# Patient Record
Sex: Male | Born: 1950 | Race: White | Hispanic: No | Marital: Single | Smoking: Former smoker
Health system: Southern US, Community
[De-identification: ages and names within clinical notes are randomized; demographics above are authoritative.]

## PROBLEM LIST (undated history)

## (undated) DIAGNOSIS — F1910 Other psychoactive substance abuse, uncomplicated: Secondary | ICD-10-CM

## (undated) DIAGNOSIS — T7840XA Allergy, unspecified, initial encounter: Secondary | ICD-10-CM

## (undated) DIAGNOSIS — M170 Bilateral primary osteoarthritis of knee: Secondary | ICD-10-CM

## (undated) DIAGNOSIS — I2510 Atherosclerotic heart disease of native coronary artery without angina pectoris: Secondary | ICD-10-CM

## (undated) DIAGNOSIS — B20 Human immunodeficiency virus [HIV] disease: Secondary | ICD-10-CM

## (undated) DIAGNOSIS — J4520 Mild intermittent asthma, uncomplicated: Secondary | ICD-10-CM

## (undated) DIAGNOSIS — J45909 Unspecified asthma, uncomplicated: Secondary | ICD-10-CM

## (undated) DIAGNOSIS — M84352A Stress fracture, left femur, initial encounter for fracture: Secondary | ICD-10-CM

## (undated) DIAGNOSIS — I219 Acute myocardial infarction, unspecified: Secondary | ICD-10-CM

## (undated) DIAGNOSIS — E785 Hyperlipidemia, unspecified: Secondary | ICD-10-CM

## (undated) DIAGNOSIS — F1510 Other stimulant abuse, uncomplicated: Secondary | ICD-10-CM

## (undated) DIAGNOSIS — Z21 Asymptomatic human immunodeficiency virus [HIV] infection status: Secondary | ICD-10-CM

## (undated) DIAGNOSIS — K4090 Unilateral inguinal hernia, without obstruction or gangrene, not specified as recurrent: Principal | ICD-10-CM

## (undated) HISTORY — DX: Atherosclerotic heart disease of native coronary artery without angina pectoris: I25.10

## (undated) HISTORY — DX: Bilateral primary osteoarthritis of knee: M17.0

## (undated) HISTORY — DX: Acute myocardial infarction, unspecified: I21.9

## (undated) HISTORY — DX: Unspecified asthma, uncomplicated: J45.909

## (undated) HISTORY — DX: Other psychoactive substance abuse, uncomplicated: F19.10

## (undated) HISTORY — DX: Asymptomatic human immunodeficiency virus (hiv) infection status: Z21

## (undated) HISTORY — PX: HERNIA REPAIR: SHX51

## (undated) HISTORY — PX: TONSILLECTOMY: SUR1361

## (undated) HISTORY — DX: Mild intermittent asthma, uncomplicated: J45.20

## (undated) HISTORY — DX: Hyperlipidemia, unspecified: E78.5

## (undated) HISTORY — DX: Other stimulant abuse, uncomplicated: F15.10

## (undated) HISTORY — DX: Human immunodeficiency virus (HIV) disease: B20

## (undated) HISTORY — DX: Allergy, unspecified, initial encounter: T78.40XA

## (undated) HISTORY — DX: Unilateral inguinal hernia, without obstruction or gangrene, not specified as recurrent: K40.90

---
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
## 2002-12-11 DIAGNOSIS — I219 Acute myocardial infarction, unspecified: Secondary | ICD-10-CM

## 2002-12-11 HISTORY — DX: Acute myocardial infarction, unspecified: I21.9

## 2002-12-11 HISTORY — PX: BYPASS GRAFT: SHX909

## 2002-12-11 HISTORY — PX: CORONARY ARTERY BYPASS GRAFT: SHX141

## 2011-01-11 HISTORY — PX: COLONOSCOPY: SHX174

## 2011-01-19 ENCOUNTER — Ambulatory Visit (HOSPITAL_COMMUNITY)
Admission: RE | Admit: 2011-01-19 | Discharge: 2011-01-19 | Disposition: A | Discharge status: Home / Self Care | Payer: BC Managed Care – PPO | Source: Ambulatory Visit | Attending: Gastroenterology | Admitting: Gastroenterology

## 2011-01-19 DIAGNOSIS — I252 Old myocardial infarction: Secondary | ICD-10-CM | POA: Insufficient documentation

## 2011-01-19 DIAGNOSIS — Z951 Presence of aortocoronary bypass graft: Secondary | ICD-10-CM | POA: Insufficient documentation

## 2011-01-19 DIAGNOSIS — E7800 Pure hypercholesterolemia, unspecified: Secondary | ICD-10-CM | POA: Insufficient documentation

## 2011-01-19 DIAGNOSIS — Z1211 Encounter for screening for malignant neoplasm of colon: Secondary | ICD-10-CM | POA: Insufficient documentation

## 2011-01-19 DIAGNOSIS — Z800 Family history of malignant neoplasm of digestive organs: Secondary | ICD-10-CM | POA: Insufficient documentation

## 2011-01-19 DIAGNOSIS — I10 Essential (primary) hypertension: Secondary | ICD-10-CM | POA: Insufficient documentation

## 2011-01-19 LAB — HM COLONOSCOPY

## 2011-01-24 NOTE — Op Note (Signed)
  Edwin Padilla, STARY NO.:  [id number]  MEDICAL RECORD NO.:  [id number]           PATIENT TYPE:  O  LOCATION:  [facility]                         FACILITY:  [facility]  PHYSICIAN:  Danise Edge, M.D.   DATE OF BIRTH:  01-07-1951  DATE OF PROCEDURE:  01/19/2011 DATE OF DISCHARGE:                              OPERATIVE REPORT   REFERRING PHYSICIAN:  Elby Showers, MD.  HISTORY:  Mr. Shaman Muscarella is a 60 year old male who is scheduled to undergo his first screening colonoscopy with polypectomy to prevent colon cancer.  ENDOSCOPIST:  Danise Edge, M.D.  PREMEDICATIONS: 1. Fentanyl 100 mcg. 2. Versed 7 mg.  PROCEDURE:  After obtaining informed consent, the patient was placed in the left lateral decubitus position.  I administered intravenous fentanyl and intravenous Versed to achieve conscious sedation for the procedure.  The patient's blood pressure, oxygen saturation and cardiac rhythm were monitored throughout the procedure and documented in the medical record.  Anal inspection and digital rectal exam were normal.  The Pentax pediatric colonoscope was introduced into the rectum and easily advanced to the cecum.  A normal-appearing appendiceal orifice and ileocecal valve were identified.  Colonic preparation for the exam today was good.  Rectum normal.  Retroflexed view of the distal rectum normal.  Sigmoid colon and descending colon normal.  Splenic flexure normal.  Transverse colon normal.  Hepatic flexure normal.  Ascending colon normal.  Cecum and ileocecal valve normal.  ASSESSMENT:  Normal screening proctocolonoscopy to the cecum.  RECOMMENDATIONS:  Schedule repeat screening colonoscopy in 10 years.          ______________________________ Danise Edge, M.D.     MJ/MEDQ  D:  01/19/2011  T:  01/19/2011  Job:  [id number]  cc:   Elby Showers, MD Fax: [phone]  Electronically Signed by Danise Edge M.D. on 01/24/2011 02:38:26 PM

## 2011-08-03 ENCOUNTER — Ambulatory Visit (INDEPENDENT_AMBULATORY_CARE_PROVIDER_SITE_OTHER): Payer: BC Managed Care – PPO

## 2011-08-03 DIAGNOSIS — Z111 Encounter for screening for respiratory tuberculosis: Secondary | ICD-10-CM

## 2011-08-03 DIAGNOSIS — Z23 Encounter for immunization: Secondary | ICD-10-CM

## 2011-08-03 DIAGNOSIS — B20 Human immunodeficiency virus [HIV] disease: Secondary | ICD-10-CM

## 2011-08-03 LAB — LIPID PANEL
Cholesterol: 165 mg/dL (ref 0–200)
HDL: 53 mg/dL (ref >39)
Triglycerides: 159 mg/dL — ABNORMAL HIGH (ref <150)

## 2011-08-03 LAB — URINALYSIS, ROUTINE W REFLEX MICROSCOPIC
Leukocytes, UA: NEGATIVE
Nitrite: NEGATIVE
Specific Gravity, Urine: 1.028 (ref 1.005–1.030)
Urobilinogen, UA: 0.2 mg/dL (ref 0.0–1.0)
pH: 6 (ref 5.0–8.0)

## 2011-08-03 LAB — COMPLETE METABOLIC PANEL WITH GFR
ALT: 29 U/L (ref 0–53)
AST: 23 U/L (ref 0–37)
Albumin: 4.5 g/dL (ref 3.5–5.2)
BUN: 16 mg/dL (ref 6–23)
Calcium: 9.7 mg/dL (ref 8.4–10.5)
Chloride: 104 mEq/L (ref 96–112)
Potassium: 4.3 mEq/L (ref 3.5–5.3)
Sodium: 139 mEq/L (ref 135–145)
Total Protein: 6.8 g/dL (ref 6.0–8.3)

## 2011-08-03 LAB — HEPATITIS B SURFACE ANTIBODY,QUALITATIVE

## 2011-08-04 LAB — GC/CHLAMYDIA PROBE AMP, URINE
Chlamydia, Swab/Urine, PCR: NEGATIVE
GC Probe Amp, Urine: NEGATIVE

## 2011-08-04 LAB — T-HELPER CELL (CD4) - (RCID CLINIC ONLY)
CD4 % Helper T Cell: 40 % (ref 33–55)
CD4 T Cell Abs: 1080 uL (ref 400–2700)

## 2011-08-04 LAB — HIV-1 RNA ULTRAQUANT REFLEX TO GENTYP+: HIV 1 RNA Quant: <20 copies/mL (ref <20)

## 2011-08-16 NOTE — Progress Notes (Signed)
Previous ID patient at [facility]. He is transferring since he lives in [place].   Taking Atripla 2005 , states he is very compliant.  Upon initial diagnosis pt was enrolled in a study for 2 years.

## 2011-08-28 ENCOUNTER — Encounter: Payer: Self-pay | Admitting: Infectious Disease

## 2011-08-28 ENCOUNTER — Ambulatory Visit (INDEPENDENT_AMBULATORY_CARE_PROVIDER_SITE_OTHER): Payer: BC Managed Care – PPO | Admitting: Infectious Disease

## 2011-08-28 DIAGNOSIS — F1510 Other stimulant abuse, uncomplicated: Secondary | ICD-10-CM | POA: Insufficient documentation

## 2011-08-28 DIAGNOSIS — B20 Human immunodeficiency virus [HIV] disease: Secondary | ICD-10-CM | POA: Insufficient documentation

## 2011-08-28 DIAGNOSIS — E785 Hyperlipidemia, unspecified: Secondary | ICD-10-CM

## 2011-08-28 DIAGNOSIS — Z21 Asymptomatic human immunodeficiency virus [HIV] infection status: Secondary | ICD-10-CM

## 2011-08-28 DIAGNOSIS — IMO0001 Reserved for inherently not codable concepts without codable children: Secondary | ICD-10-CM

## 2011-08-28 HISTORY — DX: Other stimulant abuse, uncomplicated: F15.10

## 2011-08-28 NOTE — Progress Notes (Signed)
Subjective:    Patient ID: Edwin Padilla, male    DOB: [birth year], 60 y.o.   MRN: [id number]  HPI  60 year old with HIV diagnosed in 2003 in [place], [place] hospital with acute HIV. He was put into a study and begun on immediate  Therapy with treatment interupption and then with high viral road rebound immediately upon stopping the meds. He was on a complicated regimen. He has been on [place] then [place] since 2005 with suppressed viral load, and high cd4 count. He never was able to get the "dreams to go away" from [employer] and he continues to have poor sleep. He also is distressed by the financial aspects. He has deductible of 5k with current plan and 50% copay on med s until he meets his deductible. 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 He was recently released from [employer] prison for selling methamphetamines. He is in [place] and been free of drugs includign tobacco. He  has been following closely with Dr. Clent Ridges. We reviewed other single tablet regimens including complera and stribild today but he wished to stay with atripla for now. We reviewed all llabs including liipids and he was thrilled to see his ldl at 80. He is living alone and is not sexually active. We spent greater than 45 minutes including greater than 50% of time in face to face counselling of the patient Review of Systems  Constitutional: Negative for fever, chills, diaphoresis, activity change, appetite change, fatigue and unexpected weight change.  HENT: Negative for congestion, sore throat, rhinorrhea, sneezing, trouble swallowing and sinus pressure.   Eyes: Negative for photophobia and visual disturbance.  Respiratory: Negative for cough, chest tightness, shortness of breath, wheezing and stridor.   Cardiovascular: Negative for chest pain, palpitations and leg swelling.  Gastrointestinal: Negative for nausea, vomiting, abdominal pain, diarrhea, constipation, blood in stool, abdominal  distention and anal bleeding.  Genitourinary: Negative for dysuria, hematuria, flank pain and difficulty urinating.  Musculoskeletal: Negative for myalgias, back pain, joint swelling, arthralgias and gait problem.  Skin: Negative for color change, pallor, rash and wound.  Neurological: Negative for dizziness, tremors, weakness and light-headedness.  Hematological: Negative for adenopathy. Does not bruise/bleed easily.  Psychiatric/Behavioral: Negative for behavioral problems, confusion, sleep disturbance, dysphoric mood, decreased concentration and agitation.       Objective:   Physical Exam  Constitutional: He is oriented to person, place, and time. He appears well-developed and well-nourished. No distress.  HENT:  Head: Normocephalic and atraumatic.  Mouth/Throat: Oropharynx is clear and moist. No oropharyngeal exudate.  Eyes: Conjunctivae and EOM are normal. Pupils are equal, round, and reactive to light. No scleral icterus.  Neck: Normal range of motion. Neck supple. No JVD present.  Cardiovascular: Normal rate, regular rhythm and normal heart sounds.  Exam reveals no gallop and no friction rub.   No murmur heard. Pulmonary/Chest: Effort normal and breath sounds normal. No respiratory distress. He has no wheezes. He has no rales. He exhibits no tenderness.  Abdominal: He exhibits no distension and no mass. There is no tenderness. There is no rebound and no guarding.  Musculoskeletal: He exhibits no edema and no tenderness.  Lymphadenopathy:    He has no cervical adenopathy.  Neurological: He is alert and oriented to person, place, and time. He has normal reflexes. He exhibits normal muscle tone. Coordination normal.  Skin: Skin is warm and dry. He is not diaphoretic. No erythema. No pallor.  Psychiatric: He  has a normal mood and affect. His behavior is normal. Judgment and thought content normal.          Assessment & Plan:  HIV (human immunodeficiency virus  infection) Continue atripla will consider other single tablet regimens in future if he remains interested  Hyperlipidemia Well controlled. Efavirenz does decrease the levels of simvastatin  Methamphetamine abuse In remission  Incarceration Seems to be doing very well and well adjusted post protracted stay in prison

## 2011-08-28 NOTE — Assessment & Plan Note (Signed)
In remission.

## 2011-08-28 NOTE — Assessment & Plan Note (Signed)
Seems to be doing very well and well adjusted post protracted stay in prison

## 2011-08-28 NOTE — Assessment & Plan Note (Signed)
Well controlled. Efavirenz does decrease the levels of simvastatin

## 2011-08-28 NOTE — Assessment & Plan Note (Signed)
Continue Edwin Padilla will consider other single tablet regimens in future if he remains interested

## 2012-05-14 ENCOUNTER — Telehealth: Payer: Self-pay | Admitting: *Deleted

## 2012-05-14 NOTE — Telephone Encounter (Signed)
I left him a message asking him to call & make an appointment

## 2012-05-14 NOTE — Telephone Encounter (Signed)
He called back & agreed to make an appt. transferred to [facility]

## 2012-07-09 ENCOUNTER — Ambulatory Visit (INDEPENDENT_AMBULATORY_CARE_PROVIDER_SITE_OTHER): Payer: BC Managed Care – PPO | Admitting: Surgery

## 2012-07-09 ENCOUNTER — Encounter (INDEPENDENT_AMBULATORY_CARE_PROVIDER_SITE_OTHER): Payer: Self-pay | Admitting: Surgery

## 2012-07-09 VITALS — BP 110/62 | HR 60 | Temp 98.2°F | Resp 16 | Ht 70.0 in | Wt 159.6 lb

## 2012-07-09 DIAGNOSIS — K4090 Unilateral inguinal hernia, without obstruction or gangrene, not specified as recurrent: Secondary | ICD-10-CM | POA: Insufficient documentation

## 2012-07-09 NOTE — Progress Notes (Signed)
[name] DOB: February 23, 1951 MRN: [id number]                                                                                      DATE: 07/09/2012  PCP: Elby Showers, MD Referring Provider: Elby Showers, MD  IMPRESSION:  Right inguinal hernia, currently asymptomatic, reducible, probably direct.  PLAN:   I reviewed the alternatives with him. I don't feel strongly that it needs to be repaired currently I did tell him that there is a small risk of incarceration. I told him that if it enlarges or becomes painful we should Definitely consider surgery. I suggested that he do all his normal activities and did determine if the hernia is giving him enough symptoms to warrant treatment. He is using a brace.   CC:  Chief Complaint  Patient presents with  . Other    eval rt ing hernia    HPI:  Edwin Padilla is a 61 y.o.  male who presents for evaluation of Right inguinal hernia. He thinks it developed when he had bronchitis a few months ago and was doing a lot of coughing. There is intermittently a bulge but it easily reduces when he lays back down. There is no pain. He hasn't had any problems with any of his activities although he has avoided playing tennis for a while.  PMH:  has a past medical history of Allergy; Coronary atherosclerosis of unspecified type of vessel, native or graft; Myocardial infarction (2004); HIV infection; Hyperlipidemia; Substance abuse; and Right inguinal hernia (07/09/2012).  PSH:   has past surgical history that includes Coronary artery bypass graft and Hernia repair.  ALLERGIES:  No Known Allergies  MEDICATIONS: Current outpatient prescriptions:aspirin 81 MG chewable tablet, Chew 81 mg by mouth daily.  , Disp: , Rfl: ;  ATRIPLA 600-200-300 MG per tablet, , Disp: , Rfl: ;  lisinopril (PRINIVIL,ZESTRIL) 5 MG tablet, , Disp: , Rfl: ;  pravastatin (PRAVACHOL) 40 MG tablet, Take 40 mg by mouth Once daily., Disp: , Rfl: ;  PROCTOZONE-HC 2.5 % rectal cream, , Disp: ,  Rfl: ;  simvastatin (ZOCOR) 40 MG tablet, , Disp: , Rfl:   ROS: He has filled out our 12 point review of systems and it is negative. EXAM:   . Vital signs BP 110/62  Pulse 60  Temp 98.2 F (36.8 C) (Temporal)  Resp 16  Ht 5\' 10"  (1.778 m)  Wt 159 lb 9.6 oz (72.394 kg)  BMI 22.90 kg/m2 General: The patient is thin but healthy-appearing, no acute distress Abdomen: He has a right inguinal hernia, but no obvious pulse 90 standing up but palpable on exam. There is no left femoral hernia. Testes appear normal. No other abnormality in the abdomen noted.  DATA REVIEWED:  I reviewed over the notes from his primary care physician which are now scanned into Epic    Lylah Lantis J 07/09/2012  CC: Elby Showers, MD, Elby Showers, MD

## 2012-07-09 NOTE — Patient Instructions (Signed)
I think you should have normal activities with no restrictions for now. If the hernia then  Begins to give you some discomfort or seems to be getting larger then you should call me and we should discuss again having a surgical repair.

## 2012-08-01 ENCOUNTER — Other Ambulatory Visit (INDEPENDENT_AMBULATORY_CARE_PROVIDER_SITE_OTHER): Payer: BC Managed Care – PPO | Admitting: Infectious Disease

## 2012-08-01 ENCOUNTER — Other Ambulatory Visit: Payer: BC Managed Care – PPO

## 2012-08-01 DIAGNOSIS — B20 Human immunodeficiency virus [HIV] disease: Secondary | ICD-10-CM

## 2012-08-01 DIAGNOSIS — Z113 Encounter for screening for infections with a predominantly sexual mode of transmission: Secondary | ICD-10-CM

## 2012-08-01 LAB — COMPLETE METABOLIC PANEL WITH GFR
AST: 19 U/L (ref 0–37)
Albumin: 3.9 g/dL (ref 3.5–5.2)
Alkaline Phosphatase: 70 U/L (ref 39–117)
BUN: 20 mg/dL (ref 6–23)
Calcium: 9.1 mg/dL (ref 8.4–10.5)
Chloride: 106 mEq/L (ref 96–112)
Potassium: 4.6 mEq/L (ref 3.5–5.3)
Sodium: 140 mEq/L (ref 135–145)
Total Protein: 6.3 g/dL (ref 6.0–8.3)

## 2012-08-01 LAB — CBC WITH DIFFERENTIAL/PLATELET
Basophils Absolute: 0 10*3/uL (ref 0.0–0.1)
Basophils Relative: 0 % (ref 0–1)
HCT: 39.2 % (ref 39.0–52.0)
Hemoglobin: 14 g/dL (ref 13.0–17.0)
Lymphocytes Relative: 37 % (ref 12–46)
MCHC: 35.7 g/dL (ref 30.0–36.0)
Monocytes Absolute: 0.6 10*3/uL (ref 0.1–1.0)
Monocytes Relative: 9 % (ref 3–12)
Neutro Abs: 2.9 10*3/uL (ref 1.7–7.7)
Neutrophils Relative %: 47 % (ref 43–77)
WBC: 6.3 10*3/uL (ref 4.0–10.5)

## 2012-08-02 LAB — T-HELPER CELL (CD4) - (RCID CLINIC ONLY)
CD4 % Helper T Cell: 35 % (ref 33–55)
CD4 T Cell Abs: 820 uL (ref 400–2700)

## 2012-08-07 ENCOUNTER — Ambulatory Visit: Payer: BC Managed Care – PPO | Admitting: Infectious Disease

## 2012-08-15 ENCOUNTER — Ambulatory Visit (INDEPENDENT_AMBULATORY_CARE_PROVIDER_SITE_OTHER): Payer: BC Managed Care – PPO | Admitting: Infectious Disease

## 2012-08-15 ENCOUNTER — Encounter: Payer: Self-pay | Admitting: Infectious Disease

## 2012-08-15 VITALS — BP 115/72 | HR 57 | Temp 97.5°F | Ht 70.0 in | Wt 163.5 lb

## 2012-08-15 DIAGNOSIS — I25810 Atherosclerosis of coronary artery bypass graft(s) without angina pectoris: Secondary | ICD-10-CM

## 2012-08-15 DIAGNOSIS — Z21 Asymptomatic human immunodeficiency virus [HIV] infection status: Secondary | ICD-10-CM

## 2012-08-15 DIAGNOSIS — B20 Human immunodeficiency virus [HIV] disease: Secondary | ICD-10-CM

## 2012-08-15 NOTE — Assessment & Plan Note (Signed)
On an aspirin and ACE inhibitor while his pravastatin is currently being held.

## 2012-08-15 NOTE — Assessment & Plan Note (Signed)
Perfect control 

## 2012-08-15 NOTE — Progress Notes (Signed)
  Subjective:    Patient ID: Edwin Padilla, male    DOB: 1951/03/20, 61 y.o.   MRN: [id number]  HPI  [name] is a 61 y.o. male who is doing superbly well on his  antiviral regimen, on atripla with undetectable viral load and health cd4 count. He is seeing Elby Showers for primary care and is currently off of his pravastatin to see if this may normalize some myalgias and what muscle weakness and easy been experiencing. He remains on lisinopril though his blood pressures are normal. He states that he was told he needed to remain on this drug after he underwent coronary artery bypass grafting in [place]. He is taking an aspirin. He claims he is not sexually active and not been so for several years. I recommended him having repeat hepatitis A vaccination given that his serologies were negative here at [facility].   Review of Systems  Constitutional: Negative for fever, chills, diaphoresis, activity change, appetite change, fatigue and unexpected weight change.  HENT: Negative for congestion, sore throat, rhinorrhea, sneezing, trouble swallowing and sinus pressure.   Eyes: Negative for photophobia and visual disturbance.  Respiratory: Negative for cough, chest tightness, shortness of breath, wheezing and stridor.   Cardiovascular: Negative for chest pain, palpitations and leg swelling.  Gastrointestinal: Negative for nausea, vomiting, abdominal pain, diarrhea, constipation, blood in stool, abdominal distention and anal bleeding.  Genitourinary: Negative for dysuria, hematuria, flank pain and difficulty urinating.  Musculoskeletal: Negative for myalgias, back pain, joint swelling, arthralgias and gait problem.  Skin: Negative for color change, pallor, rash and wound.  Neurological: Positive for weakness. Negative for dizziness, tremors and light-headedness.  Hematological: Negative for adenopathy. Does not bruise/bleed easily.  Psychiatric/Behavioral: Negative for behavioral problems,  confusion, disturbed wake/sleep cycle, dysphoric mood, decreased concentration and agitation.       Objective:   Physical Exam  Constitutional: He is oriented to person, place, and time. He appears well-developed and well-nourished. No distress.  HENT:  Head: Normocephalic and atraumatic.  Mouth/Throat: Oropharynx is clear and moist. No oropharyngeal exudate.  Eyes: Conjunctivae and EOM are normal. Pupils are equal, round, and reactive to light. No scleral icterus.  Neck: Normal range of motion. Neck supple. No JVD present.  Cardiovascular: Normal rate, regular rhythm and normal heart sounds.  Exam reveals no gallop and no friction rub.   No murmur heard. Pulmonary/Chest: Effort normal and breath sounds normal. No respiratory distress. He has no wheezes. He has no rales. He exhibits no tenderness.  Abdominal: He exhibits no distension and no mass. There is no tenderness. There is no rebound and no guarding.  Musculoskeletal: He exhibits no edema and no tenderness.  Lymphadenopathy:    He has no cervical adenopathy.  Neurological: He is alert and oriented to person, place, and time. He has normal reflexes. He exhibits normal muscle tone. Coordination normal.  Skin: Skin is warm and dry. He is not diaphoretic. No erythema. No pallor.  Psychiatric: He has a normal mood and affect. His behavior is normal. Judgment and thought content normal.          Assessment & Plan:  HIV (human immunodeficiency virus infection) Perfect control  CAD (coronary artery disease) of artery bypass graft On an aspirin and ACE inhibitor while his pravastatin is currently being held.

## 2012-09-04 DIAGNOSIS — Z23 Encounter for immunization: Secondary | ICD-10-CM

## 2013-01-29 ENCOUNTER — Other Ambulatory Visit: Payer: Self-pay

## 2013-01-29 ENCOUNTER — Other Ambulatory Visit: Payer: Self-pay | Admitting: Infectious Disease

## 2013-01-29 ENCOUNTER — Other Ambulatory Visit (INDEPENDENT_AMBULATORY_CARE_PROVIDER_SITE_OTHER): Payer: BC Managed Care – PPO

## 2013-01-29 DIAGNOSIS — B20 Human immunodeficiency virus [HIV] disease: Secondary | ICD-10-CM

## 2013-01-29 DIAGNOSIS — Z23 Encounter for immunization: Secondary | ICD-10-CM

## 2013-01-29 DIAGNOSIS — Z21 Asymptomatic human immunodeficiency virus [HIV] infection status: Secondary | ICD-10-CM

## 2013-01-29 LAB — LIPID PANEL
Cholesterol: 187 mg/dL (ref 0–200)
LDL Cholesterol: 117 mg/dL — ABNORMAL HIGH (ref 0–99)
Total CHOL/HDL Ratio: 3.7 Ratio
VLDL: 20 mg/dL (ref 0–40)

## 2013-01-29 LAB — COMPLETE METABOLIC PANEL WITH GFR
ALT: 33 U/L (ref 0–53)
AST: 21 U/L (ref 0–37)
Alkaline Phosphatase: 72 U/L (ref 39–117)
Creat: 0.9 mg/dL (ref 0.50–1.35)
GFR, Est African American: >89 mL/min
Sodium: 137 mEq/L (ref 135–145)
Total Bilirubin: 0.3 mg/dL (ref 0.3–1.2)
Total Protein: 6.2 g/dL (ref 6.0–8.3)

## 2013-01-29 LAB — CBC WITH DIFFERENTIAL/PLATELET
Eosinophils Absolute: 0.5 10*3/uL (ref 0.0–0.7)
HCT: 39.7 % (ref 39.0–52.0)
Hemoglobin: 14.2 g/dL (ref 13.0–17.0)
Lymphs Abs: 2.2 10*3/uL (ref 0.7–4.0)
MCH: 31.3 pg (ref 26.0–34.0)
MCV: 87.6 fL (ref 78.0–100.0)
Monocytes Absolute: 0.7 10*3/uL (ref 0.1–1.0)
Monocytes Relative: 9 % (ref 3–12)
Neutrophils Relative %: 55 % (ref 43–77)
RBC: 4.53 MIL/uL (ref 4.22–5.81)

## 2013-01-30 LAB — T-HELPER CELL (CD4) - (RCID CLINIC ONLY)
CD4 % Helper T Cell: 36 % (ref 33–55)
CD4 T Cell Abs: 830 uL (ref 400–2700)

## 2013-01-30 LAB — RPR

## 2013-01-31 LAB — HIV-1 RNA QUANT-NO REFLEX-BLD
HIV 1 RNA Quant: <20 copies/mL (ref <20)
HIV-1 RNA Quant, Log: <1.3 {Log} (ref <1.30)

## 2013-02-05 ENCOUNTER — Encounter: Payer: Self-pay | Admitting: Infectious Disease

## 2013-02-12 ENCOUNTER — Ambulatory Visit: Payer: Self-pay | Admitting: Infectious Disease

## 2013-02-12 ENCOUNTER — Ambulatory Visit: Payer: BC Managed Care – PPO | Admitting: Infectious Disease

## 2013-02-20 ENCOUNTER — Ambulatory Visit (INDEPENDENT_AMBULATORY_CARE_PROVIDER_SITE_OTHER): Payer: BC Managed Care – PPO | Admitting: Infectious Disease

## 2013-02-20 ENCOUNTER — Encounter: Payer: Self-pay | Admitting: Infectious Disease

## 2013-02-20 VITALS — BP 111/70 | HR 51 | Temp 98.1°F | Wt 164.8 lb

## 2013-02-20 DIAGNOSIS — I25810 Atherosclerosis of coronary artery bypass graft(s) without angina pectoris: Secondary | ICD-10-CM

## 2013-02-20 DIAGNOSIS — E7800 Pure hypercholesterolemia, unspecified: Secondary | ICD-10-CM

## 2013-02-20 DIAGNOSIS — Z951 Presence of aortocoronary bypass graft: Secondary | ICD-10-CM

## 2013-02-20 DIAGNOSIS — M791 Myalgia, unspecified site: Secondary | ICD-10-CM

## 2013-02-20 DIAGNOSIS — Z21 Asymptomatic human immunodeficiency virus [HIV] infection status: Secondary | ICD-10-CM

## 2013-02-20 DIAGNOSIS — I10 Essential (primary) hypertension: Secondary | ICD-10-CM

## 2013-02-20 DIAGNOSIS — B20 Human immunodeficiency virus [HIV] disease: Secondary | ICD-10-CM

## 2013-02-20 DIAGNOSIS — E785 Hyperlipidemia, unspecified: Secondary | ICD-10-CM

## 2013-02-20 DIAGNOSIS — IMO0001 Reserved for inherently not codable concepts without codable children: Secondary | ICD-10-CM

## 2013-02-20 MED ORDER — ATORVASTATIN CALCIUM 40 MG PO TABS: 40.0000 mg | ORAL_TABLET | Freq: Every day | ORAL | Status: DC

## 2013-02-20 NOTE — Patient Instructions (Addendum)
I would recommend Sharlot Gowda for an MD who is PCP and also has excellent experience with HIV  His practice address is :  [address] [place], [place] [address] [facility]: Ronnald Nian [id number] [place], [place] [address]  Phone number  [phone]  We would be happy to make referral to him for you

## 2013-02-20 NOTE — Progress Notes (Signed)
  Subjective:    Patient ID: Edwin Padilla, male    DOB: Feb 25, 1951, 62 y.o.   MRN: [id number]  HPI   Edwin Padilla is a 62 y.o. male who is doing superbly well on his  antiviral regimen, on atripla with undetectable viral load and health cd4 count. He was  seeing Elby Showers for primary care and was taken off of his pravastatin to see if this may normalize some myalgias when I last saw him but appears to be back on pravachol at present.  He was interested in a different PCP who is more familiar with HIV than Dr Nehemiah Settle towhom he was referred after Dr Clent Ridges had moved to [place].  I spent greater than 45 minutes with the patient including greater than 50% of time in face to face counsel of the patient and in coordination of their care.     Review of Systems  Constitutional: Negative for fever, chills, diaphoresis, activity change, appetite change, fatigue and unexpected weight change.  HENT: Negative for congestion, sore throat, rhinorrhea, sneezing, trouble swallowing and sinus pressure.   Eyes: Negative for photophobia and visual disturbance.  Respiratory: Negative for cough, chest tightness, shortness of breath, wheezing and stridor.   Cardiovascular: Negative for chest pain, palpitations and leg swelling.  Gastrointestinal: Negative for nausea, vomiting, abdominal pain, diarrhea, constipation, blood in stool, abdominal distention and anal bleeding.  Genitourinary: Negative for dysuria, hematuria, flank pain and difficulty urinating.  Musculoskeletal: Negative for myalgias, back pain, joint swelling, arthralgias and gait problem.  Skin: Negative for color change, pallor, rash and wound.  Neurological: Positive for weakness. Negative for dizziness, tremors and light-headedness.  Hematological: Negative for adenopathy. Does not bruise/bleed easily.  Psychiatric/Behavioral: Negative for behavioral problems, confusion, sleep disturbance, dysphoric mood, decreased concentration and agitation.        Objective:   Physical Exam  Constitutional: He is oriented to person, place, and time. He appears well-developed and well-nourished. No distress.  HENT:  Head: Normocephalic and atraumatic.  Mouth/Throat: Oropharynx is clear and moist. No oropharyngeal exudate.  Eyes: Conjunctivae and EOM are normal. Pupils are equal, round, and reactive to light. No scleral icterus.  Neck: Normal range of motion. Neck supple. No JVD present.  Cardiovascular: Normal rate, regular rhythm and normal heart sounds.  Exam reveals no gallop and no friction rub.   No murmur heard. Pulmonary/Chest: Effort normal and breath sounds normal. No respiratory distress. He has no wheezes. He has no rales. He exhibits no tenderness.  Abdominal: He exhibits no distension and no mass. There is no tenderness. There is no rebound and no guarding.  Musculoskeletal: He exhibits no edema and no tenderness.  Lymphadenopathy:    He has no cervical adenopathy.  Neurological: He is alert and oriented to person, place, and time. He has normal reflexes. He exhibits normal muscle tone. Coordination normal.  Skin: Skin is warm and dry. He is not diaphoretic. No erythema. No pallor.  Psychiatric: He has a normal mood and affect. His behavior is normal. Judgment and thought content normal.          Assessment & Plan:   HIV: continue atripla  CAD: wrote for lipitor 40mg , he is on asa and ACEI  HTN : well controlled  Hx of myalgias: will call and double check that he really has been back on the pravachol without problems

## 2013-04-04 ENCOUNTER — Ambulatory Visit (INDEPENDENT_AMBULATORY_CARE_PROVIDER_SITE_OTHER): Payer: BC Managed Care – PPO | Admitting: Surgery

## 2013-04-04 ENCOUNTER — Encounter (HOSPITAL_BASED_OUTPATIENT_CLINIC_OR_DEPARTMENT_OTHER): Payer: Self-pay | Admitting: *Deleted

## 2013-04-04 ENCOUNTER — Encounter (INDEPENDENT_AMBULATORY_CARE_PROVIDER_SITE_OTHER): Payer: Self-pay | Admitting: Surgery

## 2013-04-04 VITALS — BP 112/74 | HR 56 | Temp 98.0°F | Resp 18 | Ht 70.0 in | Wt 163.0 lb

## 2013-04-04 DIAGNOSIS — K4090 Unilateral inguinal hernia, without obstruction or gangrene, not specified as recurrent: Secondary | ICD-10-CM

## 2013-04-04 NOTE — Progress Notes (Signed)
Pt to come in for [id number]-sees dr Katrinka Blazing for CAD post op CABG 2004-no problems since-has not had ekg this yr. Called for notes Pt also see [facility] dr Finis Bud

## 2013-04-04 NOTE — Patient Instructions (Signed)
We will schedule outpatient surgery to repair your right inguinal hernia

## 2013-04-04 NOTE — Progress Notes (Signed)
NAME: Edwin Padilla       DOB: 09/16/1951           DATE: 04/04/2013       MRN:[id number]  CC:  Chief Complaint  Patient presents with  . Inguinal Hernia    reck ing hernia discuss surgery LOV 07/09/12    HPI: I saw this patient about a year ago for a right inguinal hernia. At the time he was essentially asymptomatic and elected to defer repair. However, he is now had some more discomfort and thinks getting slightly larger like to go ahead and have it fixed. He believes his medical condition otherwise is very stable. His has a history of coronary disease but has been stable from that standpoint and has no cardiac symptoms whatsoever.  EXAM:   VITAL SIGNS: BP 112/74  Pulse 56  Temp(Src) 98 F (36.7 C) (Oral)  Resp 18  Ht 5' 10" (1.778 m)  Wt 163 lb (73.936 kg)  BMI 23.39 kg/m2  GENERAL:  The patient is alert, oriented, and generally healthy-appearing, NAD. Mood and affect are normal.  HEENT:  The head is normocephalic, the eyes nonicteric, the pupils were round regular and equal. EOMs are normal. Pharynx normal. Dentition good.  NECK:  The neck is supple and there are no masses or thyromegaly.  LUNGS:  Normal respirations and clear to auscultation.  HEART:  Regular rhythm, with no murmurs rubs or gallops. Pulses are intact carotid dorsalis pedis and posterior tibial. No significant varicosities are noted.  ABDOMEN:  Soft, flat, and nontender. No masses or organomegaly is noted. No hernias are noted. Bowel sounds are normal.  GU:  Testes are normal bilaterally. He has a reducible right anal hernia which I believe is likely direct. There is no left inguinal hernia.  EXTREMITIES:  Good range of motion, no edema.  IMP: gradually enlarging, minimally symptomatic, reducible right ankle hernia; HIV positive; history of coronary artery disease stable  PLAN: but to ahead and schedule inguinal hernia repair. We will make arrange for him to have that done. I have discussed with the  patient the fact that he has an inguinal hernia and reviewed the pathophysiology of that diagnosis. I have given him educational materials about this. I discussed options for treatment including observation or repair and have discussed both laparoscopic and open inguinal hernia repairs as potential techniques. We have discussed the use of mesh. We have discussed risks of surgery including bleeding, infection, recurrence, postoperative pain and possible chronic groin pain, testicular injury, and potential issues with voiding. I believe the patient's questions have been answered and that he has a good understanding of the issues  I will check with his cardiologist to be sure no additional cardiac workup is needed.  Chelcee Korpi J 04/04/2013   

## 2013-04-07 ENCOUNTER — Encounter (HOSPITAL_BASED_OUTPATIENT_CLINIC_OR_DEPARTMENT_OTHER)
Admission: RE | Admit: 2013-04-07 | Discharge: 2013-04-07 | Disposition: A | Discharge status: Home / Self Care | Payer: BC Managed Care – PPO | Source: Ambulatory Visit | Attending: Surgery | Admitting: Surgery

## 2013-04-07 LAB — BASIC METABOLIC PANEL
BUN: 20 mg/dL (ref 6–23)
Calcium: 8.9 mg/dL (ref 8.4–10.5)
Creatinine, Ser: 0.89 mg/dL (ref 0.50–1.35)
GFR calc Af Amer: >90 mL/min (ref >90)
GFR calc non Af Amer: >90 mL/min (ref >90)
Glucose, Bld: 75 mg/dL (ref 70–99)
Potassium: 4.4 mEq/L (ref 3.5–5.1)

## 2013-04-09 ENCOUNTER — Encounter (HOSPITAL_BASED_OUTPATIENT_CLINIC_OR_DEPARTMENT_OTHER): Payer: Self-pay | Admitting: *Deleted

## 2013-04-09 ENCOUNTER — Ambulatory Visit (HOSPITAL_BASED_OUTPATIENT_CLINIC_OR_DEPARTMENT_OTHER): Payer: BC Managed Care – PPO | Admitting: Certified Registered Nurse Anesthetist

## 2013-04-09 ENCOUNTER — Encounter (HOSPITAL_BASED_OUTPATIENT_CLINIC_OR_DEPARTMENT_OTHER): Payer: Self-pay | Admitting: Certified Registered Nurse Anesthetist

## 2013-04-09 ENCOUNTER — Encounter (HOSPITAL_BASED_OUTPATIENT_CLINIC_OR_DEPARTMENT_OTHER)
Admission: RE | Disposition: A | Discharge status: Home / Self Care | Payer: Self-pay | Source: Ambulatory Visit | Attending: Surgery

## 2013-04-09 ENCOUNTER — Ambulatory Visit (HOSPITAL_BASED_OUTPATIENT_CLINIC_OR_DEPARTMENT_OTHER)
Admission: RE | Admit: 2013-04-09 | Discharge: 2013-04-09 | Disposition: A | Discharge status: Home / Self Care | Payer: BC Managed Care – PPO | Source: Ambulatory Visit | Attending: Surgery | Admitting: Surgery

## 2013-04-09 DIAGNOSIS — K4090 Unilateral inguinal hernia, without obstruction or gangrene, not specified as recurrent: Secondary | ICD-10-CM

## 2013-04-09 DIAGNOSIS — Z21 Asymptomatic human immunodeficiency virus [HIV] infection status: Secondary | ICD-10-CM | POA: Insufficient documentation

## 2013-04-09 DIAGNOSIS — I252 Old myocardial infarction: Secondary | ICD-10-CM | POA: Insufficient documentation

## 2013-04-09 DIAGNOSIS — I2510 Atherosclerotic heart disease of native coronary artery without angina pectoris: Secondary | ICD-10-CM | POA: Insufficient documentation

## 2013-04-09 HISTORY — PX: INGUINAL HERNIA REPAIR: SHX194

## 2013-04-09 SURGERY — REPAIR, HERNIA, INGUINAL, ADULT
Anesthesia: General | Site: Groin | Laterality: Right | Wound class: Clean

## 2013-04-09 MED ORDER — CEFAZOLIN SODIUM-DEXTROSE 2-3 GM-% IV SOLR
2.0000 g | INTRAVENOUS | Status: AC
  Administered 2013-04-09: 2 g via INTRAVENOUS

## 2013-04-09 MED ORDER — CHLORHEXIDINE GLUCONATE 4 % EX LIQD: 1.0000 "application " | Freq: Once | CUTANEOUS | Status: DC

## 2013-04-09 MED ORDER — PROPOFOL 10 MG/ML IV BOLUS
INTRAVENOUS | Status: DC | PRN
  Administered 2013-04-09: 150 mg via INTRAVENOUS

## 2013-04-09 MED ORDER — BACITRACIN 50000 UNITS IM SOLR
INTRAMUSCULAR | Status: AC
  Filled 2013-04-09: qty 1

## 2013-04-09 MED ORDER — BUPIVACAINE-EPINEPHRINE PF 0.5-1:200000 % IJ SOLN
INTRAMUSCULAR | Status: DC | PRN
  Administered 2013-04-09: 25 mL

## 2013-04-09 MED ORDER — ONDANSETRON HCL 4 MG/2ML IJ SOLN
INTRAMUSCULAR | Status: DC | PRN
  Administered 2013-04-09: 4 mg via INTRAVENOUS

## 2013-04-09 MED ORDER — LIDOCAINE HCL (CARDIAC) 20 MG/ML IV SOLN
INTRAVENOUS | Status: DC | PRN
  Administered 2013-04-09: 80 mg via INTRAVENOUS

## 2013-04-09 MED ORDER — OXYCODONE HCL 5 MG PO TABS
5.0000 mg | ORAL_TABLET | Freq: Once | ORAL | Status: AC
  Administered 2013-04-09: 5 mg via ORAL

## 2013-04-09 MED ORDER — BUPIVACAINE HCL (PF) 0.25 % IJ SOLN
INTRAMUSCULAR | Status: DC | PRN
  Administered 2013-04-09: 20 mL

## 2013-04-09 MED ORDER — DEXAMETHASONE SODIUM PHOSPHATE 4 MG/ML IJ SOLN
INTRAMUSCULAR | Status: DC | PRN
  Administered 2013-04-09: 10 mg via INTRAVENOUS

## 2013-04-09 MED ORDER — SODIUM CHLORIDE 0.9 % IV SOLN
INTRAVENOUS | Status: AC
  Filled 2013-04-09: qty 500

## 2013-04-09 MED ORDER — FENTANYL CITRATE 0.05 MG/ML IJ SOLN
INTRAMUSCULAR | Status: DC | PRN
  Administered 2013-04-09 (×4): 25 ug via INTRAVENOUS

## 2013-04-09 MED ORDER — LACTATED RINGERS IV SOLN
INTRAVENOUS | Status: DC
  Administered 2013-04-09 (×2): via INTRAVENOUS

## 2013-04-09 MED ORDER — OXYCODONE-ACETAMINOPHEN 5-325 MG PO TABS: 1.0000 | ORAL_TABLET | ORAL | Status: DC | PRN

## 2013-04-09 MED ORDER — FENTANYL CITRATE 0.05 MG/ML IJ SOLN
50.0000 ug | INTRAMUSCULAR | Status: DC | PRN
  Administered 2013-04-09: 100 ug via INTRAVENOUS

## 2013-04-09 MED ORDER — MIDAZOLAM HCL 2 MG/2ML IJ SOLN
1.0000 mg | INTRAMUSCULAR | Status: DC | PRN
  Administered 2013-04-09: 2 mg via INTRAVENOUS

## 2013-04-09 SURGICAL SUPPLY — 40 items
BLADE HEX COATED 2.75 (ELECTRODE) ×2 IMPLANT
BLADE SURG 10 STRL SS (BLADE) ×2 IMPLANT
BLADE SURG ROTATE 9660 (MISCELLANEOUS) ×2 IMPLANT
CHLORAPREP W/TINT 26ML (MISCELLANEOUS) ×2 IMPLANT
CLIP TI WIDE RED SMALL 6 (CLIP) IMPLANT
CLOTH BEACON ORANGE TIMEOUT ST (SAFETY) ×2 IMPLANT
COVER MAYO STAND STRL (DRAPES) ×2 IMPLANT
COVER TABLE BACK 60X90 (DRAPES) ×2 IMPLANT
DECANTER SPIKE VIAL GLASS SM (MISCELLANEOUS) IMPLANT
DERMABOND ADVANCED (GAUZE/BANDAGES/DRESSINGS) ×1
DERMABOND ADVANCED .7 DNX12 (GAUZE/BANDAGES/DRESSINGS) ×1 IMPLANT
DRAIN PENROSE 1/2X12 LTX STRL (WOUND CARE) ×2 IMPLANT
DRAPE LAPAROTOMY TRNSV 102X78 (DRAPE) ×2 IMPLANT
DRAPE UTILITY XL STRL (DRAPES) ×2 IMPLANT
ELECT REM PT RETURN 9FT ADLT (ELECTROSURGICAL) ×2
ELECTRODE REM PT RTRN 9FT ADLT (ELECTROSURGICAL) ×1 IMPLANT
GLOVE BIO SURGEON STRL SZ 6.5 (GLOVE) ×4 IMPLANT
GLOVE BIOGEL PI IND STRL 6.5 (GLOVE) ×1 IMPLANT
GLOVE BIOGEL PI INDICATOR 6.5 (GLOVE) ×1
GLOVE EUDERMIC 7 POWDERFREE (GLOVE) ×2 IMPLANT
GOWN PREVENTION PLUS XLARGE (GOWN DISPOSABLE) ×4 IMPLANT
MESH HERNIA 3X6 (Mesh General) ×2 IMPLANT
NEEDLE HYPO 22GX1.5 SAFETY (NEEDLE) IMPLANT
NEEDLE HYPO 25X1 1.5 SAFETY (NEEDLE) ×2 IMPLANT
NS IRRIG 1000ML POUR BTL (IV SOLUTION) ×2 IMPLANT
PACK BASIN DAY SURGERY FS (CUSTOM PROCEDURE TRAY) ×2 IMPLANT
PENCIL BUTTON HOLSTER BLD 10FT (ELECTRODE) ×2 IMPLANT
SLEEVE SCD COMPRESS KNEE MED (MISCELLANEOUS) ×2 IMPLANT
SPONGE INTESTINAL PEANUT (DISPOSABLE) ×2 IMPLANT
SPONGE LAP 4X18 X RAY DECT (DISPOSABLE) ×2 IMPLANT
SUT MNCRL AB 4-0 PS2 18 (SUTURE) ×2 IMPLANT
SUT PROLENE 2 0 CT2 30 (SUTURE) ×6 IMPLANT
SUT SILK 2 0 SH (SUTURE) ×2 IMPLANT
SUT VIC AB 3-0 CT1 27 (SUTURE) ×2
SUT VIC AB 3-0 CT1 27XBRD (SUTURE) ×2 IMPLANT
SUT VIC AB 4-0 BRD 54 (SUTURE) ×2 IMPLANT
SYR CONTROL 10ML LL (SYRINGE) ×2 IMPLANT
TAPE HYPAFIX 4 X10 (GAUZE/BANDAGES/DRESSINGS) IMPLANT
TOWEL OR 17X24 6PK STRL BLUE (TOWEL DISPOSABLE) ×2 IMPLANT
TOWEL OR NON WOVEN STRL DISP B (DISPOSABLE) ×2 IMPLANT

## 2013-04-09 NOTE — Transfer of Care (Signed)
Immediate Anesthesia Transfer of Care Note  Patient: Edwin Padilla  Procedure(s) Performed: Procedure(s): RIGHT HERNIA REPAIR INGUINAL ADULT (Right)  Patient Location: PACU  Anesthesia Type:GA combined with regional for post-op pain  Level of Consciousness: sedated and patient cooperative  Airway & Oxygen Therapy: Patient Spontanous Breathing and Patient connected to face mask oxygen  Post-op Assessment: Report given to PACU RN and Post -op Vital signs reviewed and stable  Post vital signs: Reviewed and stable  Complications: No apparent anesthesia complications

## 2013-04-09 NOTE — H&P (View-Only) (Signed)
Edwin Padilla       DOB: March 02, 1951           DATE: 04/04/2013       WUJ:[id number]  CC:  Chief Complaint  Patient presents with  . Inguinal Hernia    reck ing hernia discuss surgery LOV 07/09/12    HPI: I saw this patient about a year ago for a right inguinal hernia. At the time he was essentially asymptomatic and elected to defer repair. However, he is now had some more discomfort and thinks getting slightly larger like to go ahead and have it fixed. He believes his medical condition otherwise is very stable. His has a history of coronary disease but has been stable from that standpoint and has no cardiac symptoms whatsoever.  EXAM:   VITAL SIGNS: BP 112/74  Pulse 56  Temp(Src) 98 F (36.7 C) (Oral)  Resp 18  Ht 5\' 10"  (1.778 m)  Wt 163 lb (73.936 kg)  BMI 23.39 kg/m2  GENERAL:  The patient is alert, oriented, and generally healthy-appearing, NAD. Mood and affect are normal.  HEENT:  The head is normocephalic, the eyes nonicteric, the pupils were round regular and equal. EOMs are normal. Pharynx normal. Dentition good.  NECK:  The neck is supple and there are no masses or thyromegaly.  LUNGS:  Normal respirations and clear to auscultation.  HEART:  Regular rhythm, with no murmurs rubs or gallops. Pulses are intact carotid dorsalis pedis and posterior tibial. No significant varicosities are noted.  ABDOMEN:  Soft, flat, and nontender. No masses or organomegaly is noted. No hernias are noted. Bowel sounds are normal.  GU:  Testes are normal bilaterally. He has a reducible right anal hernia which I believe is likely direct. There is no left inguinal hernia.  EXTREMITIES:  Good range of motion, no edema.  IMP: gradually enlarging, minimally symptomatic, reducible right ankle hernia; HIV positive; history of coronary artery disease stable  PLAN: but to ahead and schedule inguinal hernia repair. We will make arrange for him to have that done. I have discussed with the  patient the fact that he has an inguinal hernia and reviewed the pathophysiology of that diagnosis. I have given him educational materials about this. I discussed options for treatment including observation or repair and have discussed both laparoscopic and open inguinal hernia repairs as potential techniques. We have discussed the use of mesh. We have discussed risks of surgery including bleeding, infection, recurrence, postoperative pain and possible chronic groin pain, testicular injury, and potential issues with voiding. I believe the patient's questions have been answered and that he has a good understanding of the issues  I will check with his cardiologist to be sure no additional cardiac workup is needed.  Iveliz Garay J 04/04/2013

## 2013-04-09 NOTE — Progress Notes (Signed)
Assisted Dr. Ivin Booty with right, ultrasound guided, transabdominal plane block. Side rails up, monitors on throughout procedure. See vital signs in flow sheet.

## 2013-04-09 NOTE — Anesthesia Procedure Notes (Addendum)
Anesthesia Regional Block:  TAP block  Pre-Anesthetic Checklist: ,, timeout performed, Correct Patient, Correct Site, Correct Laterality, Correct Procedure, Correct Position, site marked, Risks and benefits discussed,  Surgical consent,  Pre-op evaluation,  At surgeon's request and post-op pain management  Laterality: Right and Lower  Prep: chloraprep       Needles:  Injection technique: Single-shot  Needle Type: Echogenic Needle     Needle Length: 9cm  Needle Gauge: 21    Additional Needles:  Procedures: ultrasound guided (picture in chart) TAP block Narrative:  Start time: 04/09/2013 11:58 AM End time: 04/09/2013 12:06 PM Injection made incrementally with aspirations every 5 mL.  Performed by: Personally  Anesthesiologist: Sheldon Silvan, MD  TAP block Procedure Name: LMA Insertion Date/Time: 04/09/2013 12:24 PM Performed by: Gar Gibbon Pre-anesthesia Checklist: Patient identified, Emergency Drugs available, Suction available and Patient being monitored Patient Re-evaluated:Patient Re-evaluated prior to inductionOxygen Delivery Method: Circle System Utilized Preoxygenation: Pre-oxygenation with 100% oxygen Intubation Type: IV induction Ventilation: Mask ventilation without difficulty LMA: LMA inserted LMA Size: 5.0 Number of attempts: 1 Airway Equipment and Method: bite block Placement Confirmation: positive ETCO2 Tube secured with: Tape Dental Injury: Teeth and Oropharynx as per pre-operative assessment

## 2013-04-09 NOTE — Op Note (Signed)
Edwin Padilla [facility] 06-12-1951 [id number] 04/04/2013  Preoperative diagnosis: Right inguinal hernia  Postoperative diagnosis: Same - sliding  Procedure: Repair of right inguinal hernia  Surgeon: Currie Paris, MD, FACS  Anesthesia:General  Clinical History and Indications: This patient has a RIH he desired repaired.   Description of Procedure:The patient was seen in the holding area and the plans for the procedure as noted above confirmed with the patient. We reviewed again the risks and complications and the patient had no further questions. I then marked the right  as the operative side. This was confirmed with the patient. He wishes to proceed.  The patient was taken to the operating room and after satisfactory general anesthesia was obtained the right  inguinal area was prepped and draped as a sterile field. A time out was done.  I used an equal mixture of 0.5% plain Marcaine and 1% Xylocaine with epinephrine for local anesthesia and to help with postoperative pain management. The area of the incision was infiltrated first as well as a field block going medially and inferiorly. I also injected some below the external oblique aponeurosis at the level of the anterior superior iliac spine.  An oblique incision was made and deepened to the external oblique aponeurosis. Bleeders were either cauterized or tied with 3-0 Vicryl. The external oblique aponeurosis was opened in the line of its fibers and elevated off of the underlying tissue. The ilioinguinal nerve was noted and protected.  The spermatic cord was then dissected up off of the inguinal floor and surrounded with a drain. The inguinal floor was very attenuated, but intact. There was what initially appeared to be an indirect hernia containing fat was identified. As I opened the sac it appeared this was a sliding hernia. The sac was closed and reduced completely. Some 2-0 Prolene was used to close the wide deep ring.  I then took some Bard  mesh and cut it to shape. It was anchored at the pubic tubercle and a running 2-0 Prolene used to suture it to the edge of the inferior shelving edge of the external oblique. It was split laterally to go around the cord and then laid gently over the internal oblique medially. Several more sutures of 2-0 Prolene were used to anchor the mesh to the internal oblique. The tails of the mesh were crossed lateral to the cord structures and deep ring and tacked together. The iliio-inguinal nerve wa divided as I thouoght it was going to be entraped by the closure of the deep ring and the mesh.  This appeared to produce a nice coverage and repair with no tension. There was adequate space for the cord structures to exit through the mesh and deep ring.  I checked to make sure everything was dry. Additional local had been infiltrated as I was working to be sure we had complete anesthesia of the entire operative field.  The incision was then closed with a running 3-0 Vicryl on the external oblique, closing it over the repair. Scarpa's fascia was closed with a running 3-0 Vicryl and the skin with a running 4-0 Monocryl subcuticular and Dermabond on the skin.  The patient tolerated the procedure well. There were no operative complications. There was minimal blood loss. All counts were correct.  Currie Paris, MD, FACS 04/09/2013 1:34 PM

## 2013-04-09 NOTE — Anesthesia Preprocedure Evaluation (Signed)
Anesthesia Evaluation  Patient identified by MRN, date of birth, ID band Patient awake    Reviewed: Allergy & Precautions, H&P , NPO status , Patient's Chart, lab work & pertinent test results  Airway Mallampati: I TM Distance: >3 FB Neck ROM: Full    Dental  (+) Teeth Intact and Dental Advisory Given   Pulmonary  breath sounds clear to auscultation        Cardiovascular + CAD and + Past MI Rhythm:Regular     Neuro/Psych    GI/Hepatic   Endo/Other    Renal/GU      Musculoskeletal   Abdominal   Peds  Hematology   Anesthesia Other Findings   Reproductive/Obstetrics                           Anesthesia Physical Anesthesia Plan  ASA: III  Anesthesia Plan: General   Post-op Pain Management:    Induction: Intravenous  Airway Management Planned: LMA  Additional Equipment:   Intra-op Plan:   Post-operative Plan: Extubation in OR  Informed Consent: I have reviewed the patients History and Physical, chart, labs and discussed the procedure including the risks, benefits and alternatives for the proposed anesthesia with the patient or authorized representative who has indicated his/her understanding and acceptance.   Dental advisory given  Plan Discussed with: CRNA, Anesthesiologist and Surgeon  Anesthesia Plan Comments:         Anesthesia Quick Evaluation

## 2013-04-09 NOTE — Interval H&P Note (Signed)
History and Physical Interval Note:  04/09/2013 11:44 AM  Edwin Padilla  has presented today for surgery, with the diagnosis of right inguinal hernia  The various methods of treatment have been discussed with the patient and family. After consideration of risks, benefits and other options for treatment, the patient has consented to  Procedure(s): RIGHT HERNIA REPAIR INGUINAL ADULT (Right) as a surgical intervention .  The patient's history has been reviewed, patient examined, no change in status, stable for surgery.  I have reviewed the patient's chart and labs.  Questions were answered to the patient's satisfaction.     Jerene Yeager J

## 2013-04-09 NOTE — Anesthesia Postprocedure Evaluation (Signed)
  Anesthesia Post-op Note  Patient: Edwin Padilla  Procedure(s) Performed: Procedure(s): RIGHT HERNIA REPAIR INGUINAL ADULT (Right)  Patient Location: PACU  Anesthesia Type:GA combined with regional for post-op pain  Level of Consciousness: awake, alert  and oriented  Airway and Oxygen Therapy: Patient Spontanous Breathing and Patient connected to face mask oxygen  Post-op Pain: none  Post-op Assessment: Post-op Vital signs reviewed  Post-op Vital Signs: Reviewed  Complications: No apparent anesthesia complications

## 2013-04-10 ENCOUNTER — Encounter (HOSPITAL_BASED_OUTPATIENT_CLINIC_OR_DEPARTMENT_OTHER): Payer: Self-pay | Admitting: Surgery

## 2013-04-11 ENCOUNTER — Telehealth (INDEPENDENT_AMBULATORY_CARE_PROVIDER_SITE_OTHER): Payer: Self-pay

## 2013-04-11 NOTE — Telephone Encounter (Signed)
The patient called about his bowel movements.  He hasn't had one since before surgery on Wednesday.  He tried a stool softener and hasn't had results.  I asked him to increase his fluid intake, take a softener daily and take Miralax once or twice a day.  I told him to call if he gets abd pain, bloating and inability to pas gas.  He was also told he can call our office on the weekend if he has problems .  Please call with any further advice.

## 2013-04-15 ENCOUNTER — Telehealth (INDEPENDENT_AMBULATORY_CARE_PROVIDER_SITE_OTHER): Payer: Self-pay | Admitting: General Surgery

## 2013-04-15 NOTE — Telephone Encounter (Signed)
Patient calling status post hernia repair on 04/09/2013. He went back to work yesterday and called today complaining of more pain and swelling at his incision. 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 I advised that is very soon to go back after [facility] repair, it has not even been a week, so it does not surprise me that he is having more swelling and pain. I advised him to take some more time off work. He will take a half day today and see if anything gets better. I advised him he should take another week off work and I offered to write him a note. He declined and will call back with any more problems.

## 2013-04-25 ENCOUNTER — Ambulatory Visit (INDEPENDENT_AMBULATORY_CARE_PROVIDER_SITE_OTHER): Payer: BC Managed Care – PPO | Admitting: Surgery

## 2013-04-25 ENCOUNTER — Encounter (INDEPENDENT_AMBULATORY_CARE_PROVIDER_SITE_OTHER): Payer: Self-pay | Admitting: Surgery

## 2013-04-25 VITALS — BP 100/70 | HR 60 | Temp 98.0°F | Resp 18 | Ht 70.0 in | Wt 161.4 lb

## 2013-04-25 DIAGNOSIS — Z09 Encounter for follow-up examination after completed treatment for conditions other than malignant neoplasm: Secondary | ICD-10-CM

## 2013-04-25 DIAGNOSIS — K4090 Unilateral inguinal hernia, without obstruction or gangrene, not specified as recurrent: Secondary | ICD-10-CM

## 2013-04-25 NOTE — Patient Instructions (Addendum)
See me again in about two weeks 

## 2013-04-25 NOTE — Progress Notes (Signed)
NAME: Edwin Padilla                                            DOB: 1951/01/22 DATE: 04/25/2013                                                  MRN: [id number]  CC:  Chief Complaint  Patient presents with  . Routine Post Op    HPI: This patient comes in for post op follow-up .Heunderwent RIH repair on 04/09/13. He feels that he is doing OK. He had some constipatin on Tuesday, better now with some stool softener. He  Noted a lump medial to the incision, but it has not changed  PE:  VITAL SIGNS: BP 100/70  Pulse 60  Temp(Src) 98 F (36.7 C) (Oral)  Resp 18  Ht 5\' 10"  (1.778 m)  Wt 161 lb 6 oz (73.199 kg)  BMI 23.15 kg/m2  General: The patient appears to be healthy, NAD The incision is clean and healing nicely. There is about a 2.5cm round somewhat fluctuant mass in the medial inguinal canal, does not reduce and does not change with straining   IMPRESSION: There may be a small seroma of the cord. No not think this is an earlyrecurrence, but he had a difficult repair as he had a sliding component    PLAN: He can gradually increas activities and I will see him again in about two weeks to re-assess the area in the cord

## 2013-05-14 ENCOUNTER — Encounter (INDEPENDENT_AMBULATORY_CARE_PROVIDER_SITE_OTHER): Payer: Self-pay | Admitting: Surgery

## 2013-05-14 ENCOUNTER — Ambulatory Visit (INDEPENDENT_AMBULATORY_CARE_PROVIDER_SITE_OTHER): Payer: BC Managed Care – PPO | Admitting: Surgery

## 2013-05-14 VITALS — BP 128/62 | HR 72 | Temp 97.5°F | Resp 16 | Ht 70.0 in | Wt 163.0 lb

## 2013-05-14 DIAGNOSIS — Z09 Encounter for follow-up examination after completed treatment for conditions other than malignant neoplasm: Secondary | ICD-10-CM

## 2013-05-14 NOTE — Patient Instructions (Signed)
YOu may resume all normal activities We will see you again on an as needed basis. Please call the office at [phone] if you have any questions or concerns. Thank you for allowing [place] to take care of you.

## 2013-05-14 NOTE — Progress Notes (Signed)
He comes back for a second postop visit following right inguinal hernia repair. I saw him 2 weeks ago there was what appeared to be a small seroma along the cord. He thinks this is now improved. His walked on the treadmill done well. He is basically having no pain and feels like he is healed fine.  Exam: The incisions soft and healing nicely. Small rounded density that I could feel on his last visit essentially is resolved. There is no evidence of infection. The repair appeared solid.  Impression: Doing well status post right inguinal hernia repair  Plan: We'll see back again on a when necessary basis. He may resume all normal activities.

## 2013-05-19 ENCOUNTER — Other Ambulatory Visit: Payer: Self-pay | Admitting: Licensed Clinical Social Worker

## 2013-05-19 DIAGNOSIS — E785 Hyperlipidemia, unspecified: Secondary | ICD-10-CM

## 2013-05-19 MED ORDER — ATORVASTATIN CALCIUM 40 MG PO TABS: 40.0000 mg | ORAL_TABLET | Freq: Every day | ORAL | Status: DC

## 2013-05-22 ENCOUNTER — Other Ambulatory Visit: Payer: Self-pay | Admitting: Licensed Clinical Social Worker

## 2013-05-22 DIAGNOSIS — B20 Human immunodeficiency virus [HIV] disease: Secondary | ICD-10-CM

## 2013-05-22 DIAGNOSIS — I10 Essential (primary) hypertension: Secondary | ICD-10-CM

## 2013-05-22 MED ORDER — LISINOPRIL 5 MG PO TABS: 5.0000 mg | ORAL_TABLET | Freq: Every day | ORAL | Status: DC

## 2013-05-22 MED ORDER — EFAVIRENZ-EMTRICITAB-TENOFOVIR 600-200-300 MG PO TABS: 1.0000 | ORAL_TABLET | Freq: Every day | ORAL | Status: DC

## 2013-08-06 ENCOUNTER — Other Ambulatory Visit: Payer: BC Managed Care – PPO

## 2013-08-06 DIAGNOSIS — Z79899 Other long term (current) drug therapy: Secondary | ICD-10-CM

## 2013-08-06 DIAGNOSIS — B20 Human immunodeficiency virus [HIV] disease: Secondary | ICD-10-CM

## 2013-08-06 DIAGNOSIS — Z113 Encounter for screening for infections with a predominantly sexual mode of transmission: Secondary | ICD-10-CM

## 2013-08-06 LAB — COMPLETE METABOLIC PANEL WITH GFR
Albumin: 4.3 g/dL (ref 3.5–5.2)
BUN: 20 mg/dL (ref 6–23)
CO2: 28 mEq/L (ref 19–32)
GFR, Est African American: 84 mL/min
GFR, Est Non African American: 72 mL/min
Glucose, Bld: 92 mg/dL (ref 70–99)
Sodium: 139 mEq/L (ref 135–145)
Total Bilirubin: 0.3 mg/dL (ref 0.3–1.2)
Total Protein: 6.6 g/dL (ref 6.0–8.3)

## 2013-08-06 LAB — CBC WITH DIFFERENTIAL/PLATELET
Basophils Absolute: 0 10*3/uL (ref 0.0–0.1)
Eosinophils Absolute: 0.3 10*3/uL (ref 0.0–0.7)
Eosinophils Relative: 5 % (ref 0–5)
HCT: 39.7 % (ref 39.0–52.0)
Lymphocytes Relative: 37 % (ref 12–46)
MCH: 31.9 pg (ref 26.0–34.0)
MCHC: 35.5 g/dL (ref 30.0–36.0)
MCV: 89.8 fL (ref 78.0–100.0)
Monocytes Absolute: 0.7 10*3/uL (ref 0.1–1.0)
RDW: 14.9 % (ref 11.5–15.5)
WBC: 6.4 10*3/uL (ref 4.0–10.5)

## 2013-08-06 LAB — LIPID PANEL: HDL: 50 mg/dL (ref >39)

## 2013-08-06 LAB — RPR

## 2013-08-07 LAB — T-HELPER CELL (CD4) - (RCID CLINIC ONLY): CD4 T Cell Abs: 1010 /uL (ref 400–2700)

## 2013-08-20 ENCOUNTER — Ambulatory Visit (INDEPENDENT_AMBULATORY_CARE_PROVIDER_SITE_OTHER): Payer: BC Managed Care – PPO | Admitting: Infectious Disease

## 2013-08-20 ENCOUNTER — Encounter: Payer: Self-pay | Admitting: Infectious Disease

## 2013-08-20 VITALS — BP 114/76 | HR 55 | Temp 98.6°F | Wt 162.0 lb

## 2013-08-20 DIAGNOSIS — R531 Weakness: Secondary | ICD-10-CM

## 2013-08-20 DIAGNOSIS — R5381 Other malaise: Secondary | ICD-10-CM

## 2013-08-20 DIAGNOSIS — Z23 Encounter for immunization: Secondary | ICD-10-CM

## 2013-08-20 DIAGNOSIS — B20 Human immunodeficiency virus [HIV] disease: Secondary | ICD-10-CM

## 2013-08-20 DIAGNOSIS — IMO0001 Reserved for inherently not codable concepts without codable children: Secondary | ICD-10-CM

## 2013-08-20 DIAGNOSIS — Z21 Asymptomatic human immunodeficiency virus [HIV] infection status: Secondary | ICD-10-CM

## 2013-08-20 DIAGNOSIS — E785 Hyperlipidemia, unspecified: Secondary | ICD-10-CM

## 2013-08-20 DIAGNOSIS — I10 Essential (primary) hypertension: Secondary | ICD-10-CM

## 2013-08-20 DIAGNOSIS — M791 Myalgia, unspecified site: Secondary | ICD-10-CM

## 2013-08-20 DIAGNOSIS — I25810 Atherosclerosis of coronary artery bypass graft(s) without angina pectoris: Secondary | ICD-10-CM

## 2013-08-20 NOTE — Progress Notes (Signed)
Subjective:    Patient ID: Edwin Padilla, male    DOB: 01/19/1951, 62 y.o.   MRN: [id number]  HPI   Edwin Padilla is a 62 y.o. male who is doing superbly well on his  antiviral regimen, on atripla with undetectable viral load and health cd4 count. He was  seeing Edwin Padilla for primary care and was taken off of his pravastatin to see if this may normalize some myalgias when I last saw him but appears to be back on pravachol at present.   Today he also mentioned his symptoms of weakness in his lower tremors in particular his knees. This typically happens after vigorous exertion and tennis playing. I told skeptical of this represented a myositis do to the lipitor (I switched him to this). I told him I was willing check a CK level but that was again skeptical that the Lipitor  is causing any problems. In I reinforced the fact that is very helpful in terms of risk reduction as far as his known coronary disease.  He is otherwise tolerating his Atripla quite well we discussed other possible single tablet regimens including complera, Stribild.     Review of Systems  Constitutional: Negative for fever, chills, diaphoresis, activity change, appetite change, fatigue and unexpected weight change.  HENT: Negative for congestion, sore throat, rhinorrhea, sneezing, trouble swallowing and sinus pressure.   Eyes: Negative for photophobia and visual disturbance.  Respiratory: Negative for cough, chest tightness, shortness of breath, wheezing and stridor.   Cardiovascular: Negative for chest pain, palpitations and leg swelling.  Gastrointestinal: Negative for nausea, vomiting, abdominal pain, diarrhea, constipation, blood in stool, abdominal distention and anal bleeding.  Genitourinary: Negative for dysuria, hematuria, flank pain and difficulty urinating.  Musculoskeletal: Negative for myalgias, back pain, joint swelling, arthralgias and gait problem.  Skin: Negative for color change, pallor, rash and  wound.  Neurological: Positive for weakness. Negative for dizziness, tremors and light-headedness.  Hematological: Negative for adenopathy. Does not bruise/bleed easily.  Psychiatric/Behavioral: Negative for behavioral problems, confusion, sleep disturbance, dysphoric mood, decreased concentration and agitation.       Objective:   Physical Exam  Constitutional: He is oriented to person, place, and time. He appears well-developed and well-nourished. No distress.  HENT:  Head: Normocephalic and atraumatic.  Mouth/Throat: Oropharynx is clear and moist. No oropharyngeal exudate.  Eyes: Conjunctivae and EOM are normal. Pupils are equal, round, and reactive to light. No scleral icterus.  Neck: Normal range of motion. Neck supple. No JVD present.  Cardiovascular: Normal rate, regular rhythm and normal heart sounds.  Exam reveals no gallop and no friction rub.   No murmur heard. Pulmonary/Chest: Effort normal and breath sounds normal. No respiratory distress. He has no wheezes. He has no rales. He exhibits no tenderness.  Abdominal: He exhibits no distension and no mass. There is no tenderness. There is no rebound and no guarding.  Musculoskeletal: He exhibits no edema and no tenderness.  Lymphadenopathy:    He has no cervical adenopathy.  Neurological: He is alert and oriented to person, place, and time. He has normal reflexes. He exhibits normal muscle tone. Coordination normal.  Skin: Skin is warm and dry. He is not diaphoretic. No erythema. No pallor.  Psychiatric: He has a normal mood and affect. His behavior is normal. Judgment and thought content normal.          Assessment & Plan:   HIV: continue atripla, I spent greater than 45 minutes with the patient including greater than  50% of time in face to face counsel of the patient and in coordination of their care.   ZOX:WRUEAVW 40mg , he is on asa and ACEI  HTN : well controlled  Hx of myalgias:  Don't think is related to his  statin therapy but more likely is physical exertion.

## 2013-09-19 ENCOUNTER — Ambulatory Visit (INDEPENDENT_AMBULATORY_CARE_PROVIDER_SITE_OTHER): Payer: BC Managed Care – PPO

## 2013-09-19 DIAGNOSIS — Z23 Encounter for immunization: Secondary | ICD-10-CM

## 2013-09-21 ENCOUNTER — Encounter: Payer: Self-pay | Admitting: Infectious Disease

## 2014-01-09 ENCOUNTER — Ambulatory Visit (INDEPENDENT_AMBULATORY_CARE_PROVIDER_SITE_OTHER): Payer: BC Managed Care – PPO | Admitting: Internal Medicine

## 2014-01-09 ENCOUNTER — Encounter: Payer: Self-pay | Admitting: Internal Medicine

## 2014-01-09 VITALS — BP 120/78 | HR 57 | Temp 98.6°F | Ht 70.0 in | Wt 157.1 lb

## 2014-01-09 DIAGNOSIS — Z21 Asymptomatic human immunodeficiency virus [HIV] infection status: Secondary | ICD-10-CM

## 2014-01-09 DIAGNOSIS — E785 Hyperlipidemia, unspecified: Secondary | ICD-10-CM

## 2014-01-09 DIAGNOSIS — I25810 Atherosclerosis of coronary artery bypass graft(s) without angina pectoris: Secondary | ICD-10-CM

## 2014-01-09 DIAGNOSIS — Z0000 Encounter for general adult medical examination without abnormal findings: Secondary | ICD-10-CM

## 2014-01-09 DIAGNOSIS — B20 Human immunodeficiency virus [HIV] disease: Secondary | ICD-10-CM

## 2014-01-09 NOTE — Assessment & Plan Note (Signed)
On various statins in past, currently atorvastatin well tolerated Check lipid annually, last LDL at goal The current medical regimen is effective;  continue present plan and medications.

## 2014-01-09 NOTE — Progress Notes (Signed)
Subjective:    Patient ID: Edwin Padilla, male    DOB: 08/11/1951, 63 y.o.   MRN: [id number]  HPI  New patient to me, here to establish with PCP patient is here today for annual physical. Patient feels well and has no complaints.   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 Follows annually with cards for same, but ?need to continue same. On statin, ACEI and aspirin 81 mg daily - denies angina, chest pain or dyspnea on exertion.  HIV - dx 2003 with acute illness -follows with ID for same. counts undetectable for years, no secondary related disease from same  Past Medical History  Diagnosis Date  . Allergy     Cat allergy  . Coronary atherosclerosis of unspecified type of vessel, native or graft     CABG 07/2003  . Myocardial infarction 2004  . HIV infection   . Hyperlipidemia   . Substance abuse   . Right inguinal hernia 07/09/2012  . CAD (coronary artery disease) of artery bypass graft 08/15/2012  . HIV (human immunodeficiency virus infection) 08/28/2011  . Incarceration 08/28/2011  . 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 Asthma    Family History  Problem Relation Age of Onset  . Stroke Mother 89  . Heart failure Father   . Heart disease Father   . Cancer Father     colon  . Colon cancer Father   . Cancer Brother 37    lymphoma   History  Substance Use Topics  . Smoking status: Former Smoker -- 35 years    Types: Cigarettes    Quit date: 12/12/2003  . Smokeless tobacco: Never Used  . Alcohol Use: No     Comment: 2005    Review of Systems  Constitutional: Negative for fever, activity change, appetite change, fatigue and unexpected weight change.  Respiratory: Negative for cough, chest tightness, shortness of breath and wheezing.   Cardiovascular: Negative for chest pain, palpitations and leg swelling.  Neurological: Negative for dizziness, weakness and headaches.  Psychiatric/Behavioral: Negative for dysphoric mood. The patient is not  nervous/anxious.   All other systems reviewed and are negative.       Objective:   Physical Exam BP 120/78  Pulse 57  Temp(Src) 98.6 F (37 C) (Oral)  Ht 5\' 10"  (1.778 m)  Wt 157 lb 1.9 oz (71.269 kg)  BMI 22.54 kg/m2  SpO2 97% Wt Readings from Last 3 Encounters:  01/09/14 157 lb 1.9 oz (71.269 kg)  08/20/13 162 lb (73.483 kg)  05/14/13 163 lb (73.936 kg)   Constitutional: he appears well-developed and well-nourished. No distress.  HENT: Head: Normocephalic and atraumatic. Ears: B soft cerumen impaction (declines irrigation), no erythema; Nose: Nose normal. Mouth/Throat: Oropharynx is clear and moist. No oropharyngeal exudate.  Eyes: Conjunctivae and EOM are normal. Pupils are equal, round, and reactive to light. No scleral icterus.  Neck: Normal range of motion. Neck supple. No JVD present. No thyromegaly present.  Cardiovascular: Normal rate, regular rhythm and normal heart sounds.  No murmur heard. No BLE edema. Pulmonary/Chest: Effort normal and breath sounds normal. No respiratory distress. he has no wheezes.  Abdominal: Soft. Bowel sounds are normal. he exhibits no distension. There is no tenderness. no masses Musculoskeletal: Normal range of motion, no joint effusions. No gross deformities Neurological: he is alert and oriented to person, place, and time. No cranial nerve deficit. Coordination, balance, strength, speech and gait are normal.  Skin: L tigh with SK and solar  changes. Skin is warm and dry. No rash noted. No erythema.  Psychiatric: he has a normal mood and affect. behavior is normal. Judgment and thought content normal.  Lab Results  Component Value Date   WBC 6.4 08/06/2013   HGB 14.1 08/06/2013   HCT 39.7 08/06/2013   PLT 328 08/06/2013   GLUCOSE 92 08/06/2013   CHOL 178 08/06/2013   TRIG 209* 08/06/2013   HDL 50 08/06/2013   LDLCALC 86 08/06/2013   ALT 40 08/06/2013   AST 27 08/06/2013   NA 139 08/06/2013   K 4.6 08/06/2013   CL 103 08/06/2013   CREATININE  1.09 08/06/2013   BUN 20 08/06/2013   CO2 28 08/06/2013       Assessment & Plan:   CPX/v70.0 - Patient has been counseled on age-appropriate routine health concerns for screening and prevention. These are reviewed and up-to-date. Immunizations are up-to-date or declined. Labs and reviewed.  Also see problem list. Medications and labs reviewed today.

## 2014-01-09 NOTE — Assessment & Plan Note (Signed)
2 vessel bypass surgery 2014 following MI  Medicaltreatment ongoing for same: compliant with ACE, statin and aspirin No cardiac symptoms, cholesterol at goal Advised it is reasonable for patient to follow here annually and will refer to cardiology as needed for recurrent symptoms or problems

## 2014-01-09 NOTE — Assessment & Plan Note (Signed)
Diagnosis 2003 with acute viral illness Viral load undetectable with well-controlled disease on antiviral therapy Follows annually with ID for same Interval history reviewed, no changes recommended

## 2014-01-09 NOTE — Patient Instructions (Addendum)
It was good to see you today.  We have reviewed your prior records including labs and tests today  Health Maintenance reviewed - consider shingles vaccination as discussed -all other recommended immunizations and age-appropriate screenings are up-to-date.  Medications reviewed and updated, no changes recommended at this time.  Please schedule followup in 8 months (annually thereafter);, call sooner if problems.   Health Maintenance, Males A healthy lifestyle and preventative care can promote health and wellness.  Maintain regular health, dental, and eye exams.  Eat a healthy diet. Foods like vegetables, fruits, whole grains, low-fat dairy products, and lean protein foods contain the nutrients you need and are low in calories. Decrease your intake of foods high in solid fats, added sugars, and salt. Get information about a proper diet from your health care provider, if necessary.  Regular physical exercise is one of the most important things you can do for your health. Most adults should get at least 150 minutes of moderate-intensity exercise (any activity that increases your heart rate and causes you to sweat) each week. In addition, most adults need muscle-strengthening exercises on 2 or more days a week.   Maintain a healthy weight. The body mass index (BMI) is a screening tool to identify possible weight problems. It provides an estimate of body fat based on height and weight. Your health care provider can find your BMI and can help you achieve or maintain a healthy weight. For males 20 years and older:  A BMI below 18.5 is considered underweight.  A BMI of 18.5 to 24.9 is normal.  A BMI of 25 to 29.9 is considered overweight.  A BMI of 30 and above is considered obese.  Maintain normal blood lipids and cholesterol by exercising and minimizing your intake of saturated fat. Eat a balanced diet with plenty of fruits and vegetables. Blood tests for lipids and cholesterol should begin  at age 56 and be repeated every 5 years. If your lipid or cholesterol levels are high, you are over 50, or you are at high risk for heart disease, you may need your cholesterol levels checked more frequently.Ongoing high lipid and cholesterol levels should be treated with medicines, if diet and exercise are not working.  If you smoke, find out from your health care provider how to quit. If you do not use tobacco, do not start.  Lung cancer screening is recommended for adults aged 49 80 years who are at high risk for developing lung cancer because of a history of smoking. A yearly low-dose CT scan of the lungs is recommended for people who have at least a 30-pack-year history of smoking and are a current smoker or have quit within the past 15 years. A pack year of smoking is smoking an average of 1 pack of cigarettes a day for 1 year (for example, a 30-pack-year history of smoking could mean smoking 1 pack a day for 30 years or 2 packs a day for 15 years). Yearly screening should continue until the smoker has stopped smoking for at least 15 years. Yearly screening should be stopped for people who develop a health problem that would prevent them from having lung cancer treatment.  If you choose to drink alcohol, do not have more than 2 drinks per day. One drink is considered to be 12 oz (360 mL) of beer, 5 oz (150 mL) of wine, or 1.5 oz (45 mL) of liquor.  Avoid use of street drugs. Do not share needles with anyone. Ask for help  if you need support or instructions about stopping the use of drugs.  High blood pressure causes heart disease and increases the risk of stroke. Blood pressure should be checked at least every 1 2 years. Ongoing high blood pressure should be treated with medicines if weight loss and exercise are not effective.  If you are 43 63 years old, ask your health care provider if you should take aspirin to prevent heart disease.  Diabetes screening involves taking a blood sample to check  your fasting blood sugar level. This should be done once every 3 years after age 53, if you are at a normal weight and without risk factors for diabetes. Testing should be considered at a younger age or be carried out more frequently if you are overweight and have at least 1 risk factor for diabetes.  Colorectal cancer can be detected and often prevented. Most routine colorectal cancer screening begins at the age of 69 and continues through age 39. However, your health care provider may recommend screening at an earlier age if you have risk factors for colon cancer. On a yearly basis, your health care provider may provide home test kits to check for hidden blood in the stool. A small camera at the end of a tube may be used to directly examine the colon (sigmoidoscopy or colonoscopy) to detect the earliest forms of colorectal cancer. Talk to your health care provider about this at age 32, when routine screening begins. A direct exam of the colon should be repeated every 5 10 years through age 42, unless early forms of pre-cancerous polyps or small growths are found.  People who are at an increased risk for hepatitis B should be screened for this virus. You are considered at high risk for hepatitis B if:  You were born in a country where hepatitis B occurs often. Talk with your health care provider about which countries are considered high-risk.  Your parents were born in a high-risk country and you have not received a shot to protect against hepatitis B (hepatitis B vaccine).  You have HIV or AIDS.  You use needles to inject street drugs.  You live with, or have sex with, someone who has hepatitis B.  You are a man who has sex with other men (MSM).  You get hemodialysis treatment.  You take certain medicines for conditions like cancer, organ transplantation, and autoimmune conditions.  Hepatitis C blood testing is recommended for all people born from [birth year] through 1965 and any individual with  known risk factors for hepatitis C.  Healthy men should no longer receive prostate-specific antigen (PSA) blood tests as part of routine cancer screening. Talk to your health care provider about prostate cancer screening.  Testicular cancer screening is not recommended for adolescents or adult males who have no symptoms. Screening includes self-exam, a health care provider exam, and other screening tests. Consult with your health care provider about any symptoms you have or any concerns you have about testicular cancer.  Practice safe sex. Use condoms and avoid high-risk sexual practices to reduce the spread of sexually transmitted infections (STIs).  Use sunscreen. Apply sunscreen liberally and repeatedly throughout the day. You should seek shade when your shadow is shorter than you. Protect yourself by wearing long sleeves, pants, a wide-brimmed hat, and sunglasses year round, whenever you are outdoors.  Tell your health care provider of new moles or changes in moles, especially if there is a change in shape or color. Also tell your provider if  a mole is larger than the size of a pencil eraser.  A one-time screening for abdominal aortic aneurysm (AAA) and surgical repair of large AAAs by ultrasound is recommended for men aged 38 75 years who are current or former smokers.  Stay current with your vaccines (immunizations). Document Released: 05/25/2008 Document Revised: 09/17/2013 Document Reviewed: 04/24/2011 [facility] Patient Information 2014 [place], [place].

## 2014-02-16 ENCOUNTER — Other Ambulatory Visit: Payer: BC Managed Care – PPO

## 2014-02-16 DIAGNOSIS — B20 Human immunodeficiency virus [HIV] disease: Secondary | ICD-10-CM

## 2014-02-16 LAB — CBC WITH DIFFERENTIAL/PLATELET
BASOS ABS: 0 10*3/uL (ref 0.0–0.1)
Basophils Relative: 0 % (ref 0–1)
EOS PCT: 8 % — AB (ref 0–5)
Eosinophils Absolute: 0.6 10*3/uL (ref 0.0–0.7)
HCT: 40.8 % (ref 39.0–52.0)
Hemoglobin: 14.1 g/dL (ref 13.0–17.0)
Lymphocytes Relative: 36 % (ref 12–46)
Lymphs Abs: 2.6 10*3/uL (ref 0.7–4.0)
MCH: 30.7 pg (ref 26.0–34.0)
MCHC: 34.6 g/dL (ref 30.0–36.0)
MCV: 88.7 fL (ref 78.0–100.0)
MONO ABS: 0.5 10*3/uL (ref 0.1–1.0)
Monocytes Relative: 7 % (ref 3–12)
NEUTROS ABS: 3.5 10*3/uL (ref 1.7–7.7)
Neutrophils Relative %: 49 % (ref 43–77)
Platelets: 359 10*3/uL (ref 150–400)
RBC: 4.6 MIL/uL (ref 4.22–5.81)
RDW: 14.7 % (ref 11.5–15.5)
WBC: 7.1 10*3/uL (ref 4.0–10.5)

## 2014-02-16 LAB — COMPLETE METABOLIC PANEL WITH GFR
ALK PHOS: 84 U/L (ref 39–117)
ALT: 34 U/L (ref 0–53)
AST: 22 U/L (ref 0–37)
Albumin: 4.1 g/dL (ref 3.5–5.2)
BUN: 25 mg/dL — AB (ref 6–23)
CALCIUM: 9.4 mg/dL (ref 8.4–10.5)
CO2: 27 mEq/L (ref 19–32)
CREATININE: 0.94 mg/dL (ref 0.50–1.35)
Chloride: 104 mEq/L (ref 96–112)
GFR, Est African American: >89 mL/min
GFR, Est Non African American: 87 mL/min
Glucose, Bld: 91 mg/dL (ref 70–99)
Potassium: 4.4 mEq/L (ref 3.5–5.3)
Sodium: 139 mEq/L (ref 135–145)
Total Bilirubin: 0.3 mg/dL (ref 0.2–1.2)
Total Protein: 6.5 g/dL (ref 6.0–8.3)

## 2014-02-16 LAB — LIPID PANEL
CHOL/HDL RATIO: 3 ratio
CHOLESTEROL: 154 mg/dL (ref 0–200)
HDL: 52 mg/dL (ref >39)
LDL CALC: 83 mg/dL (ref 0–99)
TRIGLYCERIDES: 95 mg/dL (ref <150)
VLDL: 19 mg/dL (ref 0–40)

## 2014-02-17 LAB — HIV-1 RNA QUANT-NO REFLEX-BLD: HIV 1 RNA Quant: <20 copies/mL (ref <20)

## 2014-02-17 LAB — T-HELPER CELL (CD4) - (RCID CLINIC ONLY)
CD4 % Helper T Cell: 37 % (ref 33–55)
CD4 T Cell Abs: 880 /uL (ref 400–2700)

## 2014-02-17 LAB — RPR

## 2014-02-17 LAB — HEPATITIS C ANTIBODY: HCV Ab: NEGATIVE

## 2014-03-02 ENCOUNTER — Ambulatory Visit: Payer: BC Managed Care – PPO | Admitting: Infectious Disease

## 2014-03-12 ENCOUNTER — Encounter: Payer: Self-pay | Admitting: Infectious Disease

## 2014-03-12 ENCOUNTER — Ambulatory Visit (INDEPENDENT_AMBULATORY_CARE_PROVIDER_SITE_OTHER): Payer: BC Managed Care – PPO | Admitting: Infectious Disease

## 2014-03-12 VITALS — BP 111/67 | HR 66 | Temp 98.5°F | Wt 158.0 lb

## 2014-03-12 DIAGNOSIS — I10 Essential (primary) hypertension: Secondary | ICD-10-CM

## 2014-03-12 DIAGNOSIS — Z21 Asymptomatic human immunodeficiency virus [HIV] infection status: Secondary | ICD-10-CM

## 2014-03-12 DIAGNOSIS — B20 Human immunodeficiency virus [HIV] disease: Secondary | ICD-10-CM

## 2014-03-12 DIAGNOSIS — I2510 Atherosclerotic heart disease of native coronary artery without angina pectoris: Secondary | ICD-10-CM

## 2014-03-12 DIAGNOSIS — E785 Hyperlipidemia, unspecified: Secondary | ICD-10-CM

## 2014-03-12 NOTE — Progress Notes (Signed)
  Subjective:    Patient ID: Edwin Padilla, male    DOB: 1951/06/09, 63 y.o.   MRN: [id number]  HPI  Edwin Padilla is a 63 y.o. male who is doing superbly well on his  antiviral regimen, on atripla with undetectable viral load and health cd4 count. He has now established primary care with [name].    He is otherwise tolerating his Atripla quite well we discussed other possible single tablet regimens including complera, Stribild and complera.  He states he is taking his atorvastatin and I reinforced how important this was for anti-inflammatory properties and for morbidity and mortality benefit.      Review of Systems  Constitutional: Negative for fever, chills, diaphoresis, activity change, appetite change, fatigue and unexpected weight change.  HENT: Negative for sinus pressure and trouble swallowing.   Eyes: Negative for photophobia and visual disturbance.  Respiratory: Negative for chest tightness, shortness of breath and stridor.   Cardiovascular: Negative for palpitations and leg swelling.  Gastrointestinal: Negative for constipation, blood in stool, abdominal distention and anal bleeding.  Genitourinary: Negative for hematuria, flank pain and difficulty urinating.  Musculoskeletal: Negative for arthralgias, back pain, gait problem, joint swelling and myalgias.  Skin: Negative for color change, pallor and wound.  Neurological: Positive for weakness. Negative for dizziness, tremors and light-headedness.  Hematological: Negative for adenopathy. Does not bruise/bleed easily.  Psychiatric/Behavioral: Negative for behavioral problems, confusion, sleep disturbance, dysphoric mood, decreased concentration and agitation.       Objective:   Physical Exam  Constitutional: He is oriented to person, place, and time. He appears well-developed and well-nourished. No distress.  HENT:  Head: Normocephalic and atraumatic.  Mouth/Throat: Oropharynx is clear and moist. No oropharyngeal  exudate.  Eyes: Conjunctivae and EOM are normal. Pupils are equal, round, and reactive to light. No scleral icterus.  Neck: Normal range of motion. Neck supple. No JVD present.  Cardiovascular: Normal rate, regular rhythm and normal heart sounds.  Exam reveals no gallop and no friction rub.   No murmur heard. Pulmonary/Chest: Effort normal and breath sounds normal. No respiratory distress. He has no wheezes. He has no rales. He exhibits no tenderness.  Abdominal: He exhibits no distension and no mass. There is no tenderness. There is no rebound and no guarding.  Musculoskeletal: He exhibits no edema and no tenderness.  Lymphadenopathy:    He has no cervical adenopathy.  Neurological: He is alert and oriented to person, place, and time. He has normal reflexes. He exhibits normal muscle tone. Coordination normal.  Skin: Skin is warm and dry. He is not diaphoretic. No erythema. No pallor.  Psychiatric: He has a normal mood and affect. His behavior is normal. Judgment and thought content normal.          Assessment & Plan:   HIV: continue atripla, I spent greater than 25 minutes with the patient including greater than 50% of time in face to face counsel of the patient and in coordination of their care.   VOZ:DGUYQIH 40mg , he is on asa and ACEI  HTN : well controlled

## 2014-03-12 NOTE — Patient Instructions (Signed)
The two other options for once a day would be:  complera  And  Stribild

## 2014-03-19 ENCOUNTER — Ambulatory Visit: Payer: BC Managed Care – PPO | Admitting: Family Medicine

## 2014-03-20 ENCOUNTER — Encounter: Payer: Self-pay | Admitting: Family Medicine

## 2014-03-20 ENCOUNTER — Ambulatory Visit (INDEPENDENT_AMBULATORY_CARE_PROVIDER_SITE_OTHER): Payer: BC Managed Care – PPO | Admitting: Family Medicine

## 2014-03-20 VITALS — BP 112/75 | HR 60 | Temp 99.2°F | Resp 18 | Ht 70.0 in | Wt 155.0 lb

## 2014-03-20 DIAGNOSIS — B20 Human immunodeficiency virus [HIV] disease: Secondary | ICD-10-CM

## 2014-03-20 DIAGNOSIS — J069 Acute upper respiratory infection, unspecified: Secondary | ICD-10-CM

## 2014-03-20 DIAGNOSIS — J209 Acute bronchitis, unspecified: Secondary | ICD-10-CM

## 2014-03-20 MED ORDER — DOXYCYCLINE HYCLATE 100 MG PO CAPS: 100.0000 mg | ORAL_CAPSULE | Freq: Two times a day (BID) | ORAL | Status: DC

## 2014-03-20 MED ORDER — BENZONATATE 200 MG PO CAPS: 200.0000 mg | ORAL_CAPSULE | Freq: Three times a day (TID) | ORAL | Status: DC | PRN

## 2014-03-20 NOTE — Progress Notes (Signed)
OFFICE NOTE  03/20/2014  CC:  Chief Complaint  Patient presents with  . URI    since last Monday  . Cough     HPI: Patient is a 63 y.o. Caucasian male who is here for URI sx's/cough. Onset 12 days ago nasal congestion/runny nose, coughing.  After about 1 wk the nasal sx's got better and the cough persisted--no improvement in this lately. Delsym helps cough but makes him feel weird. Some subjective fever.  No chest tightness, wheezing, or SOB.  ROS: no n/v/d or abd pains.  No CP or palpitations  Pertinent PMH:  Past medical, surgical, social, and family history reviewed and no changes are noted since last office visit.  MEDS:  Outpatient Prescriptions Prior to Visit  Medication Sig Dispense Refill  . aspirin 81 MG chewable tablet Chew 81 mg by mouth daily.        Marland Kitchen atorvastatin (LIPITOR) 40 MG tablet Take 1 tablet (40 mg total) by mouth daily.  30 tablet  11  . efavirenz-emtricitabine-tenofovir (ATRIPLA) 600-200-300 MG per tablet Take 1 tablet by mouth at bedtime.  90 tablet  3  . lisinopril (PRINIVIL,ZESTRIL) 5 MG tablet Take 1 tablet (5 mg total) by mouth daily.  90 tablet  3   No facility-administered medications prior to visit.    PE: Blood pressure 112/75, pulse 60, temperature 99.2 F (37.3 C), temperature source Temporal, resp. rate 18, height 5\' 10"  (1.778 m), weight 155 lb (70.308 kg), SpO2 97.00%. VS: noted--normal. Gen: alert, NAD, NONTOXIC APPEARING. HEENT: eyes without injection, drainage, or swelling.  Ears: EACs clear, TMs with normal light reflex and landmarks.  Nose: Clear rhinorrhea, with some dried, crusty exudate adherent to mildly injected mucosa.  No purulent d/c.  No paranasal sinus TTP.  No facial swelling.  Throat and mouth without focal lesion.  No pharyngial swelling, erythema, or exudate.   Neck: supple, no LAD.   LUNGS: CTA bilat, nonlabored resps.   CV: RRR, no m/r/g. EXT: no c/c/e SKIN: no rash    IMPRESSION AND PLAN:  Prolonged URI with  bronchitis. No sign of RAD. Doxycycline 100mg  bid x 10d. Tessalon perles rx: 1 tid prn, 30, no RF. Recommended mucinex plain or robitussin plain--OTC. Signs/symptoms to call or return for were reviewed and pt expressed understanding.Marland Kitchen  An After Visit Summary was printed and given to the patient.  FOLLOW UP: prn

## 2014-03-20 NOTE — Progress Notes (Signed)
Pre visit review using our clinic review tool, if applicable. No additional management support is needed unless otherwise documented below in the visit note. 

## 2014-03-20 NOTE — Patient Instructions (Signed)
Stop delsym (dextromethorphan). Buy OTC mucinex "plain" or generic robitussin "plain" and take as directed on packaging.

## 2014-03-23 ENCOUNTER — Ambulatory Visit: Payer: BC Managed Care – PPO | Admitting: Internal Medicine

## 2014-03-27 ENCOUNTER — Ambulatory Visit: Payer: BC Managed Care – PPO | Admitting: Internal Medicine

## 2014-04-10 ENCOUNTER — Encounter: Payer: Self-pay | Admitting: Internal Medicine

## 2014-04-10 ENCOUNTER — Ambulatory Visit (INDEPENDENT_AMBULATORY_CARE_PROVIDER_SITE_OTHER): Payer: BC Managed Care – PPO | Admitting: Internal Medicine

## 2014-04-10 VITALS — BP 100/66 | HR 67 | Temp 98.4°F | Resp 13 | Wt 157.0 lb

## 2014-04-10 DIAGNOSIS — R059 Cough, unspecified: Secondary | ICD-10-CM

## 2014-04-10 DIAGNOSIS — R05 Cough: Secondary | ICD-10-CM

## 2014-04-10 MED ORDER — OMEPRAZOLE 20 MG PO CPDR: 20.0000 mg | DELAYED_RELEASE_CAPSULE | Freq: Every day | ORAL | Status: DC

## 2014-04-10 MED ORDER — BENZONATATE 200 MG PO CAPS: 200.0000 mg | ORAL_CAPSULE | Freq: Three times a day (TID) | ORAL | Status: DC | PRN

## 2014-04-10 NOTE — Progress Notes (Signed)
Pre visit review using our clinic review tool, if applicable. No additional management support is needed unless otherwise documented below in the visit note. 

## 2014-04-10 NOTE — Progress Notes (Signed)
   Subjective:    Patient ID: Edwin Padilla, male    DOB: 1951-03-08, 63 y.o.   MRN: [id number]  HPI He was seen on 4/10 and was treated for URI/bronchitis with doxycycline and tessalon perrles.  Since 4/24, he has been waking up at night with a dry cough. The first two nights he woke up sweating.  Tessalon is giving him relief with cough, although he is concerned that it is lingering.  He is experiencing mild cervical lymph tenderness, which is a primary concern.  He has taken lisinopril for 10 years with no associated cough.   Review of Systems Denies fever, chills, frontal headache, maxillary pain, dental pain, sore throat, chest sputum, discolored nasal secretions, ear pain or ear discharge.     Objective:   Physical Exam  General appearance:thin but  well nourished; no acute distress or increased work of breathing is present. No lymphadenopathy about the head, neck, or axilla noted.  Eyes: No conjunctival inflammation or lid edema is present. There is no scleral icterus.  Ears: External ear exam shows no significant lesions or deformities. Otoscopic examination reveals clear canals, tympanic membranes are intact bilaterally without bulging, retraction, inflammation or discharge.  Nose: External nasal examination shows no deformity or inflammation. Nasal mucosa are pink and moist without lesions or exudates. No septal dislocation or deviation.No obstruction to airflow.  Oral exam: Fair dentition; lips and gums are healthy appearing.There is no oropharyngeal erythema or exudate noted.  Neck: No deformities, masses, or tenderness noted. Supple with full range of motion without pain.  Heart: Normal rate and regular rhythm. S1 and S2 normal without gallop, murmur, click, rub or other extra sounds.  Lungs:Chest clear to auscultation; no wheezes, rhonchi,rales ,or rubs present.No increased work of breathing.  Extremities: No cyanosis, edema, or clubbing noted  Skin: Warm & dry w/o jaundice or  tenting.     Assessment & Plan:  #1 nocturnal cough ; most probably from GERD, possibly aggravated by recent Doxycycline Rx. Doubt infectious etiology or relationship to ACE-I See orders / AVS. CXray , CBC & dif & change to ARB if cough persists post PPI trial

## 2014-04-10 NOTE — Patient Instructions (Signed)
Reflux of gastric acid may be asymptomatic as this may occur mainly during sleep.The triggers for reflux  include stress; the "aspirin family" ; alcohol; peppermint; and caffeine (coffee, tea, cola, and chocolate). The aspirin family would include aspirin and the nonsteroidal agents such as ibuprofen &  Naproxen. Tylenol would not cause reflux. If having symptoms ; food & drink should be avoided for @ least 2 hours before going to bed. Take the protein pump inhibitor 30 minutes before breakfast and 30 minutes before the evening meal for 8 weeks then go back to once a day  30 minutes before breakfast.  If the cough persists; the ACE inhibitor should be changed to an angiotensin receptor blocker which is not associated with cough.

## 2014-04-10 NOTE — Progress Notes (Signed)
   Subjective:    Patient ID: Edwin Padilla, male    DOB: [birth year], 63 y.o.   MRN: [id number]  HPI  He was seen on 4/10 and was treated for URI/bronchitis with doxycycline and tessalon perles.  Since 4/24, he has been waking up at night with a dry cough. The first two nights he woke up sweating.   Tessalon is giving him relief with cough, although he is concerned that it is lingering.  He is experiencing mild cervical lymph tenderness, which is a primary concern.   He has taken lisinopril for 10 years with no associated cough.   Review of Systems Denies fever, chills, frontal headache, maxillary pain, dental pain, sore throat, chest sputum, discolored nasal secretions, ear pain or ear discharge.     Objective:   Physical Exam  General appearance:good health; well nourished; no acute distress or increased work of breathing is present.  No lymphadenopathy about the head, neck, or axilla noted.   Eyes: No conjunctival inflammation or lid edema is present. There is no scleral icterus.  Ears:  External ear exam shows no significant lesions or deformities.  Otoscopic examination reveals clear canals, tympanic membranes are intact bilaterally without bulging, retraction, inflammation or discharge.  Nose:  External nasal examination shows no deformity or inflammation. Nasal mucosa are pink and moist without lesions or exudates. No septal dislocation or deviation.No obstruction to airflow.   Oral exam: Poor dentition; lips and gums are healthy appearing.There is no oropharyngeal erythema or exudate noted.   Neck:  No deformities, masses, or tenderness noted.  Supple with full range of motion without pain.   Heart:  Normal rate and regular rhythm. S1 and S2 normal without gallop, murmur, click, rub or other extra sounds.   Lungs:Chest clear to auscultation; no wheezes, rhonchi,rales ,or rubs present.No increased work of breathing.    Extremities:  No cyanosis, edema, or clubbing  noted    Skin: Warm & dry w/o jaundice or tenting.     Assessment & Plan:  #1

## 2014-05-06 ENCOUNTER — Other Ambulatory Visit: Payer: Self-pay | Admitting: Infectious Disease

## 2014-05-11 ENCOUNTER — Other Ambulatory Visit: Payer: Self-pay | Admitting: *Deleted

## 2014-05-11 DIAGNOSIS — R05 Cough: Secondary | ICD-10-CM

## 2014-05-11 DIAGNOSIS — E785 Hyperlipidemia, unspecified: Secondary | ICD-10-CM

## 2014-05-11 DIAGNOSIS — R059 Cough, unspecified: Secondary | ICD-10-CM

## 2014-05-11 DIAGNOSIS — B20 Human immunodeficiency virus [HIV] disease: Secondary | ICD-10-CM

## 2014-05-11 MED ORDER — OMEPRAZOLE 20 MG PO CPDR: 20.0000 mg | DELAYED_RELEASE_CAPSULE | Freq: Every day | ORAL | Status: DC

## 2014-05-11 MED ORDER — EFAVIRENZ-EMTRICITAB-TENOFOVIR 600-200-300 MG PO TABS: ORAL_TABLET | ORAL | Status: DC

## 2014-05-11 MED ORDER — ATORVASTATIN CALCIUM 40 MG PO TABS: 40.0000 mg | ORAL_TABLET | Freq: Every day | ORAL | Status: DC

## 2014-06-05 ENCOUNTER — Other Ambulatory Visit: Payer: Self-pay | Admitting: Infectious Diseases

## 2014-06-18 ENCOUNTER — Ambulatory Visit (INDEPENDENT_AMBULATORY_CARE_PROVIDER_SITE_OTHER): Payer: BC Managed Care – PPO | Admitting: Family Medicine

## 2014-06-18 ENCOUNTER — Ambulatory Visit: Payer: BC Managed Care – PPO | Admitting: Family Medicine

## 2014-06-18 ENCOUNTER — Encounter: Payer: Self-pay | Admitting: Family Medicine

## 2014-06-18 VITALS — BP 115/75 | HR 60 | Temp 99.2°F | Resp 18 | Ht 70.0 in | Wt 158.0 lb

## 2014-06-18 DIAGNOSIS — R42 Dizziness and giddiness: Secondary | ICD-10-CM | POA: Insufficient documentation

## 2014-06-18 NOTE — Progress Notes (Signed)
OFFICE VISIT  06/18/2014   CC:  Chief Complaint  Patient presents with  . Dizziness    this am    HPI:    Patient is a 63 y.o. Caucasian male who presents for dizziness. Upon awakening this morning he noted feeling disequilibrium lying in bed and a bit worse when upright and walking. No vertigo or nausea.  No appetite.  No recent new meds.  Hydration status ok per pt.  NO ringing in ears, no hearing deficit, no vision abnormality.  No difficulty swallowing or speaking.  No focal weakness, tingling, or numbness. The dizziness has improved since this morning.  He has never had this before. Denies: feeling malaise.  No URI sx's, no cough, no urinary or bowel complaints.    Past Medical History  Diagnosis Date  . Allergy     Cat allergy  . Coronary atherosclerosis of unspecified type of vessel, native or graft     CABG 07/2003  . Myocardial infarction 2004  . HIV infection   . Hyperlipidemia   . Substance abuse     hx of  . Right inguinal hernia     s/p repair 03/2013  . 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 Asthma     Past Surgical History  Procedure Laterality Date  . Coronary artery bypass graft  2004  . Hernia repair      as infant  . Colonoscopy    . Tonsillectomy    . Inguinal hernia repair Right 04/09/2013    Procedure: RIGHT HERNIA REPAIR INGUINAL ADULT;  Surgeon: Haywood Lasso, MD;  Location: [facility];  Service: General;  Laterality: Right;    Outpatient Prescriptions Prior to Visit  Medication Sig Dispense Refill  . aspirin 81 MG chewable tablet Chew 81 mg by mouth daily.        Marland Kitchen atorvastatin (LIPITOR) 40 MG tablet Take 1 tablet (40 mg total) by mouth daily.  90 tablet  3  . efavirenz-emtricitabine-tenofovir (ATRIPLA) 600-200-300 MG per tablet TAKE 1 TABLET BY MOUTH EVERY NIGHT AT BEDTIME  90 tablet  2  . lisinopril (PRINIVIL,ZESTRIL) 5 MG tablet TAKE 1 TABLET BY MOUTH EVERY DAY  90 tablet  0  . benzonatate (TESSALON) 200 MG capsule  Take 1 capsule (200 mg total) by mouth 3 (three) times daily as needed for cough.  15 capsule  0  . doxycycline (VIBRAMYCIN) 100 MG capsule Take 1 capsule (100 mg total) by mouth 2 (two) times daily.  20 capsule  0  . omeprazole (PRILOSEC) 20 MG capsule Take 1 capsule (20 mg total) by mouth daily.  90 capsule  2   No facility-administered medications prior to visit.    No Known Allergies  ROS As per HPI  PE: Blood pressure 115/75, pulse 60, temperature 99.2 F (37.3 C), temperature source Temporal, resp. rate 18, height 5\' 10"  (1.778 m), weight 158 lb (71.668 kg), SpO2 97.00%. Gen: Alert, well appearing.  Patient is oriented to person, place, time, and situation. AFFECT: pleasant, lucid thought and speech. ENT: Ears: EACs clear, normal epithelium.  TMs with good light reflex and landmarks bilaterally.  Eyes: no injection, icteris, swelling, or exudate.  EOMI, PERRLA. Nose: no drainage or turbinate edema/swelling.  No injection or focal lesion.  Mouth: lips without lesion/swelling.  Oral mucosa pink and moist.  Dentition intact and without obvious caries or gingival swelling.  Oropharynx without erythema, exudate, or swelling.  Neck: supple/nontender.  No LAD, mass, or TM.  Carotid pulses 2+ bilaterally, without bruits. CV: RRR, no m/r/g.   LUNGS: CTA bilat, nonlabored resps, good aeration in all lung fields. ABD: soft, NT, ND, BS normal.  No hepatospenomegaly or mass.  No bruits. EXT: no clubbing, cyanosis, or edema.  Musculoskeletal: no joint swelling, erythema, warmth, or tenderness.  ROM of all joints intact. Skin - no sores or suspicious lesions or rashes or color changes Neuro: CN 2-12 intact bilaterally, strength 5/5 in proximal and distal upper extremities and lower extremities bilaterally.  No sensory deficits.  No tremor.  No disdiadochokinesis.  No ataxia.  Upper extremity and lower extremity DTRs symmetric.  No pronator drift. Dix Halpike maneuvers neg bilat for vertigo or  nystagmus BUT when pt sat up he felt very brief dizziness/disequilibrium   LABS:  None today  IMPRESSION AND PLAN:  Orthostatic dizziness He is relatively dehydrated + he is on low dose lisinopril for preservation of LV function (not for hx of HTN). He drank coffee last night, ran yesterday morning, admits he is likely relatively dehydrated. Decided on no testing today and encouraged/discussed rest, avoid excess caffeine, and increase clear liquids intake. Pt understands plan and agrees.  An After Visit Summary was printed and given to the patient.  FOLLOW UP: Return if symptoms worsen or fail to improve.

## 2014-06-18 NOTE — Assessment & Plan Note (Signed)
He is relatively dehydrated + he is on low dose lisinopril for preservation of LV function (not for hx of HTN). He drank coffee last night, ran yesterday morning, admits he is likely relatively dehydrated. Decided on no testing today and encouraged/discussed rest, avoid excess caffeine, and increase clear liquids intake. Pt understands plan and agrees.

## 2014-06-18 NOTE — Progress Notes (Signed)
Pre visit review using our clinic review tool, if applicable. No additional management support is needed unless otherwise documented below in the visit note. 

## 2014-08-11 ENCOUNTER — Other Ambulatory Visit: Payer: Self-pay | Admitting: Infectious Diseases

## 2014-08-11 DIAGNOSIS — B20 Human immunodeficiency virus [HIV] disease: Secondary | ICD-10-CM

## 2014-08-23 ENCOUNTER — Encounter: Payer: Self-pay | Admitting: Infectious Disease

## 2014-08-31 ENCOUNTER — Other Ambulatory Visit: Payer: BC Managed Care – PPO

## 2014-08-31 DIAGNOSIS — E785 Hyperlipidemia, unspecified: Secondary | ICD-10-CM

## 2014-08-31 DIAGNOSIS — Z113 Encounter for screening for infections with a predominantly sexual mode of transmission: Secondary | ICD-10-CM

## 2014-08-31 DIAGNOSIS — B20 Human immunodeficiency virus [HIV] disease: Secondary | ICD-10-CM

## 2014-09-01 LAB — COMPLETE METABOLIC PANEL WITH GFR
ALT: 29 U/L (ref 0–53)
AST: 20 U/L (ref 0–37)
Albumin: 4.3 g/dL (ref 3.5–5.2)
Alkaline Phosphatase: 81 U/L (ref 39–117)
BUN: 21 mg/dL (ref 6–23)
CHLORIDE: 104 meq/L (ref 96–112)
CO2: 28 mEq/L (ref 19–32)
CREATININE: 0.98 mg/dL (ref 0.50–1.35)
Calcium: 9.1 mg/dL (ref 8.4–10.5)
GFR, EST NON AFRICAN AMERICAN: 82 mL/min
GFR, Est African American: >89 mL/min
GLUCOSE: 105 mg/dL — AB (ref 70–99)
Potassium: 4.6 mEq/L (ref 3.5–5.3)
Sodium: 139 mEq/L (ref 135–145)
Total Bilirubin: 0.4 mg/dL (ref 0.2–1.2)
Total Protein: 6.4 g/dL (ref 6.0–8.3)

## 2014-09-01 LAB — CBC WITH DIFFERENTIAL/PLATELET
BASOS ABS: 0 10*3/uL (ref 0.0–0.1)
Basophils Relative: 0 % (ref 0–1)
EOS PCT: 4 % (ref 0–5)
Eosinophils Absolute: 0.3 10*3/uL (ref 0.0–0.7)
HEMATOCRIT: 39.3 % (ref 39.0–52.0)
Hemoglobin: 13.9 g/dL (ref 13.0–17.0)
LYMPHS PCT: 26 % (ref 12–46)
Lymphs Abs: 2.1 10*3/uL (ref 0.7–4.0)
MCH: 31.4 pg (ref 26.0–34.0)
MCHC: 35.4 g/dL (ref 30.0–36.0)
MCV: 88.9 fL (ref 78.0–100.0)
MONO ABS: 0.6 10*3/uL (ref 0.1–1.0)
MONOS PCT: 7 % (ref 3–12)
Neutro Abs: 5.1 10*3/uL (ref 1.7–7.7)
Neutrophils Relative %: 63 % (ref 43–77)
Platelets: 350 10*3/uL (ref 150–400)
RBC: 4.42 MIL/uL (ref 4.22–5.81)
RDW: 14.7 % (ref 11.5–15.5)
WBC: 8.1 10*3/uL (ref 4.0–10.5)

## 2014-09-01 LAB — LIPID PANEL
Cholesterol: 137 mg/dL (ref 0–200)
HDL: 45 mg/dL (ref >39)
LDL Cholesterol: 72 mg/dL (ref 0–99)
TRIGLYCERIDES: 98 mg/dL (ref <150)
Total CHOL/HDL Ratio: 3 Ratio
VLDL: 20 mg/dL (ref 0–40)

## 2014-09-01 LAB — URINE CYTOLOGY ANCILLARY ONLY
CHLAMYDIA, DNA PROBE: NEGATIVE
Neisseria Gonorrhea: NEGATIVE

## 2014-09-01 LAB — T-HELPER CELL (CD4) - (RCID CLINIC ONLY)
CD4 T CELL ABS: 800 /uL (ref 400–2700)
CD4 T CELL HELPER: 37 % (ref 33–55)

## 2014-09-01 LAB — RPR

## 2014-09-01 LAB — HIV-1 RNA QUANT-NO REFLEX-BLD
HIV 1 RNA Quant: <20 copies/mL (ref <20)
HIV-1 RNA Quant, Log: <1.3 {Log} (ref <1.30)

## 2014-09-09 ENCOUNTER — Encounter: Payer: Self-pay | Admitting: Internal Medicine

## 2014-09-09 ENCOUNTER — Ambulatory Visit (INDEPENDENT_AMBULATORY_CARE_PROVIDER_SITE_OTHER): Payer: BC Managed Care – PPO | Admitting: Internal Medicine

## 2014-09-09 VITALS — BP 104/70 | HR 62 | Temp 98.7°F | Ht 70.0 in | Wt 157.2 lb

## 2014-09-09 DIAGNOSIS — Z0000 Encounter for general adult medical examination without abnormal findings: Secondary | ICD-10-CM

## 2014-09-09 NOTE — Progress Notes (Signed)
Subjective:    Patient ID: Edwin Padilla, male    DOB: [birth year], 63 y.o.   MRN: [id number]  HPI  patient is here today for annual physical. Patient feels well and has no complaints.  Also reviewed chronic medical issues and interval medical events  Frustrated with my lateness today and lack of schedule availability   Past Medical History  Diagnosis Date  . Allergy     Cat allergy  . Coronary atherosclerosis of unspecified type of vessel, native or graft     CABG 07/2003  . Myocardial infarction 2004  . HIV infection   . Hyperlipidemia   . Substance abuse     hx of  . Right inguinal hernia     s/p repair 03/2013  . 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 Asthma    Family History  Problem Relation Age of Onset  . Stroke Mother 47  . Heart failure Father   . Heart disease Father   . Colon cancer Father 23  . Cancer Brother 70    lymphoma   History  Substance Use Topics  . Smoking status: Former Smoker -- 35 years    Types: Cigarettes    Quit date: 12/12/2003  . Smokeless tobacco: Never Used  . Alcohol Use: No     Comment: 2005    Review of Systems  Constitutional: Positive for fatigue. Negative for fever, activity change, appetite change and unexpected weight change.  Respiratory: Negative for cough, chest tightness, shortness of breath and wheezing.   Cardiovascular: Negative for chest pain, palpitations and leg swelling.  Neurological: Negative for dizziness, weakness and headaches.  Psychiatric/Behavioral: Negative for dysphoric mood. The patient is not nervous/anxious.   All other systems reviewed and are negative.      Objective:   Physical Exam  BP 104/70  Pulse 62  Temp(Src) 98.7 F (37.1 C) (Oral)  Ht 5\' 10"  (1.778 m)  Wt 157 lb 4 oz (71.328 kg)  BMI 22.56 kg/m2  SpO2 96% Wt Readings from Last 3 Encounters:  09/09/14 157 lb 4 oz (71.328 kg)  06/18/14 158 lb (71.668 kg)  04/10/14 157 lb (71.215 kg)   Constitutional: he appears  well-developed and well-nourished. No distress.  HENT: Head: Normocephalic and atraumatic. Ears: B TMs ok, no erythema or effusion; Nose: Nose normal. Mouth/Throat: Oropharynx is clear and moist. No oropharyngeal exudate.  Eyes: Conjunctivae and EOM are normal. Pupils are equal, round, and reactive to light. No scleral icterus.  Neck: Normal range of motion. Neck supple. No JVD present. No thyromegaly present.  Cardiovascular: Normal rate, regular rhythm and normal heart sounds.  No murmur heard. No BLE edema. Pulmonary/Chest: Effort normal and breath sounds normal. No respiratory distress. he has no wheezes.  Abdominal: Soft. Bowel sounds are normal. he exhibits no distension. There is no tenderness. no masses GU: defer Musculoskeletal: Normal range of motion, no joint effusions. No gross deformities Neurological: he is alert and oriented to person, place, and time. No cranial nerve deficit. Coordination, balance, strength, speech and gait are normal.  Skin: Skin is warm and dry. No rash noted. No erythema.  Psychiatric: he has a normal mood and affect. behavior is normal. Judgment and thought content normal.  Lab Results  Component Value Date   WBC 8.1 08/31/2014   HGB 13.9 08/31/2014   HCT 39.3 08/31/2014   PLT 350 08/31/2014   GLUCOSE 105* 08/31/2014   CHOL 137 08/31/2014   TRIG 98 08/31/2014  HDL 45 08/31/2014   LDLCALC 72 08/31/2014   ALT 29 08/31/2014   AST 20 08/31/2014   NA 139 08/31/2014   K 4.6 08/31/2014   CL 104 08/31/2014   CREATININE 0.98 08/31/2014   BUN 21 08/31/2014   CO2 28 08/31/2014    No results found.     Assessment & Plan:   CPX/v70.0 Patient has been counseled on age-appropriate routine health concerns for screening and prevention. These are reviewed and up-to-date. Immunizations are up-to-date or declined. Labs reviewed.  Patient to proceed with shingles vaccination, encouraged to verify with insurance co-pay/cost prior to receiving same  Patient to change PCP,  names provided, patient will let me know transfer of records is acquired. Support offered re: appropriate as of same due to my clinical part-time status

## 2014-09-09 NOTE — Patient Instructions (Addendum)
It was good to see you today.  We have reviewed your prior records including labs and tests today  Health Maintenance reviewed - flu this fall and shingles when ok cost with insurer - all other recommended immunizations and age-appropriate screenings are up-to-date.  Medications reviewed and updated, no changes recommended at this time.  Please schedule followup in 12 months for annual exam and labs, call sooner if problems.  Health Maintenance A healthy lifestyle and preventative care can promote health and wellness.  Maintain regular health, dental, and eye exams.  Eat a healthy diet. Foods like vegetables, fruits, whole grains, low-fat dairy products, and lean protein foods contain the nutrients you need and are low in calories. Decrease your intake of foods high in solid fats, added sugars, and salt. Get information about a proper diet from your health care provider, if necessary.  Regular physical exercise is one of the most important things you can do for your health. Most adults should get at least 150 minutes of moderate-intensity exercise (any activity that increases your heart rate and causes you to sweat) each week. In addition, most adults need muscle-strengthening exercises on 2 or more days a week.   Maintain a healthy weight. The body mass index (BMI) is a screening tool to identify possible weight problems. It provides an estimate of body fat based on height and weight. Your health care provider can find your BMI and can help you achieve or maintain a healthy weight. For males 20 years and older:  A BMI below 18.5 is considered underweight.  A BMI of 18.5 to 24.9 is normal.  A BMI of 25 to 29.9 is considered overweight.  A BMI of 30 and above is considered obese.  Maintain normal blood lipids and cholesterol by exercising and minimizing your intake of saturated fat. Eat a balanced diet with plenty of fruits and vegetables. Blood tests for lipids and cholesterol should  begin at age 85 and be repeated every 5 years. If your lipid or cholesterol levels are high, you are over age 46, or you are at high risk for heart disease, you may need your cholesterol levels checked more frequently.Ongoing high lipid and cholesterol levels should be treated with medicines if diet and exercise are not working.  If you smoke, find out from your health care provider how to quit. If you do not use tobacco, do not start.  Lung cancer screening is recommended for adults aged 39-80 years who are at high risk for developing lung cancer because of a history of smoking. A yearly low-dose CT scan of the lungs is recommended for people who have at least a 30-pack-year history of smoking and are current smokers or have quit within the past 15 years. A pack year of smoking is smoking an average of 1 pack of cigarettes a day for 1 year (for example, a 30-pack-year history of smoking could mean smoking 1 pack a day for 30 years or 2 packs a day for 15 years). Yearly screening should continue until the smoker has stopped smoking for at least 15 years. Yearly screening should be stopped for people who develop a health problem that would prevent them from having lung cancer treatment.  If you choose to drink alcohol, do not have more than 2 drinks per day. One drink is considered to be 12 oz (360 mL) of beer, 5 oz (150 mL) of wine, or 1.5 oz (45 mL) of liquor.  Avoid the use of street drugs. Do not  share needles with anyone. Ask for help if you need support or instructions about stopping the use of drugs.  High blood pressure causes heart disease and increases the risk of stroke. Blood pressure should be checked at least every 1-2 years. Ongoing high blood pressure should be treated with medicines if weight loss and exercise are not effective.  If you are 47-19 years old, ask your health care provider if you should take aspirin to prevent heart disease.  Diabetes screening involves taking a blood  sample to check your fasting blood sugar level. This should be done once every 3 years after age 48 if you are at a normal weight and without risk factors for diabetes. Testing should be considered at a younger age or be carried out more frequently if you are overweight and have at least 1 risk factor for diabetes.  Colorectal cancer can be detected and often prevented. Most routine colorectal cancer screening begins at the age of 81 and continues through age 16. However, your health care provider may recommend screening at an earlier age if you have risk factors for colon cancer. On a yearly basis, your health care provider may provide home test kits to check for hidden blood in the stool. A small camera at the end of a tube may be used to directly examine the colon (sigmoidoscopy or colonoscopy) to detect the earliest forms of colorectal cancer. Talk to your health care provider about this at age 68 when routine screening begins. A direct exam of the colon should be repeated every 5-10 years through age 29, unless early forms of precancerous polyps or small growths are found.  People who are at an increased risk for hepatitis B should be screened for this virus. You are considered at high risk for hepatitis B if:  You were born in a country where hepatitis B occurs often. Talk with your health care provider about which countries are considered high risk.  Your parents were born in a high-risk country and you have not received a shot to protect against hepatitis B (hepatitis B vaccine).  You have HIV or AIDS.  You use needles to inject street drugs.  You live with, or have sex with, someone who has hepatitis B.  You are a man who has sex with other men (MSM).  You get hemodialysis treatment.  You take certain medicines for conditions like cancer, organ transplantation, and autoimmune conditions.  Hepatitis C blood testing is recommended for all people born from [birth year] through 1965 and any  individual with known risk factors for hepatitis C.  Healthy men should no longer receive prostate-specific antigen (PSA) blood tests as part of routine cancer screening. Talk to your health care provider about prostate cancer screening.  Testicular cancer screening is not recommended for adolescents or adult males who have no symptoms. Screening includes self-exam, a health care provider exam, and other screening tests. Consult with your health care provider about any symptoms you have or any concerns you have about testicular cancer.  Practice safe sex. Use condoms and avoid high-risk sexual practices to reduce the spread of sexually transmitted infections (STIs).  You should be screened for STIs, including gonorrhea and chlamydia if:  You are sexually active and are younger than 24 years.  You are older than 24 years, and your health care provider tells you that you are at risk for this type of infection.  Your sexual activity has changed since you were last screened, and you are at an  increased risk for chlamydia or gonorrhea. Ask your health care provider if you are at risk.  If you are at risk of being infected with HIV, it is recommended that you take a prescription medicine daily to prevent HIV infection. This is called pre-exposure prophylaxis (PrEP). You are considered at risk if:  You are a man who has sex with other men (MSM).  You are a heterosexual man who is sexually active with multiple partners.  You take drugs by injection.  You are sexually active with a partner who has HIV.  Talk with your health care provider about whether you are at high risk of being infected with HIV. If you choose to begin PrEP, you should first be tested for HIV. You should then be tested every 3 months for as long as you are taking PrEP.  Use sunscreen. Apply sunscreen liberally and repeatedly throughout the day. You should seek shade when your shadow is shorter than you. Protect yourself by  wearing long sleeves, pants, a wide-brimmed hat, and sunglasses year round whenever you are outdoors.  Tell your health care provider of new moles or changes in moles, especially if there is a change in shape or color. Also, tell your health care provider if a mole is larger than the size of a pencil eraser.  A one-time screening for abdominal aortic aneurysm (AAA) and surgical repair of large AAAs by ultrasound is recommended for men aged 96-75 years who are current or former smokers.  Stay current with your vaccines (immunizations). Document Released: 05/25/2008 Document Revised: 12/02/2013 Document Reviewed: 04/24/2011 [facility] Patient Information 2015 [place], [place]. This information is not intended to replace advice given to you by your health care provider. Make sure you discuss any questions you have with your health care provider.

## 2014-09-09 NOTE — Progress Notes (Signed)
Pre visit review using our clinic review tool, if applicable. No additional management support is needed unless otherwise documented below in the visit note. 

## 2014-09-14 ENCOUNTER — Encounter: Payer: Self-pay | Admitting: Infectious Disease

## 2014-09-14 ENCOUNTER — Ambulatory Visit (INDEPENDENT_AMBULATORY_CARE_PROVIDER_SITE_OTHER): Payer: BC Managed Care – PPO | Admitting: Infectious Disease

## 2014-09-14 VITALS — BP 109/70 | HR 53 | Temp 99.0°F | Wt 160.0 lb

## 2014-09-14 DIAGNOSIS — I10 Essential (primary) hypertension: Secondary | ICD-10-CM

## 2014-09-14 DIAGNOSIS — Z21 Asymptomatic human immunodeficiency virus [HIV] infection status: Secondary | ICD-10-CM

## 2014-09-14 DIAGNOSIS — I2510 Atherosclerotic heart disease of native coronary artery without angina pectoris: Secondary | ICD-10-CM

## 2014-09-14 DIAGNOSIS — Z23 Encounter for immunization: Secondary | ICD-10-CM | POA: Diagnosis not present

## 2014-09-14 DIAGNOSIS — B20 Human immunodeficiency virus [HIV] disease: Secondary | ICD-10-CM

## 2014-09-14 NOTE — Progress Notes (Signed)
  Subjective:    Patient ID: Edwin Padilla, male    DOB: [birth year], 63 y.o.   MRN: [id number]  HPI   Edwin Padilla is a 63 y.o. male who is doing superbly well on his  antiviral regimen, on atripla with undetectable viral load and health cd4 count. He had established primary care with [name] but now is unhappy with [facility] and wants to go back to different practice, probably [facility]. (he had preference for Dr. Volanda Napoleon who has since moved)   He is otherwise tolerating his Atripla quite well we discussed other possible single tablet regimens including complera, Stribild and complera but he does not wish to make any change yet. He is interested in going to TAF based regimen based on better bone health that he could achieve if changed to TAF vs TDF. \    Review of Systems  Constitutional: Negative for fever, chills, diaphoresis, activity change, appetite change, fatigue and unexpected weight change.  HENT: Negative for sinus pressure and trouble swallowing.   Eyes: Negative for photophobia and visual disturbance.  Respiratory: Negative for chest tightness, shortness of breath and stridor.   Cardiovascular: Negative for palpitations and leg swelling.  Gastrointestinal: Negative for constipation, blood in stool, abdominal distention and anal bleeding.  Genitourinary: Negative for hematuria, flank pain and difficulty urinating.  Musculoskeletal: Negative for arthralgias, back pain, gait problem, joint swelling and myalgias.  Skin: Negative for color change, pallor and wound.  Neurological: Positive for weakness. Negative for dizziness, tremors and light-headedness.  Hematological: Negative for adenopathy. Does not bruise/bleed easily.  Psychiatric/Behavioral: Negative for behavioral problems, confusion, sleep disturbance, dysphoric mood, decreased concentration and agitation.       Objective:   Physical Exam  Constitutional: He is oriented to person, place, and time. He appears  well-developed and well-nourished. No distress.  HENT:  Head: Normocephalic and atraumatic.  Mouth/Throat: Oropharynx is clear and moist. No oropharyngeal exudate.  Eyes: Conjunctivae and EOM are normal. Pupils are equal, round, and reactive to light. No scleral icterus.  Neck: Normal range of motion. Neck supple. No JVD present.  Cardiovascular: Normal rate, regular rhythm and normal heart sounds.  Exam reveals no gallop and no friction rub.   No murmur heard. Pulmonary/Chest: Effort normal and breath sounds normal. No respiratory distress. He has no wheezes. He has no rales. He exhibits no tenderness.  Abdominal: He exhibits no distension and no mass. There is no tenderness. There is no rebound and no guarding.  Musculoskeletal: He exhibits no edema and no tenderness.  Lymphadenopathy:    He has no cervical adenopathy.  Neurological: He is alert and oriented to person, place, and time. He has normal reflexes. He exhibits normal muscle tone. Coordination normal.  Skin: Skin is warm and dry. He is not diaphoretic. No erythema. No pallor.  Psychiatric: He has a normal mood and affect. His behavior is normal. Judgment and thought content normal.          Assessment & Plan:   HIV: continue atripla. Likely change to Complera-TAF vs STRIBILD TAF vs TAF-truvada with [facility]. I spent greater than 25 minutes with the patient including greater than 50% of time in face to face counsel of the patient and in coordination of their care.   MBW:GYKZLDJ 40mg , he is on asa and ACEI  HTN : well controlled  Need for flu and zoster vaccines: getting flu vax at work will get zostavax here.

## 2014-11-06 ENCOUNTER — Other Ambulatory Visit: Payer: Self-pay | Admitting: Infectious Diseases

## 2015-03-15 ENCOUNTER — Encounter: Payer: Self-pay | Admitting: Family Medicine

## 2015-03-15 ENCOUNTER — Ambulatory Visit (INDEPENDENT_AMBULATORY_CARE_PROVIDER_SITE_OTHER): Payer: BLUE CROSS/BLUE SHIELD | Admitting: Family Medicine

## 2015-03-15 VITALS — BP 98/62 | HR 53 | Temp 98.3°F | Ht 70.0 in | Wt 154.0 lb

## 2015-03-15 DIAGNOSIS — Z79899 Other long term (current) drug therapy: Secondary | ICD-10-CM

## 2015-03-15 DIAGNOSIS — Z125 Encounter for screening for malignant neoplasm of prostate: Secondary | ICD-10-CM

## 2015-03-15 DIAGNOSIS — Z0000 Encounter for general adult medical examination without abnormal findings: Secondary | ICD-10-CM

## 2015-03-15 DIAGNOSIS — B20 Human immunodeficiency virus [HIV] disease: Secondary | ICD-10-CM

## 2015-03-15 DIAGNOSIS — Z23 Encounter for immunization: Secondary | ICD-10-CM | POA: Diagnosis not present

## 2015-03-15 NOTE — Progress Notes (Signed)
Office Note 03/15/2015  CC:  Chief Complaint  Patient presents with  . Establish Care   HPI:  Edwin Padilla is a 64 y.o. White male who is here to establish care. Transferring from Dr. Asa Lente at [facility] on [address] b/c she is tapering off from clinical practice. He has HIV, most recent blood testing was 08/31/14: CBC normal, CD4 count 800, normal CBC, CMET, and lipids.  HIV quant <20.  RPR nonreactive, GC and Chlam screening neg.  He has repeat/future labs already ordered by Dr. Tommy Medal but no f/u appt with ID at this time. He will be making the f/u appt ASAP, and I'll add PSA testing to his labs that will be drawn at ID clinic when fasting. He needs prevnar 13 IM, otherwise he is UTD on vaccines.  No vision complaints but he is overdue for routine vision/ophthalm exam (last one was 10 yrs ago). Dental care UTD.  No acute complaints, asks for his CPE to be done today.   Past Medical History  Diagnosis Date  . Allergy     Cat allergy  . Coronary atherosclerosis of unspecified type of vessel, native or graft     CABG 07/2003; annual cardiology f/u (Dr. Pernell Dupre)  . Myocardial infarction 2004  . HIV infection Dx 02/2002    ID clinic w/ [facility].  . Hyperlipidemia   . Substance abuse     hx of  . Right inguinal hernia     s/p repair 03/2013  . 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 Asthma     Past Surgical History  Procedure Laterality Date  . Coronary artery bypass graft  2004  . Hernia repair      as infant  . Colonoscopy  01/2011    Normal (recall 10 yrs)  . Tonsillectomy    . Inguinal hernia repair Right 04/09/2013    Procedure: RIGHT HERNIA REPAIR INGUINAL ADULT;  Surgeon: Haywood Lasso, MD;  Location: [facility];  Service: General;  Laterality: Right;    Family History  Problem Relation Age of Onset  . Stroke Mother 52  . Heart failure Father   . Heart disease Father   . Colon cancer Father 91  . Cancer Brother 23    lymphoma     History   Social History  . Marital Status: Single    Spouse Name: N/A  . Number of Children: N/A  . Years of Education: N/A   Occupational History  . Not on file.   Social History Main Topics  . Smoking status: Former Smoker -- 35 years    Types: Cigarettes    Quit date: 12/12/2003  . Smokeless tobacco: Never Used  . Alcohol Use: No     Comment: 2005  . Drug Use: No     Comment: past history of crystal meth addiction -clean since 2005   . Sexual Activity: Not Currently     Comment: 2005   Other Topics Concern  . Not on file   Social History Narrative   Homosexual.   Never married.   Hx of methamph abuse; no IV drug use.  [facility] remission drug program, no use since 2005.   Exercises regularly, says he eats a good diet.   Tob: quit 2004, 50 pack-yr hx.   No alcohol.    Outpatient Prescriptions Prior to Visit  Medication Sig Dispense Refill  . aspirin 81 MG chewable tablet Chew 81 mg by mouth daily.      Marland Kitchen  atorvastatin (LIPITOR) 40 MG tablet Take 1 tablet (40 mg total) by mouth daily. 90 tablet 3  . ATRIPLA 600-200-300 MG per tablet TAKE 1 TABLET BY MOUTH EVERY NIGHT AT BEDTIME 90 tablet 2  . lisinopril (PRINIVIL,ZESTRIL) 5 MG tablet TAKE 1 TABLET BY MOUTH EVERY DAY 90 tablet 2   No facility-administered medications prior to visit.    No Known Allergies  ROS Review of Systems  Constitutional: Negative for fever, chills, appetite change and fatigue.  HENT: Negative for congestion, dental problem, ear pain and sore throat.   Eyes: Negative for discharge, redness and visual disturbance.  Respiratory: Negative for cough, chest tightness, shortness of breath and wheezing.   Cardiovascular: Negative for chest pain, palpitations and leg swelling.  Gastrointestinal: Negative for nausea, vomiting, abdominal pain, diarrhea and blood in stool.  Genitourinary: Negative for dysuria, urgency, frequency, hematuria, flank pain and difficulty urinating.  Musculoskeletal:  Negative for myalgias, back pain, joint swelling, arthralgias and neck stiffness.  Skin: Negative for pallor and rash.  Neurological: Negative for dizziness, speech difficulty, weakness and headaches.  Hematological: Negative for adenopathy. Does not bruise/bleed easily.  Psychiatric/Behavioral: Negative for confusion and sleep disturbance. The patient is not nervous/anxious.     PE; Blood pressure 98/62, pulse 53, temperature 98.3 F (36.8 C), temperature source Oral, height 5\' 10"  (1.778 m), weight 154 lb (69.854 kg), SpO2 98 %. Gen: Alert, well appearing, thin male w/out emaciation.  Patient is oriented to person, place, time, and situation. AFFECT: pleasant, lucid thought and speech. ENT: Ears: EACs clear, normal epithelium.  TMs with good light reflex and landmarks bilaterally.  Eyes: no injection, icteris, swelling, or exudate.  EOMI, PERRLA. Nose: no drainage or turbinate edema/swelling.  No injection or focal lesion.  Mouth: lips without lesion/swelling.  Oral mucosa pink and moist.  Dentition intact and without obvious caries or gingival swelling.  Oropharynx without erythema, exudate, or swelling.  Neck: supple/nontender.  No LAD, mass, or TM.   CV: RRR, no m/r/g.   LUNGS: CTA bilat, nonlabored resps, good aeration in all lung fields. ABD: soft, NT, ND, BS normal.  No hepatospenomegaly or mass.  No bruits. EXT: no clubbing, cyanosis, or edema.  Musculoskeletal: no joint swelling, erythema, warmth, or tenderness.  ROM of all joints intact. Skin - no sores or suspicious lesions or rashes or color changes Rectal exam: negative without mass, lesions or tenderness.   Prostate is smooth, nontender, without enlargement or nodularity or asymmetry.     Pertinent labs:  None today  ASSESSMENT AND PLAN:   Transfer pt:  Health maintenance examination Reviewed age and gender appropriate health maintenance issues (prudent diet, regular exercise, health risks of tobacco and excessive  alcohol, use of seatbelts, fire alarms in home, use of sunscreen).  Also reviewed age and gender appropriate health screening as well as vaccine recommendations. Prevnar 13 IM today. UTD on colon ca screening. DRE today normal, PSA ordered and will be done with his upcoming fasting labs to be done when he has ID clinic f/u for his HIV disease. He seems to be in very good health.  Continue all current meds, f/u with ID clinic. F/u with cardiology annually. I referred him to ophthalmology today for exam due to HIV dz and high risk med use + he was told 10 yrs ago at last exam that "I would definitely have cataracts in 5 yrs".  Fortunately, he denies any vision complaints.   An After Visit Summary was printed and given to the patient.  FOLLOW UP:  Return for annual CPE (fasting).

## 2015-03-15 NOTE — Assessment & Plan Note (Signed)
Reviewed age and gender appropriate health maintenance issues (prudent diet, regular exercise, health risks of tobacco and excessive alcohol, use of seatbelts, fire alarms in home, use of sunscreen).  Also reviewed age and gender appropriate health screening as well as vaccine recommendations. Prevnar 13 IM today. UTD on colon ca screening. DRE today normal, PSA ordered and will be done with his upcoming fasting labs to be done when he has ID clinic f/u for his HIV disease. He seems to be in very good health.  Continue all current meds, f/u with ID clinic. F/u with cardiology annually. I referred him to ophthalmology today for exam due to HIV dz and high risk med use + he was told 10 yrs ago at last exam that "I would definitely have cataracts in 5 yrs".  Fortunately, he denies any vision complaints.

## 2015-03-15 NOTE — Addendum Note (Signed)
Addended by: Julieta Bellini on: 03/15/2015 10:07 AM   Modules accepted: Orders

## 2015-03-15 NOTE — Progress Notes (Signed)
Pre visit review using our clinic review tool, if applicable. No additional management support is needed unless otherwise documented below in the visit note. 

## 2015-04-01 ENCOUNTER — Telehealth: Payer: Self-pay | Admitting: Family Medicine

## 2015-04-01 NOTE — Telephone Encounter (Signed)
Rec'd call from [facility], after reviewing the notes Dr. Zigmund Daniel is requesting that the patient see an optometrist first. If the optometrist feels that he should pursue referral then patient has been advised to call their office back to re-schedule appt. Patient cancelled Dr. Zigmund Daniel appt & will call their office back if optometrist sees anything.

## 2015-04-05 ENCOUNTER — Encounter (INDEPENDENT_AMBULATORY_CARE_PROVIDER_SITE_OTHER): Payer: Self-pay | Admitting: Ophthalmology

## 2015-05-14 ENCOUNTER — Other Ambulatory Visit: Payer: Self-pay | Admitting: Infectious Disease

## 2015-05-19 ENCOUNTER — Other Ambulatory Visit: Payer: Self-pay | Admitting: *Deleted

## 2015-05-19 DIAGNOSIS — E785 Hyperlipidemia, unspecified: Secondary | ICD-10-CM

## 2015-05-19 MED ORDER — ATORVASTATIN CALCIUM 40 MG PO TABS: 40.0000 mg | ORAL_TABLET | Freq: Every day | ORAL | Status: DC

## 2015-05-19 NOTE — Telephone Encounter (Signed)
Jine from [facility] called stating that insurance is now requiring pts PCP to fill Atorvastatin (this was being filled by Dr. Roderic Scarce) [place] 03/15/15, no up coming ov. Please review and advise. Thanks.

## 2015-06-07 ENCOUNTER — Encounter: Payer: Self-pay | Admitting: Family Medicine

## 2015-06-07 ENCOUNTER — Ambulatory Visit (INDEPENDENT_AMBULATORY_CARE_PROVIDER_SITE_OTHER): Payer: BLUE CROSS/BLUE SHIELD | Admitting: Family Medicine

## 2015-06-07 VITALS — BP 117/73 | HR 62 | Temp 98.3°F | Resp 16 | Ht 70.0 in | Wt 152.0 lb

## 2015-06-07 DIAGNOSIS — R05 Cough: Secondary | ICD-10-CM | POA: Diagnosis not present

## 2015-06-07 DIAGNOSIS — J4520 Mild intermittent asthma, uncomplicated: Secondary | ICD-10-CM | POA: Diagnosis not present

## 2015-06-07 DIAGNOSIS — R059 Cough, unspecified: Secondary | ICD-10-CM

## 2015-06-07 DIAGNOSIS — B20 Human immunodeficiency virus [HIV] disease: Secondary | ICD-10-CM | POA: Diagnosis not present

## 2015-06-07 MED ORDER — ALBUTEROL SULFATE HFA 108 (90 BASE) MCG/ACT IN AERS: 2.0000 | INHALATION_SPRAY | Freq: Four times a day (QID) | RESPIRATORY_TRACT | Status: DC | PRN

## 2015-06-07 NOTE — Progress Notes (Signed)
OFFICE VISIT  06/07/2015   CC:  Chief Complaint  Patient presents with  . Cough    x 10 days, productive   HPI:    Patient is a 64 y.o. Caucasian male who presents for cough.  Onset about 10d/a, gradually worsening. No significant nasal congestion or runny nose or PND. At night he feels some chest tightness and wheezing.  No albut inhaler use. OTC delsym used.  No fevers.  He has HIV, most recent blood testing was 08/31/14: CBC normal, CD4 count 800, normal CBC, CMET, and lipids. HIV quant <20. RPR nonreactive, GC and Chlam screening neg.  Past Medical History  Diagnosis Date  . Allergy     Cat allergy  . Coronary atherosclerosis of unspecified type of vessel, native or graft     CABG 07/2003; annual cardiology f/u (Dr. Pernell Dupre)  . Myocardial infarction 2004  . HIV infection Dx 02/2002    ID clinic w/ [facility].  . Hyperlipidemia   . Substance abuse     hx of  . Right inguinal hernia     s/p repair 03/2013  . 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 Asthma     Past Surgical History  Procedure Laterality Date  . Coronary artery bypass graft  2004  . Hernia repair      as infant  . Colonoscopy  01/2011    Normal (recall 10 yrs)  . Tonsillectomy    . Inguinal hernia repair Right 04/09/2013    Procedure: RIGHT HERNIA REPAIR INGUINAL ADULT;  Surgeon: Haywood Lasso, MD;  Location: [place];  Service: General;  Laterality: Right;    Outpatient Prescriptions Prior to Visit  Medication Sig Dispense Refill  . aspirin 81 MG chewable tablet Chew 81 mg by mouth daily.      Marland Kitchen atorvastatin (LIPITOR) 40 MG tablet Take 1 tablet (40 mg total) by mouth daily. 90 tablet 3  . ATRIPLA 600-200-300 MG per tablet TAKE 1 TABLET BY MOUTH EVERY NIGHT AT BEDTIME 90 tablet 2  . lisinopril (PRINIVIL,ZESTRIL) 5 MG tablet TAKE 1 TABLET BY MOUTH EVERY DAY 90 tablet 2   No facility-administered medications prior to visit.    No Known Allergies  ROS As per  HPI  PE: Blood pressure 117/73, pulse 62, temperature 98.3 F (36.8 C), temperature source Oral, resp. rate 16, height 5\' 10"  (1.778 m), weight 152 lb (68.947 kg), SpO2 95 %. Gen: Alert, well appearing.  Patient is oriented to person, place, time, and situation. ZHY:QMVH: no injection, icteris, swelling, or exudate.  EOMI, PERRLA. Mouth: lips without lesion/swelling.  Oral mucosa pink and moist. Oropharynx without erythema, exudate, or swelling.  Neck - No masses or thyromegaly or limitation in range of motion CV: RRR, no m/r/g.   LUNGS: CTA bilat, nonlabored resps, good aeration in all lung fields. EXT: no clubbing, cyanosis, or edema.   LABS:  none  IMPRESSION AND PLAN:  1) Cough, with hx of mild intermittent asthma. Exam normal today, pt appears well. He does have HIV disease, making him higher risk for various infectious etiologies. Last lab monitoring 09/2014 was good.  No labs today. CXR ordered today. Ventolin HFA 1-2 puffs q6h prn. Mucinex DM q12h prn.  An After Visit Summary was printed and given to the patient.  FOLLOW UP: Return if symptoms worsen or fail to improve.

## 2015-06-07 NOTE — Progress Notes (Signed)
Pre visit review using our clinic review tool, if applicable. No additional management support is needed unless otherwise documented below in the visit note. 

## 2015-06-23 ENCOUNTER — Other Ambulatory Visit: Payer: BLUE CROSS/BLUE SHIELD

## 2015-07-14 ENCOUNTER — Ambulatory Visit: Payer: BLUE CROSS/BLUE SHIELD | Admitting: Infectious Disease

## 2015-07-28 ENCOUNTER — Other Ambulatory Visit: Payer: BLUE CROSS/BLUE SHIELD

## 2015-07-28 DIAGNOSIS — B20 Human immunodeficiency virus [HIV] disease: Secondary | ICD-10-CM

## 2015-07-28 LAB — CBC WITH DIFFERENTIAL/PLATELET
BASOS ABS: 0 10*3/uL (ref 0.0–0.1)
BASOS PCT: 0 % (ref 0–1)
EOS ABS: 0.4 10*3/uL (ref 0.0–0.7)
Eosinophils Relative: 5 % (ref 0–5)
HCT: 38.8 % — ABNORMAL LOW (ref 39.0–52.0)
HEMOGLOBIN: 13.7 g/dL (ref 13.0–17.0)
Lymphocytes Relative: 30 % (ref 12–46)
Lymphs Abs: 2.3 10*3/uL (ref 0.7–4.0)
MCH: 31.6 pg (ref 26.0–34.0)
MCHC: 35.3 g/dL (ref 30.0–36.0)
MCV: 89.6 fL (ref 78.0–100.0)
MONOS PCT: 9 % (ref 3–12)
MPV: 9.9 fL (ref 8.6–12.4)
Monocytes Absolute: 0.7 10*3/uL (ref 0.1–1.0)
NEUTROS ABS: 4.3 10*3/uL (ref 1.7–7.7)
NEUTROS PCT: 56 % (ref 43–77)
PLATELETS: 308 10*3/uL (ref 150–400)
RBC: 4.33 MIL/uL (ref 4.22–5.81)
RDW: 14.7 % (ref 11.5–15.5)
WBC: 7.7 10*3/uL (ref 4.0–10.5)

## 2015-07-28 LAB — COMPLETE METABOLIC PANEL WITH GFR
ALBUMIN: 4.1 g/dL (ref 3.6–5.1)
ALT: 43 U/L (ref 9–46)
AST: 26 U/L (ref 10–35)
Alkaline Phosphatase: 65 U/L (ref 40–115)
BUN: 18 mg/dL (ref 7–25)
CO2: 25 mmol/L (ref 20–31)
Calcium: 9.3 mg/dL (ref 8.6–10.3)
Chloride: 102 mmol/L (ref 98–110)
Creat: 1.06 mg/dL (ref 0.70–1.25)
GFR, EST NON AFRICAN AMERICAN: 74 mL/min (ref >=60)
GFR, Est African American: 86 mL/min (ref >=60)
GLUCOSE: 97 mg/dL (ref 65–99)
POTASSIUM: 4.5 mmol/L (ref 3.5–5.3)
SODIUM: 137 mmol/L (ref 135–146)
TOTAL PROTEIN: 6.3 g/dL (ref 6.1–8.1)
Total Bilirubin: 0.5 mg/dL (ref 0.2–1.2)

## 2015-07-28 LAB — LIPID PANEL
Cholesterol: 141 mg/dL (ref 125–200)
HDL: 52 mg/dL (ref >=40)
LDL Cholesterol: 74 mg/dL (ref <130)
Total CHOL/HDL Ratio: 2.7 Ratio (ref <=5.0)
Triglycerides: 73 mg/dL (ref <150)
VLDL: 15 mg/dL (ref <30)

## 2015-07-29 LAB — HIV-1 RNA QUANT-NO REFLEX-BLD: HIV 1 RNA Quant: <20 copies/mL (ref <20)

## 2015-07-29 LAB — URINE CYTOLOGY ANCILLARY ONLY
Chlamydia: NEGATIVE
Neisseria Gonorrhea: NEGATIVE

## 2015-07-29 LAB — T-HELPER CELL (CD4) - (RCID CLINIC ONLY)
CD4 T CELL HELPER: 39 % (ref 33–55)
CD4 T Cell Abs: 930 /uL (ref 400–2700)

## 2015-07-29 LAB — RPR

## 2015-08-11 ENCOUNTER — Ambulatory Visit (INDEPENDENT_AMBULATORY_CARE_PROVIDER_SITE_OTHER): Payer: BLUE CROSS/BLUE SHIELD | Admitting: Infectious Disease

## 2015-08-11 ENCOUNTER — Other Ambulatory Visit: Payer: BLUE CROSS/BLUE SHIELD

## 2015-08-11 ENCOUNTER — Encounter: Payer: Self-pay | Admitting: Infectious Disease

## 2015-08-11 VITALS — BP 113/72 | HR 61 | Temp 98.6°F | Wt 157.0 lb

## 2015-08-11 DIAGNOSIS — E785 Hyperlipidemia, unspecified: Secondary | ICD-10-CM

## 2015-08-11 DIAGNOSIS — F1510 Other stimulant abuse, uncomplicated: Secondary | ICD-10-CM | POA: Diagnosis not present

## 2015-08-11 DIAGNOSIS — B20 Human immunodeficiency virus [HIV] disease: Secondary | ICD-10-CM

## 2015-08-11 DIAGNOSIS — I25810 Atherosclerosis of coronary artery bypass graft(s) without angina pectoris: Secondary | ICD-10-CM

## 2015-08-11 DIAGNOSIS — Z21 Asymptomatic human immunodeficiency virus [HIV] infection status: Secondary | ICD-10-CM | POA: Diagnosis not present

## 2015-08-11 MED ORDER — EMTRICITAB-RILPIVIR-TENOFOV AF 200-25-25 MG PO TABS: 1.0000 | ORAL_TABLET | Freq: Every day | ORAL | Status: DC

## 2015-08-11 NOTE — Progress Notes (Signed)
  Subjective:    Patient ID: [name], male    DOB: [birth year], 64 y.o.   MRN: [id number]  HPI   KAYMAN SNUFFER is a 64 y.o. male who is doing superbly well on his  antiviral regimen, on atripla with undetectable viral load and health cd4 count. He is in primary care with [place].   We had another extensive discussion with regards to changing to a more modern single tablet regimen that would be more safe to his bones and kidneys and avoid the psycho active properties of Sustiva.  Review of Systems  Constitutional: Negative for fever, chills, diaphoresis, activity change, appetite change, fatigue and unexpected weight change.  HENT: Negative for sinus pressure and trouble swallowing.   Eyes: Negative for photophobia and visual disturbance.  Respiratory: Negative for chest tightness, shortness of breath and stridor.   Cardiovascular: Negative for palpitations and leg swelling.  Gastrointestinal: Negative for constipation, blood in stool, abdominal distention and anal bleeding.  Genitourinary: Negative for hematuria, flank pain and difficulty urinating.  Musculoskeletal: Negative for myalgias, back pain, joint swelling, arthralgias and gait problem.  Skin: Negative for color change, pallor and wound.  Neurological: Negative for dizziness, tremors and light-headedness.  Hematological: Negative for adenopathy. Does not bruise/bleed easily.  Psychiatric/Behavioral: Positive for sleep disturbance. Negative for behavioral problems, confusion, dysphoric mood, decreased concentration and agitation.       Objective:   Physical Exam  Constitutional: He is oriented to person, place, and time. He appears well-developed and well-nourished. No distress.  HENT:  Head: Normocephalic and atraumatic.  Eyes: Conjunctivae and EOM are normal. No scleral icterus.  Neck: Normal range of motion. Neck supple. No JVD present.  Cardiovascular: Normal rate, regular rhythm and normal heart sounds.    Pulmonary/Chest: Effort normal and breath sounds normal. No respiratory distress.  Abdominal: He exhibits no distension.  Musculoskeletal: He exhibits no edema or tenderness.  Lymphadenopathy:    He has no cervical adenopathy.  Neurological: He is alert and oriented to person, place, and time. He has normal reflexes. He exhibits normal muscle tone. Coordination normal.  Skin: Skin is warm and dry. He is not diaphoretic. No erythema. No pallor.  Psychiatric: He has a normal mood and affect. His behavior is normal. Judgment and thought content normal.          Assessment & Plan:   HIV: Switch to ODEFSEY today and recheck labs in one month's time  ESP:QZRAQTM 40mg , he is on asa and ACEI  HTN : well controlled  Need for flu vaccines: getting flu vax at work  I spent greater than 40 minutes with the patient including greater than 50% of time in face to face counsel of the patient regarding his HIV reviewing all of his pertinent laboratory data and interpreting this with him and reviewing his new anti-retroviral medication regimen and how he is to take this medicine and what meds he is to avoid with it and also with regards to counseling of all turned of regimens and with regards to his coronary artery disease and hypertension and in coordination of their care.

## 2015-08-17 ENCOUNTER — Other Ambulatory Visit: Payer: Self-pay | Admitting: Infectious Disease

## 2015-08-18 ENCOUNTER — Other Ambulatory Visit: Payer: Self-pay | Admitting: Family Medicine

## 2015-08-18 NOTE — Telephone Encounter (Signed)
RF request for lisinopril LOV: 03/15/15 Next ov: 03/15/16 Last written: 11/09/14 #90 w/ 2RF

## 2015-09-07 ENCOUNTER — Telehealth: Payer: Self-pay | Admitting: Infectious Disease

## 2015-09-07 NOTE — Telephone Encounter (Signed)
Patient is willing to give this medication a try, and he will call us if the symptoms persist or worsen.

## 2015-09-07 NOTE — Telephone Encounter (Signed)
He mentioned that he could subconsciously be anxious and not realize it. He will try for a little bit longer.

## 2015-09-07 NOTE — Telephone Encounter (Signed)
Patient called concerned that he has started having side effects on Odefsey, for the past 5 days. He has fatigue, blurry vision, muscle weakness, insomnia, shortness of breath and dizziness. Patient stated that he did not have any problems with Atripla. Please advise

## 2015-09-07 NOTE — Telephone Encounter (Signed)
Very good I really again dont think this constellation of symptoms makes any sense for all being due to [facility] unless it is being driven by his anxiety

## 2015-09-07 NOTE — Telephone Encounter (Signed)
I have NEVER ever heard of ANY of those symptoms occuring with ODEFSEY. He is probably overly anxious, which he was about changing. He is welcome to go back to the Atripla. This is highly likely to be ALL anxiety as that constellation of symptoms does not make sense

## 2015-09-07 NOTE — Telephone Encounter (Signed)
If he cannot tolerate it then fine to go back to [place] but this as I have said sounds like an anxiety driven reaction

## 2015-09-24 ENCOUNTER — Encounter: Payer: Self-pay | Admitting: Infectious Disease

## 2015-09-29 ENCOUNTER — Encounter: Payer: Self-pay | Admitting: Infectious Disease

## 2015-09-29 NOTE — Telephone Encounter (Signed)
Please see the patient's mychart message and advise. Thank you!  Sharyn Lull

## 2015-09-30 NOTE — Telephone Encounter (Signed)
Left message letting patient know that   Response from Dr. Tommy Medal: I very much doubt that ALL of these effects from [facility] so he can go ahead and just change back to Atripla. If he wants better bone kidney friendly regimen than atripla that is biologically the same he could go on sustiva and Descovy (since they are not remaking atripla). I would personally recommend Tivicay and Descovy as best side effect free potent and kidney and bone friendly regimen and then go to gilead new drug that is similar to this regimen when it is FDA approved in 1+ years

## 2015-10-05 ENCOUNTER — Other Ambulatory Visit: Payer: 59

## 2015-10-05 DIAGNOSIS — B20 Human immunodeficiency virus [HIV] disease: Secondary | ICD-10-CM

## 2015-10-05 LAB — CBC WITH DIFFERENTIAL/PLATELET
BASOS ABS: 0 10*3/uL (ref 0.0–0.1)
BASOS PCT: 0 % (ref 0–1)
EOS ABS: 0.5 10*3/uL (ref 0.0–0.7)
Eosinophils Relative: 7 % — ABNORMAL HIGH (ref 0–5)
HCT: 38.2 % — ABNORMAL LOW (ref 39.0–52.0)
Hemoglobin: 13.2 g/dL (ref 13.0–17.0)
Lymphocytes Relative: 36 % (ref 12–46)
Lymphs Abs: 2.5 10*3/uL (ref 0.7–4.0)
MCH: 31.1 pg (ref 26.0–34.0)
MCHC: 34.6 g/dL (ref 30.0–36.0)
MCV: 89.9 fL (ref 78.0–100.0)
MONOS PCT: 9 % (ref 3–12)
MPV: 10.5 fL (ref 8.6–12.4)
Monocytes Absolute: 0.6 10*3/uL (ref 0.1–1.0)
NEUTROS PCT: 48 % (ref 43–77)
Neutro Abs: 3.4 10*3/uL (ref 1.7–7.7)
PLATELETS: 303 10*3/uL (ref 150–400)
RBC: 4.25 MIL/uL (ref 4.22–5.81)
RDW: 14.2 % (ref 11.5–15.5)
WBC: 7 10*3/uL (ref 4.0–10.5)

## 2015-10-05 LAB — COMPLETE METABOLIC PANEL WITH GFR
ALT: 34 U/L (ref 9–46)
AST: 20 U/L (ref 10–35)
Albumin: 4.1 g/dL (ref 3.6–5.1)
Alkaline Phosphatase: 62 U/L (ref 40–115)
BILIRUBIN TOTAL: 0.7 mg/dL (ref 0.2–1.2)
BUN: 23 mg/dL (ref 7–25)
CO2: 27 mmol/L (ref 20–31)
Calcium: 9.6 mg/dL (ref 8.6–10.3)
Chloride: 104 mmol/L (ref 98–110)
Creat: 1.02 mg/dL (ref 0.70–1.25)
GFR, EST NON AFRICAN AMERICAN: 77 mL/min (ref >=60)
GFR, Est African American: 89 mL/min (ref >=60)
GLUCOSE: 88 mg/dL (ref 65–99)
Potassium: 4.8 mmol/L (ref 3.5–5.3)
Sodium: 140 mmol/L (ref 135–146)
TOTAL PROTEIN: 6.4 g/dL (ref 6.1–8.1)

## 2015-10-06 LAB — HIV-1 RNA QUANT-NO REFLEX-BLD
HIV 1 RNA Quant: 21 copies/mL (ref <20)
HIV-1 RNA QUANT, LOG: 1.32 {Log_copies}/mL — AB (ref <1.30)

## 2015-10-07 LAB — T-HELPER CELL (CD4) - (RCID CLINIC ONLY)
CD4 % Helper T Cell: 38 % (ref 33–55)
CD4 T CELL ABS: 970 /uL (ref 400–2700)

## 2015-10-12 ENCOUNTER — Telehealth: Payer: Self-pay | Admitting: *Deleted

## 2015-10-12 DIAGNOSIS — B20 Human immunodeficiency virus [HIV] disease: Secondary | ICD-10-CM

## 2015-10-12 MED ORDER — EFAVIRENZ-EMTRICITAB-TENOFOVIR 600-200-300 MG PO TABS: 1.0000 | ORAL_TABLET | Freq: Every day | ORAL | Status: DC

## 2015-10-12 NOTE — Telephone Encounter (Signed)
Changed pt back to Atripla.  Sent to pt's new mail order pharmacy.  Pt still planning to keep next week's appt with Dr. Tommy Medal.

## 2015-10-12 NOTE — Telephone Encounter (Signed)
Pt concerned re: recent VL, Odefsey causing SE, wants to restart Atripla prior to has next appt.  He has a return appt 10/21/15.  MD please advise ASAP.

## 2015-10-12 NOTE — Addendum Note (Signed)
Addended by: Lorne Skeens D on: 10/12/2015 03:41 PM   Modules accepted: Orders, Medications

## 2015-10-12 NOTE — Telephone Encounter (Signed)
There is ABSOLUTELY NOTHING WRONG WITH HIS VIRAL LOAD IT IS 21 AS OPPOSED TO <20. This is what is called a blip and you cant get much lower than 1 copy above the limit of detection. I already told him he can change back to Atripla if it is because of all o fthe side effects he apparently is surpisingly expoeriencing but his ODEFSEY is Certainly WORKING just fine based on the labs

## 2015-10-13 ENCOUNTER — Other Ambulatory Visit: Payer: Self-pay

## 2015-10-13 DIAGNOSIS — B20 Human immunodeficiency virus [HIV] disease: Secondary | ICD-10-CM

## 2015-10-13 MED ORDER — EFAVIRENZ-EMTRICITAB-TENOFOVIR 600-200-300 MG PO TABS: 1.0000 | ORAL_TABLET | Freq: Every day | ORAL | Status: DC

## 2015-10-13 NOTE — Telephone Encounter (Signed)
Atripla will need to be sent to [facility]  [facility] . Patient has set up account .  Phone number : [phone]. This pharmacy will only refill Atripla .  I will send medication to pharmacy for delivery.   Medication sent.  Laverle Patter, RN

## 2015-10-14 ENCOUNTER — Encounter: Payer: Self-pay | Admitting: Family Medicine

## 2015-10-14 ENCOUNTER — Ambulatory Visit (INDEPENDENT_AMBULATORY_CARE_PROVIDER_SITE_OTHER): Payer: 59 | Admitting: Family Medicine

## 2015-10-14 VITALS — BP 106/66 | HR 51 | Temp 98.3°F | Resp 16 | Ht 70.0 in | Wt 159.0 lb

## 2015-10-14 DIAGNOSIS — M7582 Other shoulder lesions, left shoulder: Secondary | ICD-10-CM

## 2015-10-14 DIAGNOSIS — R29898 Other symptoms and signs involving the musculoskeletal system: Secondary | ICD-10-CM | POA: Diagnosis not present

## 2015-10-14 LAB — CK: CK TOTAL: 109 U/L (ref 7–232)

## 2015-10-14 NOTE — Progress Notes (Signed)
OFFICE VISIT  10/14/2015   CC:  Chief Complaint  Patient presents with  . Shoulder Pain    Left x 2-3 months, no knonw injury   HPI:    Patient is a 64 y.o. Caucasian male who presents for left shoulder pain over back, side, top and started about 3 mo ago. Hurts worse with reaching out and back and also reaching up.  No signif pain at rest.  Painful to lie on top of it when tries to sleep but not impairing sleep.  No trauma or strain recalled.  No tingling, numbness, or weakness down arm.  No neck pain.    Also c/o increased LE weakness after his running exercise last couple months or so, seemed to coincide with a switch in his HIV regimen, which also seems to have caused some dizziness and a recent slight up-tick in his viral load.  Therefore, he is set to see his ID doc in a couple days in order to discuss switching back to his previous HIV regimen which he was happy with and felt good on.  No actual muscle pain or joint aches or swelling, just feeling of leg fatigue after running, takes him a lot longer to regain strength for his next workout.  Past Medical History  Diagnosis Date  . Allergy     Cat allergy  . Coronary atherosclerosis of unspecified type of vessel, native or graft     CABG 07/2003; annual cardiology f/u (Dr. Pernell Dupre)  . Myocardial infarction ([place]) 2004  . HIV infection ([place]) Dx 02/2002    ID clinic w/ [facility].  . Hyperlipidemia   . Substance abuse     hx of  . Right inguinal hernia     s/p repair 03/2013  . 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 Asthma     Past Surgical History  Procedure Laterality Date  . Coronary artery bypass graft  2004  . Hernia repair      as infant  . Colonoscopy  01/2011    Normal (recall 10 yrs)  . Tonsillectomy    . Inguinal hernia repair Right 04/09/2013    Procedure: RIGHT HERNIA REPAIR INGUINAL ADULT;  Surgeon: Haywood Lasso, MD;  Location: [place];  Service: General;  Laterality: Right;     Outpatient Prescriptions Prior to Visit  Medication Sig Dispense Refill  . albuterol (VENTOLIN HFA) 108 (90 BASE) MCG/ACT inhaler Inhale 2 puffs into the lungs every 6 (six) hours as needed for wheezing or shortness of breath. 1 Inhaler 1  . aspirin 81 MG chewable tablet Chew 81 mg by mouth daily.      Marland Kitchen atorvastatin (LIPITOR) 40 MG tablet Take 1 tablet (40 mg total) by mouth daily. 90 tablet 3  . efavirenz-emtricitabine-tenofovir (ATRIPLA) 600-200-300 MG tablet Take 1 tablet by mouth at bedtime. 90 tablet 1  . lisinopril (PRINIVIL,ZESTRIL) 5 MG tablet TAKE 1 TABLET BY MOUTH EVERY DAY 90 tablet 1   No facility-administered medications prior to visit.    No Known Allergies  ROS As per HPI  PE: Blood pressure 106/66, pulse 51, temperature 98.3 F (36.8 C), temperature source Oral, resp. rate 16, height 5\' 10"  (1.778 m), weight 159 lb (72.122 kg), SpO2 97 %. Gen: Alert, well appearing.  Patient is oriented to person, place, time, and situation. L shoulder with mild TTP around hook of acromion.  No TTP of muscles of neck, traps area, or scapula.  No AC joint tenderness.  ABduction, IR, and ER exacerbate his pain on testing.  Neg drop sign testing. UE strength 5/5 prox/dist bilat.    LABS:  None today  IMPRESSION AND PLAN:  1) Left RC tendonitis; discussed treatment options and pt wants to "just live with it" for now, watchful waiting approach taken.  He does not even want to take prn tylenol or NSAID.  Next step could be steroid  Injection in shoulder so I made patient aware of this option.  I do not think any imaging is warranted today.  2) LE generalized fatigue, post-exercise especially: no actual pain.  I wonder if his HIV med is interacting with his statin to cause a rise in statin blood concentration, leading to his current sx's.   Will check CPK total today.   It sounds like he is going to switch back to previous HIV med regimen, and hopefully not long after this he'll feel  his leg weakness get better gradually.  Spent 25 min with pt today, with >50% of this time spent in counseling and care coordination regarding the above problems.  An After Visit Summary was printed and given to the patient.  FOLLOW UP: Return if symptoms worsen or fail to improve.

## 2015-10-14 NOTE — Progress Notes (Signed)
Pre visit review using our clinic review tool, if applicable. No additional management support is needed unless otherwise documented below in the visit note. 

## 2015-10-20 ENCOUNTER — Ambulatory Visit (INDEPENDENT_AMBULATORY_CARE_PROVIDER_SITE_OTHER): Payer: 59 | Admitting: Infectious Disease

## 2015-10-20 ENCOUNTER — Encounter: Payer: Self-pay | Admitting: Infectious Disease

## 2015-10-20 VITALS — BP 128/82 | HR 60 | Temp 97.7°F | Ht 70.0 in | Wt 157.0 lb

## 2015-10-20 DIAGNOSIS — B20 Human immunodeficiency virus [HIV] disease: Secondary | ICD-10-CM

## 2015-10-20 DIAGNOSIS — E785 Hyperlipidemia, unspecified: Secondary | ICD-10-CM | POA: Diagnosis not present

## 2015-10-20 DIAGNOSIS — I25700 Atherosclerosis of coronary artery bypass graft(s), unspecified, with unstable angina pectoris: Secondary | ICD-10-CM

## 2015-10-20 DIAGNOSIS — Z21 Asymptomatic human immunodeficiency virus [HIV] infection status: Secondary | ICD-10-CM

## 2015-10-20 NOTE — Progress Notes (Signed)
Subjective:    Patient ID: Edwin Padilla, male    DOB: 02-10-1951, 64 y.o.   MRN: [id number]  HPI   ANDREWJAMES Padilla is a 64 y.o. male who is doing superbly well on his  antiviral regimen, on atripla with undetectable viral load and health cd4 count. He is in primary care with [facility].   We had another extensive discussion with regards to changing to a more modern single tablet regimen that would be more safe to his bones and kidneys and avoid the psycho active properties of Sustiva. However after making this switch to [facility] he experienced a multitude of symptoms that I have never personally witnessed to [facility]. He had VL checked with ODEFSEY VL 21 and he was extrremely concerned about that which I told him was the smallest blip possible. Since making the swtich back he feels completely better.  Past Medical History  Diagnosis Date  . Allergy     Cat allergy  . Coronary atherosclerosis of unspecified type of vessel, native or graft     CABG 07/2003; annual cardiology f/u (Dr. Pernell Dupre)  . Myocardial infarction ([place]) 2004  . HIV infection ([place]) Dx 02/2002    ID clinic w/ [facility].  . Hyperlipidemia   . Substance abuse     hx of  . Right inguinal hernia     s/p repair 03/2013  . 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 Asthma     Past Surgical History  Procedure Laterality Date  . Coronary artery bypass graft  2004  . Hernia repair      as infant  . Colonoscopy  01/2011    Normal (recall 10 yrs)  . Tonsillectomy    . Inguinal hernia repair Right 04/09/2013    Procedure: RIGHT HERNIA REPAIR INGUINAL ADULT;  Surgeon: Haywood Lasso, MD;  Location: [place];  Service: General;  Laterality: Right;    Family History  Problem Relation Age of Onset  . Stroke Mother 47  . Heart failure Father   . Heart disease Father   . Colon cancer Father 63  . Cancer Brother 50    lymphoma      Social History   Social History  . Marital Status: Single    Spouse Name:  N/A  . Number of Children: N/A  . Years of Education: N/A   Social History Main Topics  . Smoking status: Former Smoker -- 35 years    Types: Cigarettes    Quit date: 12/12/2003  . Smokeless tobacco: Never Used  . Alcohol Use: No     Comment: 2005  . Drug Use: No     Comment: past history of crystal meth addiction -clean since 2005   . Sexual Activity: Not Currently     Comment: 2005   Other Topics Concern  . None   Social History Narrative   Homosexual.   Never married.   Hx of methamph abuse; no IV drug use.  [facility] remission drug program, no use since 2005.   Exercises regularly, says he eats a good diet.   Tob: quit 2004, 50 pack-yr hx.   No alcohol.    No Known Allergies   Current outpatient prescriptions:  .  albuterol (VENTOLIN HFA) 108 (90 BASE) MCG/ACT inhaler, Inhale 2 puffs into the lungs every 6 (six) hours as needed for wheezing or shortness of breath., Disp: 1 Inhaler, Rfl: 1 .  aspirin 81 MG chewable tablet, Chew 81 mg by  mouth daily.  , Disp: , Rfl:  .  atorvastatin (LIPITOR) 40 MG tablet, Take 1 tablet (40 mg total) by mouth daily., Disp: 90 tablet, Rfl: 3 .  efavirenz-emtricitabine-tenofovir (ATRIPLA) 600-200-300 MG tablet, Take 1 tablet by mouth at bedtime., Disp: 90 tablet, Rfl: 1 .  lisinopril (PRINIVIL,ZESTRIL) 5 MG tablet, TAKE 1 TABLET BY MOUTH EVERY DAY, Disp: 90 tablet, Rfl: 1   Review of Systems  Constitutional: Negative for fever, chills, diaphoresis, activity change, appetite change, fatigue and unexpected weight change.  HENT: Negative for sinus pressure and trouble swallowing.   Eyes: Negative for photophobia and visual disturbance.  Respiratory: Negative for chest tightness, shortness of breath and stridor.   Cardiovascular: Negative for palpitations and leg swelling.  Gastrointestinal: Negative for constipation, blood in stool, abdominal distention and anal bleeding.  Genitourinary: Negative for hematuria, flank pain and difficulty  urinating.  Musculoskeletal: Negative for myalgias, back pain, joint swelling, arthralgias and gait problem.  Skin: Negative for color change, pallor and wound.  Neurological: Negative for dizziness, tremors and light-headedness.  Hematological: Negative for adenopathy. Does not bruise/bleed easily.  Psychiatric/Behavioral: Negative for behavioral problems, confusion, dysphoric mood, decreased concentration and agitation.       Objective:   Physical Exam  Constitutional: He is oriented to person, place, and time. He appears well-developed and well-nourished. No distress.  HENT:  Head: Normocephalic and atraumatic.  Eyes: Conjunctivae and EOM are normal. No scleral icterus.  Neck: Normal range of motion. Neck supple. No JVD present.  Cardiovascular: Normal rate, regular rhythm and normal heart sounds.   Pulmonary/Chest: Effort normal and breath sounds normal. No respiratory distress.  Abdominal: He exhibits no distension.  Musculoskeletal: He exhibits no edema or tenderness.  Lymphadenopathy:    He has no cervical adenopathy.  Neurological: He is alert and oriented to person, place, and time. He has normal reflexes. He exhibits normal muscle tone. Coordination normal.  Skin: Skin is warm and dry. He is not diaphoretic. No erythema. No pallor.  Psychiatric: He has a normal mood and affect. His behavior is normal. Judgment and thought content normal.          Assessment & Plan:   HIV: Switched back to ATRIPLA, and RTC in one year for followup with labs   VBT:[name] 40mg , he is on asa and ACEI  HTN : well controlled  Need for flu vaccines: got flu vax at work

## 2015-11-01 ENCOUNTER — Other Ambulatory Visit: Payer: Self-pay | Admitting: *Deleted

## 2015-11-01 DIAGNOSIS — E785 Hyperlipidemia, unspecified: Secondary | ICD-10-CM

## 2015-11-01 MED ORDER — ATORVASTATIN CALCIUM 40 MG PO TABS: 40.0000 mg | ORAL_TABLET | Freq: Every day | ORAL | Status: DC

## 2015-11-01 MED ORDER — LISINOPRIL 5 MG PO TABS: 5.0000 mg | ORAL_TABLET | Freq: Every day | ORAL | Status: DC

## 2015-11-01 NOTE — Telephone Encounter (Signed)
Pt LMOM on 11/01/15 at 10:32am requesting a call back in regards to refills he needed.  Spoke to pt and he stated that his insurance has changed and he now has to get two of his medications filled at a local pharmacy. He has requested [facility]. He stated that he needs a 90 day supply of lisinopril and atorvastatin sent to [facility].   LOV: 03/15/15 NOV: 03/15/16  RF request for lisinopril Last written: 08/18/15 #90 w/ 1RF  RF request for atorvastatin Last written: 05/19/15 #90 w/ 3RF

## 2015-11-18 ENCOUNTER — Other Ambulatory Visit: Payer: Self-pay | Admitting: *Deleted

## 2015-11-18 DIAGNOSIS — E785 Hyperlipidemia, unspecified: Secondary | ICD-10-CM

## 2015-11-18 MED ORDER — LISINOPRIL 5 MG PO TABS: 5.0000 mg | ORAL_TABLET | Freq: Every day | ORAL | Status: DC

## 2015-11-18 MED ORDER — ATORVASTATIN CALCIUM 40 MG PO TABS: 40.0000 mg | ORAL_TABLET | Freq: Every day | ORAL | Status: DC

## 2015-11-18 NOTE — Telephone Encounter (Signed)
Request from [facility] for 90 day supply.   LOV: 03/15/15 NOV: 03/15/16  RF request for lisinopril Last written: 11/01/15 #90 w/ 3RF (local pharmacy)  RF request for atorvastatin Last written: 11/01/15 #90 w/ 3RF (local pharmacy)

## 2016-02-21 ENCOUNTER — Encounter: Payer: Self-pay | Admitting: Interventional Cardiology

## 2016-02-21 ENCOUNTER — Ambulatory Visit (INDEPENDENT_AMBULATORY_CARE_PROVIDER_SITE_OTHER): Payer: 59 | Admitting: Interventional Cardiology

## 2016-02-21 VITALS — BP 114/60 | HR 60 | Ht 70.0 in | Wt 161.6 lb

## 2016-02-21 DIAGNOSIS — E785 Hyperlipidemia, unspecified: Secondary | ICD-10-CM | POA: Diagnosis not present

## 2016-02-21 DIAGNOSIS — I25700 Atherosclerosis of coronary artery bypass graft(s), unspecified, with unstable angina pectoris: Secondary | ICD-10-CM | POA: Diagnosis not present

## 2016-02-21 NOTE — Progress Notes (Signed)
Cardiology Office Note   Date:  02/21/2016   ID:  Edwin Padilla, DOB 10/18/1951, MRN [id number]  PCP:  Tammi Sou, MD  Cardiologist:  Sinclair Grooms, MD   Chief Complaint  Patient presents with  . Coronary Artery Disease      History of Present Illness: Edwin Padilla is a 65 y.o. male who presents for CAD, CABG with LIMA to LAD and SVG to diagonal 1, hyperlipidemia, untreated HIV infection.  Edwin Padilla is doing well. His initial presentation in 2004 was with non-ST elevation myocardial infarction. Since that time he has undergone significant modification lifestyle including cessation of smoking, lipid management, absent of some drug use, and aerobic exercise. He denies anginal quality chest discomfort. He is very active. He exercises regularly. He has not had chest discomfort consistent with angina.    Past Medical History  Diagnosis Date  . Allergy     Cat allergy  . Coronary atherosclerosis of unspecified type of vessel, native or graft     CABG 07/2003; annual cardiology f/u (Dr. Pernell Dupre)  . Myocardial infarction ([place]) 2004  . HIV infection ([place]) Dx 02/2002    ID clinic w/ [facility].  . Hyperlipidemia   . Substance abuse     hx of  . Right inguinal hernia     s/p repair 03/2013  . 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 Asthma     Past Surgical History  Procedure Laterality Date  . Coronary artery bypass graft  2004  . Hernia repair      as infant  . Colonoscopy  01/2011    Normal (recall 10 yrs)  . Tonsillectomy    . Inguinal hernia repair Right 04/09/2013    Procedure: RIGHT HERNIA REPAIR INGUINAL ADULT;  Surgeon: Haywood Lasso, MD;  Location: [facility];  Service: General;  Laterality: Right;     Current Outpatient Prescriptions  Medication Sig Dispense Refill  . albuterol (VENTOLIN HFA) 108 (90 BASE) MCG/ACT inhaler Inhale 2 puffs into the lungs every 6 (six) hours as needed for wheezing or shortness of breath. 1 Inhaler 1  .  aspirin 81 MG chewable tablet Chew 81 mg by mouth daily.      Marland Kitchen atorvastatin (LIPITOR) 40 MG tablet Take 1 tablet (40 mg total) by mouth daily. 90 tablet 3  . efavirenz-emtricitabine-tenofovir (ATRIPLA) 600-200-300 MG tablet Take 1 tablet by mouth at bedtime. 90 tablet 1  . lisinopril (PRINIVIL,ZESTRIL) 5 MG tablet Take 1 tablet (5 mg total) by mouth daily. 90 tablet 3   No current facility-administered medications for this visit.    Allergies:   Review of patient's allergies indicates no known allergies.    Social History:  The patient  reports that he quit smoking about 12 years ago. His smoking use included Cigarettes. He quit after 35 years of use. He has never used smokeless tobacco. He reports that he does not drink alcohol or use illicit drugs.   Family History:  The patient's family history includes Cancer (age of onset: 46) in his brother; Colon cancer (age of onset: 4) in his father; Heart disease in his father; Heart failure in his father; Stroke (age of onset: 51) in his mother.    ROS:  Please see the history of present illness.   Otherwise, review of systems are positive for none.   All other systems are reviewed and negative.    PHYSICAL EXAM: VS:  There were no vitals taken  for this visit. , BMI There is no weight on file to calculate BMI. GEN: Well nourished, well developed, in no acute distress HEENT: normal Neck: no JVD, carotid bruits, or masses Cardiac: RRR.  There is no murmur, rub, or gallop. There is no edema. Respiratory:  clear to auscultation bilaterally, normal work of breathing. GI: soft, nontender, nondistended, + BS MS: no deformity or atrophy Skin: warm and dry, no rash Neuro:  Strength and sensation are intact Psych: euthymic mood, full affect   EKG:  EKG is ordered today. The ekg reveals sinus bradycardia at 58 bpm and otherwise normal.   Recent Labs: 10/05/2015: ALT 34; BUN 23; Creat 1.02; Hemoglobin 13.2; Platelets 303; Potassium 4.8; Sodium  140    Lipid Panel    Component Value Date/Time   CHOL 141 07/28/2015 0932   TRIG 73 07/28/2015 0932   HDL 52 07/28/2015 0932   CHOLHDL 2.7 07/28/2015 0932   VLDL 15 07/28/2015 0932   LDLCALC 74 07/28/2015 0932      Wt Readings from Last 3 Encounters:  10/20/15 157 lb (71.215 kg)  10/14/15 159 lb (72.122 kg)  08/11/15 157 lb (71.215 kg)      Other studies Reviewed: Additional studies/ records that were reviewed today include: Pocket diagram of coronary anatomy. The findings include SVG to diagonal 1 and LIMA to LAD. Also reviewed electronic chart data which reveals an LDL of 74 total cholesterol of 141 in August 2016..    ASSESSMENT AND PLAN:  1. Coronary artery disease involving coronary bypass graft of native heart with unstable angina pectoris (HCC) Asymptomatic - EKG 12-Lead - Exercise Tolerance Test; Future  2. Hyperlipidemia At target    Current medicines are reviewed at length with the patient today.  The patient has the following concerns regarding medicines: Questions whether he should be continued on statin therapy. We discussed the impact that it has on her aggression and long-term prognosis. Also made aware that target numbers would not be possible off therapy.  The following changes/actions have been instituted:    Continue current exercise protocol  Continue current medical regimen is listed  Excise treadmill test this year  Labs/ tests ordered today include:  No orders of the defined types were placed in this encounter.    Significant conversation around the pros and cons of statin therapy. Greater than 25 minutes spent during this office visit with greater than 50% in counseling concerning medical management and prevention.  Disposition:   FU with HS in 1 year  Signed, Sinclair Grooms, MD  02/21/2016 8:02 AM    [facility] [place] [address], [place], [place]  [address] Phone: [phone]; Fax: [phone]

## 2016-02-21 NOTE — Patient Instructions (Signed)
Medication Instructions:  Your physician recommends that you continue on your current medications as directed. Please refer to the Current Medication list given to you today.   Labwork: None ordered  Testing/Procedures: Your physician has requested that you have an exercise tolerance test. For further information please visit [email or web address]. Please also follow instruction sheet, as given.    Follow-Up: Your physician wants you to follow-up in: 1 year with Dr.Smith You will receive a reminder letter in the mail two months in advance. If you don't receive a letter, please call our office to schedule the follow-up appointment.   Any Other Special Instructions Will Be Listed Below (If Applicable).     If you need a refill on your cardiac medications before your next appointment, please call your pharmacy.

## 2016-03-15 ENCOUNTER — Encounter: Payer: Self-pay | Admitting: Family Medicine

## 2016-03-15 ENCOUNTER — Ambulatory Visit (INDEPENDENT_AMBULATORY_CARE_PROVIDER_SITE_OTHER): Payer: 59 | Admitting: Family Medicine

## 2016-03-15 VITALS — BP 113/75 | HR 59 | Temp 98.0°F | Resp 16 | Ht 69.0 in | Wt 156.5 lb

## 2016-03-15 DIAGNOSIS — Z0000 Encounter for general adult medical examination without abnormal findings: Secondary | ICD-10-CM | POA: Diagnosis not present

## 2016-03-15 DIAGNOSIS — Z125 Encounter for screening for malignant neoplasm of prostate: Secondary | ICD-10-CM | POA: Diagnosis not present

## 2016-03-15 LAB — PSA: PSA: 0.46 ng/mL (ref 0.10–4.00)

## 2016-03-15 NOTE — Progress Notes (Signed)
Pre visit review using our clinic review tool, if applicable. No additional management support is needed unless otherwise documented below in the visit note. 

## 2016-03-15 NOTE — Progress Notes (Signed)
Office Note 03/15/2016  CC:  Chief Complaint  Patient presents with  . Annual Exam    Pt is not fasting.     HPI:  Edwin Padilla is a 65 y.o. White male who is here for annual health maintenance exam. Follows with Dr. Tommy Medal for HIV, most recent f/u visit 10/2015, at which time he was doing well on current antiviral regimen, with undetectable viral load, good CD4 count.  Pt states he has plans for ID clinic f/u 04/2016.  He'll have labs repeated there so he won't need them here today.   He is a former smoker. Eye exam UTD. Good exercise habits and diet.   Past Medical History  Diagnosis Date  . Allergy     Cat allergy  . Coronary atherosclerosis of unspecified type of vessel, native or graft     CABG 07/2003; annual cardiology f/u (Dr. Pernell Dupre)  . Myocardial infarction ([place]) 2004  . HIV infection ([place]) Dx 02/2002    ID clinic w/ [facility].  . Hyperlipidemia   . Substance abuse     hx of  . Right inguinal hernia     s/p repair 03/2013  . 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 Asthma     Past Surgical History  Procedure Laterality Date  . Coronary artery bypass graft  2004  . Hernia repair      as infant  . Colonoscopy  01/2011    Normal (recall 10 yrs)  . Tonsillectomy    . Inguinal hernia repair Right 04/09/2013    Procedure: RIGHT HERNIA REPAIR INGUINAL ADULT;  Surgeon: Haywood Lasso, MD;  Location: [facility];  Service: General;  Laterality: Right;    Family History  Problem Relation Age of Onset  . Stroke Mother 69  . Heart failure Father   . Heart disease Father   . Colon cancer Father 27  . Cancer Brother 66    lymphoma    Social History   Social History  . Marital Status: Single    Spouse Name: N/A  . Number of Children: N/A  . Years of Education: N/A   Occupational History  . Not on file.   Social History Main Topics  . Smoking status: Former Smoker -- 35 years    Types: Cigarettes    Quit date: 12/12/2003  .  Smokeless tobacco: Never Used  . Alcohol Use: No     Comment: 2005  . Drug Use: No     Comment: past history of crystal meth addiction -clean since 2005   . Sexual Activity: Not Currently     Comment: 2005   Other Topics Concern  . Not on file   Social History Narrative   Homosexual.   Never married.   Hx of methamph abuse; no IV drug use.  [place] remission drug program, no use since 2005.   Exercises regularly, says he eats a good diet.   Tob: quit 2004, 50 pack-yr hx.   No alcohol.    Outpatient Prescriptions Prior to Visit  Medication Sig Dispense Refill  . albuterol (VENTOLIN HFA) 108 (90 BASE) MCG/ACT inhaler Inhale 2 puffs into the lungs every 6 (six) hours as needed for wheezing or shortness of breath. 1 Inhaler 1  . aspirin 81 MG chewable tablet Chew 81 mg by mouth daily.      Marland Kitchen atorvastatin (LIPITOR) 40 MG tablet Take 1 tablet (40 mg total) by mouth daily. 90 tablet 3  .  efavirenz-emtricitabine-tenofovir (ATRIPLA) 600-200-300 MG tablet Take 1 tablet by mouth at bedtime. 90 tablet 1  . lisinopril (PRINIVIL,ZESTRIL) 5 MG tablet Take 1 tablet (5 mg total) by mouth daily. 90 tablet 3   No facility-administered medications prior to visit.    No Known Allergies  ROS Review of Systems  Constitutional: Negative for fever, chills, appetite change and fatigue.  HENT: Negative for congestion, dental problem, ear pain and sore throat.   Eyes: Negative for discharge, redness and visual disturbance.  Respiratory: Negative for cough, chest tightness, shortness of breath and wheezing.   Cardiovascular: Negative for chest pain, palpitations and leg swelling.  Gastrointestinal: Negative for nausea, vomiting, abdominal pain, diarrhea and blood in stool.  Genitourinary: Negative for dysuria, urgency, frequency, hematuria, flank pain and difficulty urinating.  Musculoskeletal: Negative for myalgias, back pain, joint swelling, arthralgias and neck stiffness.  Skin: Negative for pallor  and rash.  Neurological: Negative for dizziness, speech difficulty, weakness and headaches.  Hematological: Negative for adenopathy. Does not bruise/bleed easily.  Psychiatric/Behavioral: Negative for confusion and sleep disturbance. The patient is not nervous/anxious.     PE; Blood pressure 113/75, pulse 59, temperature 98 F (36.7 C), temperature source Oral, resp. rate 16, height 5\' 9"  (1.753 m), weight 156 lb 8 oz (70.988 kg), SpO2 93 %. Gen: Alert, well appearing.  Patient is oriented to person, place, time, and situation. AFFECT: pleasant, lucid thought and speech. ENT: Ears: EACs clear, normal epithelium.  TMs with good light reflex and landmarks bilaterally.  Eyes: no injection, icteris, swelling, or exudate.  EOMI, PERRLA. Nose: no drainage or turbinate edema/swelling.  No injection or focal lesion.  Mouth: lips without lesion/swelling.  Oral mucosa pink and moist.  Dentition intact and without obvious caries or gingival swelling.  Oropharynx without erythema, exudate, or swelling.  Neck: supple/nontender.  No LAD, mass, or TM.  Carotid pulses 2+ bilaterally, without bruits. CV: RRR, no m/r/g.   LUNGS: CTA bilat, nonlabored resps, good aeration in all lung fields. ABD: soft, NT, ND, BS normal.  No hepatospenomegaly or mass.  No bruits. EXT: no clubbing, cyanosis, or edema.  Musculoskeletal: no joint swelling, erythema, warmth, or tenderness.  ROM of all joints intact. Skin - no sores or suspicious lesions or rashes or color changes Rectal exam: negative without mass, lesions or tenderness, PROSTATE EXAM: smooth and symmetric without nodules or tenderness.  Pertinent labs:   Lab Results  Component Value Date   WBC 7.0 10/05/2015   HGB 13.2 10/05/2015   HCT 38.2* 10/05/2015   MCV 89.9 10/05/2015   PLT 303 10/05/2015   Lab Results  Component Value Date   CREATININE 1.02 10/05/2015   BUN 23 10/05/2015   NA 140 10/05/2015   K 4.8 10/05/2015   CL 104 10/05/2015   CO2 27  10/05/2015   Lab Results  Component Value Date   ALT 34 10/05/2015   AST 20 10/05/2015   ALKPHOS 62 10/05/2015   BILITOT 0.7 10/05/2015   Lab Results  Component Value Date   CHOL 141 07/28/2015   Lab Results  Component Value Date   HDL 52 07/28/2015   Lab Results  Component Value Date   LDLCALC 74 07/28/2015   Lab Results  Component Value Date   TRIG 73 07/28/2015   Lab Results  Component Value Date   CHOLHDL 2.7 07/28/2015    ASSESSMENT AND PLAN:   Health maintenance exam: Reviewed age and gender appropriate health maintenance issues (prudent diet, regular exercise, health risks of  tobacco and excessive alcohol, use of seatbelts, fire alarms in home, use of sunscreen).  Also reviewed age and gender appropriate health screening as well as vaccine recommendations. Vaccines UTD. Prostate ca screening: DRE normal, PSA drawn. Colon ca screening: next colonoscopy due after 01/2021. No labs today: he says he will be getting these 04/2016 at his next ID clinic f/u.  An After Visit Summary was printed and given to the patient.  FOLLOW UP:  Return in about 1 year (around 03/15/2017) for annual CPE (fasting).  Signed:  Crissie Sickles, MD           03/15/2016

## 2016-03-15 NOTE — Addendum Note (Signed)
Addended by: Onalee Hua on: 03/15/2016 09:45 AM   Modules accepted: Miquel Dunn

## 2016-03-16 ENCOUNTER — Other Ambulatory Visit: Payer: Self-pay | Admitting: *Deleted

## 2016-03-16 DIAGNOSIS — B20 Human immunodeficiency virus [HIV] disease: Secondary | ICD-10-CM

## 2016-03-16 MED ORDER — EFAVIRENZ-EMTRICITAB-TENOFOVIR 600-200-300 MG PO TABS: 1.0000 | ORAL_TABLET | Freq: Every day | ORAL | Status: DC

## 2016-04-19 ENCOUNTER — Other Ambulatory Visit: Payer: 59

## 2016-04-19 DIAGNOSIS — B20 Human immunodeficiency virus [HIV] disease: Secondary | ICD-10-CM

## 2016-04-19 LAB — COMPLETE METABOLIC PANEL WITH GFR
ALT: 43 U/L (ref 9–46)
AST: 24 U/L (ref 10–35)
Albumin: 4.3 g/dL (ref 3.6–5.1)
Alkaline Phosphatase: 79 U/L (ref 40–115)
BUN: 20 mg/dL (ref 7–25)
CHLORIDE: 102 mmol/L (ref 98–110)
CO2: 26 mmol/L (ref 20–31)
CREATININE: 1.12 mg/dL (ref 0.70–1.25)
Calcium: 9.2 mg/dL (ref 8.6–10.3)
GFR, Est African American: 80 mL/min (ref >=60)
GFR, Est Non African American: 69 mL/min (ref >=60)
GLUCOSE: 95 mg/dL (ref 65–99)
Potassium: 4.7 mmol/L (ref 3.5–5.3)
SODIUM: 138 mmol/L (ref 135–146)
TOTAL PROTEIN: 6.8 g/dL (ref 6.1–8.1)
Total Bilirubin: 0.3 mg/dL (ref 0.2–1.2)

## 2016-04-19 LAB — CBC WITH DIFFERENTIAL/PLATELET
BASOS PCT: 0 %
Basophils Absolute: 0 cells/uL (ref 0–200)
EOS PCT: 3 %
Eosinophils Absolute: 255 cells/uL (ref 15–500)
HCT: 41 % (ref 38.5–50.0)
Hemoglobin: 14.2 g/dL (ref 13.2–17.1)
Lymphocytes Relative: 20 %
Lymphs Abs: 1700 cells/uL (ref 850–3900)
MCH: 32 pg (ref 27.0–33.0)
MCHC: 34.6 g/dL (ref 32.0–36.0)
MCV: 92.3 fL (ref 80.0–100.0)
MONOS PCT: 7 %
MPV: 9.7 fL (ref 7.5–12.5)
Monocytes Absolute: 595 cells/uL (ref 200–950)
NEUTROS ABS: 5950 {cells}/uL (ref 1500–7800)
Neutrophils Relative %: 70 %
PLATELETS: 313 10*3/uL (ref 140–400)
RBC: 4.44 MIL/uL (ref 4.20–5.80)
RDW: 14.3 % (ref 11.0–15.0)
WBC: 8.5 10*3/uL (ref 3.8–10.8)

## 2016-04-19 LAB — LIPID PANEL
CHOL/HDL RATIO: 2.9 ratio (ref <=5.0)
Cholesterol: 169 mg/dL (ref 125–200)
HDL: 58 mg/dL (ref >=40)
LDL CALC: 94 mg/dL (ref <130)
Triglycerides: 83 mg/dL (ref <150)
VLDL: 17 mg/dL (ref <30)

## 2016-04-19 NOTE — Addendum Note (Signed)
Addended by: Dolan Amen D on: 04/19/2016 11:12 AM   Modules accepted: Orders

## 2016-04-20 LAB — HIV-1 RNA QUANT-NO REFLEX-BLD

## 2016-04-20 LAB — MICROALBUMIN / CREATININE URINE RATIO
Creatinine, Urine: 144 mg/dL (ref 20–370)
Microalb Creat Ratio: 3 mcg/mg creat (ref <30)
Microalb, Ur: 0.5 mg/dL

## 2016-04-20 LAB — RPR

## 2016-04-20 LAB — T-HELPER CELL (CD4) - (RCID CLINIC ONLY)
CD4 % Helper T Cell: 36 % (ref 33–55)
CD4 T CELL ABS: 700 /uL (ref 400–2700)

## 2016-04-20 LAB — URINE CYTOLOGY ANCILLARY ONLY
Chlamydia: NEGATIVE
NEISSERIA GONORRHEA: NEGATIVE

## 2016-05-03 ENCOUNTER — Encounter: Payer: Self-pay | Admitting: Internal Medicine

## 2016-05-03 ENCOUNTER — Ambulatory Visit (INDEPENDENT_AMBULATORY_CARE_PROVIDER_SITE_OTHER): Payer: 59 | Admitting: Internal Medicine

## 2016-05-03 VITALS — BP 114/76 | HR 54 | Temp 97.8°F | Wt 160.0 lb

## 2016-05-03 DIAGNOSIS — E785 Hyperlipidemia, unspecified: Secondary | ICD-10-CM | POA: Diagnosis not present

## 2016-05-03 DIAGNOSIS — B20 Human immunodeficiency virus [HIV] disease: Secondary | ICD-10-CM

## 2016-05-03 DIAGNOSIS — Z21 Asymptomatic human immunodeficiency virus [HIV] infection status: Secondary | ICD-10-CM | POA: Diagnosis not present

## 2016-05-03 NOTE — Assessment & Plan Note (Signed)
Doing great and hesistant to change.  Will continue with Atripla for now and consider change if he develops renal insufficieny or other issues.

## 2016-05-03 NOTE — Progress Notes (Signed)
CC: Follow up for HIV  Interval history: Currently is asymptomatic and well-controlled on Atripla.  Since last visit has had no new issues.  Has no associated weight loss, diarrhea.  Denies any missed doses.    Did try Vernell Leep previously and did not like it.  Went back to Atripla and continues to prefer that.   Also has CAD and is on ACEI, Statin and aspirin.  No really interested in changing off of Atripla at this time.   Prior to Admission medications   Medication Sig Start Date End Date Taking? Authorizing Provider  albuterol (VENTOLIN HFA) 108 (90 BASE) MCG/ACT inhaler Inhale 2 puffs into the lungs every 6 (six) hours as needed for wheezing or shortness of breath. 06/07/15  Yes Tammi Sou, MD  aspirin 81 MG chewable tablet Chew 81 mg by mouth daily.     Yes Historical Provider, MD  atorvastatin (LIPITOR) 40 MG tablet Take 1 tablet (40 mg total) by mouth daily. 11/18/15  Yes Tammi Sou, MD  efavirenz-emtricitabine-tenofovir (ATRIPLA) 600-200-300 MG tablet Take 1 tablet by mouth at bedtime. 03/16/16  Yes Truman Hayward, MD  lisinopril (PRINIVIL,ZESTRIL) 5 MG tablet Take 1 tablet (5 mg total) by mouth daily. 11/18/15  Yes Tammi Sou, MD    Review of Systems Constitutional: negative for fatigue Integument/breast: negative for rash Musculoskeletal: negative for myalgias and arthralgias All other systems reviewed and are negative   Physical Exam: CONSTITUTIONAL:in no apparent distress and alert  Filed Vitals:   05/03/16 1343  BP: 114/76  Pulse: 54  Temp: 97.8 F (36.6 C)   Eyes: anicteric HENT: no thrush, no cervical lymphadenopathy Respiratory: Normal respiratory effort; CTA B  Lab Results  Component Value Date   HIV1RNAQUANT <20 04/19/2016   HIV1RNAQUANT 21 10/05/2015   HIV1RNAQUANT <20 07/28/2015   No components found for: HIV1GENOTYPRPLUS No components found for: THELPERCELL

## 2016-05-03 NOTE — Assessment & Plan Note (Signed)
Continues on a statin.

## 2016-05-04 ENCOUNTER — Ambulatory Visit: Payer: 59 | Admitting: Internal Medicine

## 2016-05-11 HISTORY — PX: CARDIOVASCULAR STRESS TEST: SHX262

## 2016-05-15 ENCOUNTER — Ambulatory Visit (INDEPENDENT_AMBULATORY_CARE_PROVIDER_SITE_OTHER): Payer: 59 | Admitting: Family Medicine

## 2016-05-15 ENCOUNTER — Encounter: Payer: Self-pay | Admitting: Family Medicine

## 2016-05-15 VITALS — BP 106/69 | HR 67 | Temp 98.3°F | Resp 18 | Ht 69.0 in | Wt 158.2 lb

## 2016-05-15 DIAGNOSIS — H918X3 Other specified hearing loss, bilateral: Secondary | ICD-10-CM | POA: Diagnosis not present

## 2016-05-15 DIAGNOSIS — H6120 Impacted cerumen, unspecified ear: Secondary | ICD-10-CM | POA: Insufficient documentation

## 2016-05-15 DIAGNOSIS — H6123 Impacted cerumen, bilateral: Secondary | ICD-10-CM

## 2016-05-15 MED ORDER — CARBAMIDE PEROXIDE 6.5 % OT SOLN
OTIC | Status: DC
Start: 2016-05-15 — End: 2018-06-17

## 2016-05-15 NOTE — Progress Notes (Signed)
Patient ID: Edwin Padilla, male   DOB: 07-30-1951, 65 y.o.   MRN: [id number]    Edwin Padilla , 02-01-1951, 65 y.o., male MRN: [id number]  CC: Ear fullness Subjective: Pt presents for an acute OV with complaints of dar fullness of 1 week duration L>R.  He feels there is mild hearing/muffled sound in the left ear. Has intermittent ringing in the ears chronically, nothing worsening.  No Associated symptoms. Tried over the counter was removal and was not effective. No allergy medicine. He has had this a couple of times in the past and had lavage in the office.   No Known Allergies Social History  Substance Use Topics  . Smoking status: Former Smoker -- 35 years    Types: Cigarettes    Quit date: 12/12/2003  . Smokeless tobacco: Never Used  . Alcohol Use: No     Comment: 2005   Past Medical History  Diagnosis Date  . Allergy     Cat allergy  . Coronary atherosclerosis of unspecified type of vessel, native or graft     CABG 07/2003; annual cardiology f/u (Dr. Pernell Dupre)  . Myocardial infarction ([place]) 2004  . HIV infection ([place]) Dx 02/2002    ID clinic w/ [facility].  . Hyperlipidemia   . Substance abuse     hx of  . Right inguinal hernia     s/p repair 03/2013  . 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 Asthma    Past Surgical History  Procedure Laterality Date  . Coronary artery bypass graft  2004  . Hernia repair      as infant  . Colonoscopy  01/2011    Normal (recall 10 yrs)  . Tonsillectomy    . Inguinal hernia repair Right 04/09/2013    Procedure: RIGHT HERNIA REPAIR INGUINAL ADULT;  Surgeon: Haywood Lasso, MD;  Location: [place] [facility];  Service: General;  Laterality: Right;   Family History  Problem Relation Age of Onset  . Stroke Mother 70  . Heart failure Father   . Heart disease Father   . Colon cancer Father 61  . Cancer Brother 79    lymphoma     Medication List       This list is accurate as of: 05/15/16  9:24 AM.  Always use your most recent  med list.               albuterol 108 (90 Base) MCG/ACT inhaler  Commonly known as:  VENTOLIN HFA  Inhale 2 puffs into the lungs every 6 (six) hours as needed for wheezing or shortness of breath.     aspirin 81 MG chewable tablet  Chew 81 mg by mouth daily.     atorvastatin 40 MG tablet  Commonly known as:  LIPITOR  Take 1 tablet (40 mg total) by mouth daily.     efavirenz-emtricitabine-tenofovir 600-200-300 MG tablet  Commonly known as:  ATRIPLA  Take 1 tablet by mouth at bedtime.     lisinopril 5 MG tablet  Commonly known as:  PRINIVIL,ZESTRIL  Take 1 tablet (5 mg total) by mouth daily.        ROS: Negative, with the exception of above mentioned in HPI  Objective:  BP 106/69 mmHg  Pulse 67  Temp(Src) 98.3 F (36.8 C) (Oral)  Resp 18  Ht 5\' 9"  (1.753 m)  Wt 158 lb 4 oz (71.782 kg)  BMI 23.36 kg/m2  SpO2 96% Body mass index is 23.36  kg/(m^2). Gen: Afebrile. No acute distress. Nontoxic in appearance. Well developed , well nourished.  HENT: AT. [place]. Bilateral TM, unable to be visualized 2/2 cerumen impaction. MMM, no oral lesions. Bilateral nares without erythema or swelling. Throat without erythema or exudates.  Eyes:Pupils Equal Round Reactive to light, Extraocular movements intact,  Conjunctiva without redness, discharge or icterus. Neck/lymp/endocrine: Supple, No lymphadenopathy  Assessment/Plan: KALU SHAMI is a 65 y.o. male present for acute OV for  1. Hearing loss due to cerumen impaction, bilateral - Bilateral lavage with instrumentation today.  - debrox for future occurrence and monthly maintenance.  - carbamide peroxide (DEBROX) 6.5 % otic solution; 5 drops BID with cerumen impaction  Dispense: 15 mL; Refill: 2   > 25 minutes spent with patient, >50% of time spent face to face counseling patient and coordinating care.  electronically signed by:  Howard Pouch, DO  [place]

## 2016-05-15 NOTE — Patient Instructions (Signed)
I have called in debrox, which is a solution to use twice a day for a few days when you have symptoms of cerumen impaction. You could also attempt to use  A couple times a month to help keep ear wax build up to a minimum before impaction occurs.  Cerumen Impaction The structures of the external ear canal secrete a waxy substance known as cerumen. Excess cerumen can build up in the ear canal, causing a condition known as cerumen impaction. Cerumen impaction can cause ear pain and disrupt the function of the ear. The rate of cerumen production differs for each individual. In certain individuals, the configuration of the ear canal may decrease his or her ability to naturally remove cerumen. CAUSES Cerumen impaction is caused by excessive cerumen production or buildup. RISK FACTORS  Frequent use of swabs to clean ears.  Having narrow ear canals.  Having eczema.  Being dehydrated. SIGNS AND SYMPTOMS  Diminished hearing.  Ear drainage.  Ear pain.  Ear itch. TREATMENT Treatment may involve:  Over-the-counter or prescription ear drops to soften the cerumen.  Removal of cerumen by a health care provider. This may be done with:  Irrigation with warm water. This is the most common method of removal.  Ear curettes and other instruments.  Surgery. This may be done in severe cases. HOME CARE INSTRUCTIONS  Take medicines only as directed by your health care provider.  Do not insert objects into the ear with the intent of cleaning the ear. PREVENTION  Do not insert objects into the ear, even with the intent of cleaning the ear. Removing cerumen as a part of normal hygiene is not necessary, and the use of swabs in the ear canal is not recommended.  Drink enough water to keep your urine clear or pale yellow.  Control your eczema if you have it. SEEK MEDICAL CARE IF:  You develop ear pain.  You develop bleeding from the ear.  The cerumen does not clear after you use ear drops as  directed.   This information is not intended to replace advice given to you by your health care provider. Make sure you discuss any questions you have with your health care provider.   Document Released: 01/04/2005 Document Revised: 12/18/2014 Document Reviewed: 07/14/2015 Elsevier Interactive Patient Education [employer].

## 2016-05-30 ENCOUNTER — Ambulatory Visit (INDEPENDENT_AMBULATORY_CARE_PROVIDER_SITE_OTHER): Payer: 59

## 2016-05-30 DIAGNOSIS — I25700 Atherosclerosis of coronary artery bypass graft(s), unspecified, with unstable angina pectoris: Secondary | ICD-10-CM

## 2016-05-30 LAB — EXERCISE TOLERANCE TEST
CHL CUP MPHR: 102 {beats}/min
CHL CUP RESTING HR STRESS: 57 {beats}/min
CHL RATE OF PERCEIVED EXERTION: 17
CSEPEDS: 30 s
CSEPHR: 156 %
Estimated workload: 12.5 METS
Exercise duration (min): 10 min
Peak HR: 160 {beats}/min

## 2016-07-27 ENCOUNTER — Ambulatory Visit: Payer: 59 | Admitting: Family Medicine

## 2016-09-08 ENCOUNTER — Other Ambulatory Visit: Payer: Self-pay | Admitting: *Deleted

## 2016-09-08 DIAGNOSIS — B20 Human immunodeficiency virus [HIV] disease: Secondary | ICD-10-CM

## 2016-09-08 MED ORDER — EFAVIRENZ-EMTRICITAB-TENOFOVIR 600-200-300 MG PO TABS: 1.0000 | ORAL_TABLET | Freq: Every day | ORAL | 2 refills | Status: DC

## 2016-10-07 ENCOUNTER — Other Ambulatory Visit: Payer: Self-pay | Admitting: Family Medicine

## 2016-10-07 DIAGNOSIS — E785 Hyperlipidemia, unspecified: Secondary | ICD-10-CM

## 2016-10-23 ENCOUNTER — Other Ambulatory Visit: Payer: 59

## 2016-10-25 ENCOUNTER — Other Ambulatory Visit: Payer: 59

## 2016-10-25 DIAGNOSIS — B20 Human immunodeficiency virus [HIV] disease: Secondary | ICD-10-CM

## 2016-10-26 LAB — T-HELPER CELL (CD4) - (RCID CLINIC ONLY)
CD4 T CELL HELPER: 35 % (ref 33–55)
CD4 T Cell Abs: 770 /uL (ref 400–2700)

## 2016-10-27 LAB — HIV-1 RNA QUANT-NO REFLEX-BLD

## 2016-11-06 ENCOUNTER — Encounter: Payer: Self-pay | Admitting: Internal Medicine

## 2016-11-06 ENCOUNTER — Ambulatory Visit (INDEPENDENT_AMBULATORY_CARE_PROVIDER_SITE_OTHER): Payer: 59 | Admitting: Internal Medicine

## 2016-11-06 VITALS — BP 128/73 | HR 67 | Temp 98.6°F | Wt 163.0 lb

## 2016-11-06 DIAGNOSIS — E7800 Pure hypercholesterolemia, unspecified: Secondary | ICD-10-CM

## 2016-11-06 DIAGNOSIS — B20 Human immunodeficiency virus [HIV] disease: Secondary | ICD-10-CM | POA: Diagnosis not present

## 2016-11-06 DIAGNOSIS — Z113 Encounter for screening for infections with a predominantly sexual mode of transmission: Secondary | ICD-10-CM | POA: Diagnosis not present

## 2016-11-06 NOTE — Assessment & Plan Note (Signed)
Managed by his PCP but will check with his labs next visit.

## 2016-11-06 NOTE — Assessment & Plan Note (Signed)
Will screen next visit.  

## 2016-11-06 NOTE — Progress Notes (Signed)
CC: Follow up for HIV  Interval history: Currently is asymptomatic and well-controlled on Atripla.  Since last visit has had no new issues.  Has no associated weight loss, diarrhea.  Denies any missed doses.    Did try Vernell Leep previously and did not like it.  Went back to Atripla and continues to prefer that.   CD4 of 770, viral load remains undetectable.   Also has CAD and is on ACEI, Statin and aspirin.  No really interested in changing off of Atripla at this time.   Prior to Admission medications   Medication Sig Start Date End Date Taking? Authorizing Provider  albuterol (VENTOLIN HFA) 108 (90 BASE) MCG/ACT inhaler Inhale 2 puffs into the lungs every 6 (six) hours as needed for wheezing or shortness of breath. 06/07/15  Yes Tammi Sou, MD  aspirin 81 MG chewable tablet Chew 81 mg by mouth daily.     Yes Historical Provider, MD  atorvastatin (LIPITOR) 40 MG tablet Take 1 tablet (40 mg total) by mouth daily. 11/18/15  Yes Tammi Sou, MD  efavirenz-emtricitabine-tenofovir (ATRIPLA) 600-200-300 MG tablet Take 1 tablet by mouth at bedtime. 03/16/16  Yes Truman Hayward, MD  lisinopril (PRINIVIL,ZESTRIL) 5 MG tablet Take 1 tablet (5 mg total) by mouth daily. 11/18/15  Yes Tammi Sou, MD    Review of Systems Constitutional: negative for fatigue Integument/breast: negative for rash Musculoskeletal: negative for myalgias and arthralgias All other systems reviewed and are negative   Physical Exam: CONSTITUTIONAL:in no apparent distress and alert  There were no vitals filed for this visit. Eyes: anicteric HENT: no thrush, no cervical lymphadenopathy Respiratory: Normal respiratory effort; CTA B  Lab Results  Component Value Date   HIV1RNAQUANT <20 10/25/2016   HIV1RNAQUANT <20 04/19/2016   HIV1RNAQUANT 21 10/05/2015   No components found for: HIV1GENOTYPRPLUS No components found for: THELPERCELL

## 2016-11-06 NOTE — Assessment & Plan Note (Signed)
Doing great.  rtc 6 months.  

## 2017-02-20 ENCOUNTER — Encounter: Payer: Self-pay | Admitting: Family Medicine

## 2017-02-20 ENCOUNTER — Ambulatory Visit (INDEPENDENT_AMBULATORY_CARE_PROVIDER_SITE_OTHER): Payer: 59 | Admitting: Family Medicine

## 2017-02-20 VITALS — BP 119/79 | HR 55 | Temp 97.9°F | Resp 16 | Ht 69.0 in | Wt 162.2 lb

## 2017-02-20 DIAGNOSIS — E860 Dehydration: Secondary | ICD-10-CM | POA: Diagnosis not present

## 2017-02-20 DIAGNOSIS — Z23 Encounter for immunization: Secondary | ICD-10-CM | POA: Diagnosis not present

## 2017-02-20 DIAGNOSIS — R42 Dizziness and giddiness: Secondary | ICD-10-CM | POA: Diagnosis not present

## 2017-02-20 MED ORDER — ALBUTEROL SULFATE HFA 108 (90 BASE) MCG/ACT IN AERS: 2.0000 | INHALATION_SPRAY | Freq: Four times a day (QID) | RESPIRATORY_TRACT | 1 refills | Status: DC | PRN

## 2017-02-20 NOTE — Progress Notes (Signed)
OFFICE VISIT  02/20/2017   CC:  Chief Complaint  Patient presents with  . Dizziness    x 2 days   HPI:    Patient is a 66 y.o. Caucasian male who presents for "dizziness". Two nights ago he recalls being tired, had a distinct period of feeling mildly unsteady that lasted until mid day the next day--like his equilibrium was impaired.  No falls.  No palpitations.  No CP. It would subside if he lay down or sit down.  Sitting up triggered it, but not turning head or torso.   Then last night it was 90-95% gone.  Feels fine today but generally tired, head a bit foggy.  No vertigo. No nausea.  No change in hearing or ringing in ears. No recent med changes.   He is a runner, feels like he is chronically not hydrated enough. No recent fever or malaise or respiratory symptoms.  No recent HAs.  Past Medical History:  Diagnosis Date  . Allergy    Cat allergy  . Asthma   . Coronary atherosclerosis of unspecified type of vessel, native or graft    CABG 07/2003; annual cardiology f/u (Dr. Pernell Dupre)  . HIV infection ([place]) Dx 02/2002   ID clinic w/ [facility].  . Hyperlipidemia   . 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 Myocardial infarction 2004  . Right inguinal hernia    s/p repair 03/2013  . Substance abuse    hx of    Past Surgical History:  Procedure Laterality Date  . COLONOSCOPY  01/2011   Normal (recall 10 yrs)  . CORONARY ARTERY BYPASS GRAFT  2004  . HERNIA REPAIR     as infant  . INGUINAL HERNIA REPAIR Right 04/09/2013   Procedure: RIGHT HERNIA REPAIR INGUINAL ADULT;  Surgeon: Haywood Lasso, MD;  Location: [place];  Service: General;  Laterality: Right;  . TONSILLECTOMY      Outpatient Medications Prior to Visit  Medication Sig Dispense Refill  . aspirin 81 MG chewable tablet Chew 81 mg by mouth daily.      Marland Kitchen atorvastatin (LIPITOR) 40 MG tablet TAKE 1 TABLET BY MOUTH  DAILY 90 tablet 1  . carbamide peroxide (DEBROX) 6.5 % otic solution 5 drops BID  with cerumen impaction 15 mL 2  . efavirenz-emtricitabine-tenofovir (ATRIPLA) 600-200-300 MG tablet Take 1 tablet by mouth at bedtime. 90 tablet 2  . lisinopril (PRINIVIL,ZESTRIL) 5 MG tablet TAKE 1 TABLET BY MOUTH  DAILY 90 tablet 1  . albuterol (VENTOLIN HFA) 108 (90 BASE) MCG/ACT inhaler Inhale 2 puffs into the lungs every 6 (six) hours as needed for wheezing or shortness of breath. 1 Inhaler 1   No facility-administered medications prior to visit.     No Known Allergies  ROS As per HPI  PE: Blood pressure 119/79, pulse (!) 55, temperature 97.9 F (36.6 C), temperature source Oral, resp. rate 16, height 5\' 9"  (1.753 m), weight 162 lb 4 oz (73.6 kg), SpO2 99 %.  Orthostatics today: Supine: 113/74, P 45  Upright: 110/76, P 54  Standing: 118/83, P 70 Gen: Alert, well appearing.  Patient is oriented to person, place, time, and situation. AFFECT: pleasant, lucid thought and speech. ENT: Ears: EACs clear, normal epithelium.  TMs with good light reflex and landmarks bilaterally.  Eyes: no injection, icteris, swelling, or exudate.  EOMI, PERRLA. Nose: no drainage or turbinate edema/swelling.  No injection or focal lesion.  Mouth: lips without lesion/swelling.  Oral mucosa pink  and moist.  Dentition intact and without obvious caries or gingival swelling.  Oropharynx without erythema, exudate, or swelling.  Neck - No masses or thyromegaly or limitation in range of motion.  Carotids 2+ bilat, w/out bruit. CV: RRR, no m/r/g.   LUNGS: CTA bilat, nonlabored resps, good aeration in all lung fields. Neuro: CN 2-12 intact bilaterally, strength 5/5 in proximal and distal upper extremities and lower extremities bilaterally.  No tremor.  FNF normal bilat.  No ataxia.   No pronator drift.  LABS:  Lab Results  Component Value Date   WBC 8.5 04/19/2016   HGB 14.2 04/19/2016   HCT 41.0 04/19/2016   MCV 92.3 04/19/2016   PLT 313 04/19/2016     Chemistry      Component Value Date/Time   NA 138  04/19/2016 0905   K 4.7 04/19/2016 0905   CL 102 04/19/2016 0905   CO2 26 04/19/2016 0905   BUN 20 04/19/2016 0905   CREATININE 1.12 04/19/2016 0905      Component Value Date/Time   CALCIUM 9.2 04/19/2016 0905   ALKPHOS 79 04/19/2016 0905   AST 24 04/19/2016 0905   ALT 43 04/19/2016 0905   BILITOT 0.3 04/19/2016 0905     HIV 1 RNA quant: NOT DETECTED on 10/25/16. CD4 T cells abs: 770 on 10/25/16.  IMPRESSION AND PLAN:  1) Orthostatic dizziness; related to recent mild dehydration + ongoing antihypertensive use. Instructions: Do not take your lisinopril for the next 3 days. Increase your intake of clear fluids (at least 65 oz of water per day).  You could try a 50/50 mixture of gatorade and water, too.  He'll discuss ongoing use of lisinopril at next f/u with his cardiologist, which is later this week.  2) Preventative health care: pt is due for booster of pneumovax 23.  He had prevnar 13 "early" due to his immunocompromised state.  He is ok to get today's pneumovax 23 < 1 yr from the time of his prevnar 13 due to his immunocompromised state. Also, at next visit he does want to get the new zoster vaccine: Shingrix. He states he had zostavax 2-3 yrs ago after getting the ok from his ID MD.  An After Visit Summary was printed and given to the patient.  Spent 30 min with pt today, with >50% of this time spent in counseling and care coordination regarding the above problems.  FOLLOW UP: Return in about 2 months (around 04/22/2017) for annual CPE (fasting).  Signed:  Crissie Sickles, MD           02/20/2017

## 2017-02-20 NOTE — Progress Notes (Signed)
Pre visit review using our clinic review tool, if applicable. No additional management support is needed unless otherwise documented below in the visit note. 

## 2017-02-20 NOTE — Patient Instructions (Signed)
Do not take your lisinopril for the next 3 days. Increase your intake of clear fluids (at least 65 oz of water per day).  You could try a 50/50 mixture of gatorade and water, too.

## 2017-02-21 ENCOUNTER — Encounter: Payer: Self-pay | Admitting: Interventional Cardiology

## 2017-03-01 NOTE — Progress Notes (Signed)
Cardiology Office Note    Date:  03/02/2017   ID:  RHIAN FUNARI, DOB December 22, 1950, MRN [id number]  PCP:  Tammi Sou, MD  Cardiologist: Sinclair Grooms, MD   Chief Complaint  Patient presents with  . Coronary Artery Disease    History of Present Illness:  Edwin Padilla is a 66 y.o. male who presents for CAD, CABG with LIMA to LAD and SVG to diagonal 1, hyperlipidemia, untreated HIV infection.  Edwin Padilla is doing well. He runs 3 times per week. He has had a few episodes of profound decrease in blood pressure and near fainting. Blood pressures less than 90 have been documented on these occasions. No chest discomfort, palpitations, or severe dyspnea. Overall he feels well. He had an exercise treadmill test in June 2017 that was unremarkable for ischemia.   Past Medical History:  Diagnosis Date  . Allergy    Cat allergy  . Asthma   . Coronary atherosclerosis of unspecified type of vessel, native or graft    CABG 07/2003; annual cardiology f/u (Dr. Pernell Dupre)  . HIV infection ([place]) Dx 02/2002   ID clinic w/ [facility].  . Hyperlipidemia   . 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 Myocardial infarction 2004  . Right inguinal hernia    s/p repair 03/2013  . Substance abuse    hx of    Past Surgical History:  Procedure Laterality Date  . COLONOSCOPY  01/2011   Normal (recall 10 yrs)  . CORONARY ARTERY BYPASS GRAFT  2004  . HERNIA REPAIR     as infant  . INGUINAL HERNIA REPAIR Right 04/09/2013   Procedure: RIGHT HERNIA REPAIR INGUINAL ADULT;  Surgeon: Haywood Lasso, MD;  Location: [place];  Service: General;  Laterality: Right;  . TONSILLECTOMY      Current Medications: Outpatient Medications Prior to Visit  Medication Sig Dispense Refill  . albuterol (VENTOLIN HFA) 108 (90 Base) MCG/ACT inhaler Inhale 2 puffs into the lungs every 6 (six) hours as needed for wheezing or shortness of breath. 1 Inhaler 1  . aspirin 81 MG chewable tablet Chew 81 mg by  mouth daily.      Marland Kitchen atorvastatin (LIPITOR) 40 MG tablet TAKE 1 TABLET BY MOUTH  DAILY 90 tablet 1  . carbamide peroxide (DEBROX) 6.5 % otic solution 5 drops BID with cerumen impaction 15 mL 2  . efavirenz-emtricitabine-tenofovir (ATRIPLA) 600-200-300 MG tablet Take 1 tablet by mouth at bedtime. 90 tablet 2  . lisinopril (PRINIVIL,ZESTRIL) 5 MG tablet TAKE 1 TABLET BY MOUTH  DAILY 90 tablet 1   No facility-administered medications prior to visit.      Allergies:   Patient has no known allergies.   Social History   Social History  . Marital status: Single    Spouse name: N/A  . Number of children: N/A  . Years of education: N/A   Social History Main Topics  . Smoking status: Former Smoker    Years: 35.00    Types: Cigarettes    Quit date: 12/12/2003  . Smokeless tobacco: Never Used  . Alcohol use No     Comment: 2005  . Drug use: No     Comment: past history of crystal meth addiction -clean since 2005   . Sexual activity: Not Currently     Comment: 2005   Other Topics Concern  . None   Social History Narrative   Homosexual.   Never married.   Hx of  methamph abuse; no IV drug use.  [facility] remission drug program, no use since 2005.   Exercises regularly, says he eats a good diet.   Tob: quit 2004, 50 pack-yr hx.   No alcohol.     Family History:  The patient's family history includes Cancer (age of onset: 32) in his brother; Colon cancer (age of onset: 24) in his father; Heart disease in his father; Heart failure in his father; Stroke (age of onset: 33) in his mother.   ROS:   Please see the history of present illness.    Near syncopal episode post exercise with profound dizziness. His primary physician Dr. Melford Aase held lisinopril for several days. He has been on lisinopril since surgery. Not sure why. All other systems reviewed and are negative.   PHYSICAL EXAM:   VS:  BP 110/74 (BP Location: Left Arm)   Pulse (!) 46   Ht 5\' 10"  (1.778 m)   Wt 165 lb (74.8 kg)    BMI 23.68 kg/m    GEN: Well nourished, well developed, in no acute distress  HEENT: normal  Neck: no JVD, carotid bruits, or masses Cardiac: RRR; no murmurs, rubs, or gallops,no edema  Respiratory:  clear to auscultation bilaterally, normal work of breathing GI: soft, nontender, nondistended, + BS MS: no deformity or atrophy  Skin: warm and dry, no rash Neuro:  Alert and Oriented x 3, Strength and sensation are intact Psych: euthymic mood, full affect  Wt Readings from Last 3 Encounters:  03/02/17 165 lb (74.8 kg)  02/20/17 162 lb 4 oz (73.6 kg)  11/06/16 163 lb (73.9 kg)      Studies/Labs Reviewed:   EKG:  EKG  Sinus bradycardia and otherwise normal.  Recent Labs: 04/19/2016: ALT 43; BUN 20; Creat 1.12; Hemoglobin 14.2; Platelets 313; Potassium 4.7; Sodium 138   Lipid Panel    Component Value Date/Time   CHOL 169 04/19/2016 0905   TRIG 83 04/19/2016 0905   HDL 58 04/19/2016 0905   CHOLHDL 2.9 04/19/2016 0905   VLDL 17 04/19/2016 0905   LDLCALC 94 04/19/2016 0905    Additional studies/ records that were reviewed today include:  Exercise treadmill test 05/30/16:  Blood pressure demonstrated a hypertensive response to exercise.  There was no ST segment deviation noted during stress.  No T wave inversion was noted during stress.  Excellent exercise capacity.  Negative, adequate stress test.   ASSESSMENT:    1. Coronary artery disease of bypass graft of native heart with stable angina pectoris ([place])   2. Other hyperlipidemia   3. Near syncope   4. HIV (human immunodeficiency virus infection) ([place])      PLAN:  In order of problems listed above:  1. He is stable. No ischemia on recent stress test. We do not have any documentation of LV function. He has not needed nitroglycerin. Continue to control risk factors including lipids to LDL less than 70 blood pressure less than 130/90 mmHg. I encouraged continued aerobic activity. Assess residual LV function in this  patient who is now 14 years post CABG 2. Last LDL in our records was 94. Lipids will be reevaluated along with a liver panel fasting. Target LDL is 70. 3. Considered discontinuation of the low-dose lisinopril if LV function is normal on an echocardiogram that we will perform soon.  Continue current therapy. Liver and lipid panel. 2-D Doppler echocardiogram to update left ventricular function given prior infarction and bypass surgery 14 years ago. Clinical follow-up in one year.  Medication Adjustments/Labs and Tests Ordered: Current medicines are reviewed at length with the patient today.  Concerns regarding medicines are outlined above.  Medication changes, Labs and Tests ordered today are listed in the Patient Instructions below. Patient Instructions  Medication Instructions:  None  Labwork: Your physician recommends that you return for lab work when fasting (lipid, liver)   Testing/Procedures: Your physician has requested that you have an echocardiogram. Echocardiography is a painless test that uses sound waves to create images of your heart. It provides your doctor with information about the size and shape of your heart and how well your heart's chambers and valves are working. This procedure takes approximately one hour. There are no restrictions for this procedure.   Follow-Up: Your physician wants you to follow-up in: 1 year with Dr. Tamala Julian. You will receive a reminder letter in the mail two months in advance. If you don't receive a letter, please call our office to schedule the follow-up appointment.   Any Other Special Instructions Will Be Listed Below (If Applicable).     If you need a refill on your cardiac medications before your next appointment, please call your pharmacy.      Signed, Sinclair Grooms, MD  03/02/2017 5:01 PM    [facility] [address], [place], [place]  [address] Phone: [phone]; Fax: [phone]

## 2017-03-02 ENCOUNTER — Encounter: Payer: Self-pay | Admitting: Interventional Cardiology

## 2017-03-02 ENCOUNTER — Ambulatory Visit (INDEPENDENT_AMBULATORY_CARE_PROVIDER_SITE_OTHER): Payer: 59 | Admitting: Interventional Cardiology

## 2017-03-02 VITALS — BP 110/74 | HR 46 | Ht 70.0 in | Wt 165.0 lb

## 2017-03-02 DIAGNOSIS — E7849 Other hyperlipidemia: Secondary | ICD-10-CM

## 2017-03-02 DIAGNOSIS — I25708 Atherosclerosis of coronary artery bypass graft(s), unspecified, with other forms of angina pectoris: Secondary | ICD-10-CM | POA: Diagnosis not present

## 2017-03-02 DIAGNOSIS — R55 Syncope and collapse: Secondary | ICD-10-CM

## 2017-03-02 DIAGNOSIS — E784 Other hyperlipidemia: Secondary | ICD-10-CM | POA: Diagnosis not present

## 2017-03-02 DIAGNOSIS — B20 Human immunodeficiency virus [HIV] disease: Secondary | ICD-10-CM | POA: Diagnosis not present

## 2017-03-02 NOTE — Patient Instructions (Signed)
Medication Instructions:  None  Labwork: Your physician recommends that you return for lab work when fasting (lipid, liver)   Testing/Procedures: Your physician has requested that you have an echocardiogram. Echocardiography is a painless test that uses sound waves to create images of your heart. It provides your doctor with information about the size and shape of your heart and how well your heart's chambers and valves are working. This procedure takes approximately one hour. There are no restrictions for this procedure.   Follow-Up: Your physician wants you to follow-up in: 1 year with Dr. Tamala Julian. You will receive a reminder letter in the mail two months in advance. If you don't receive a letter, please call our office to schedule the follow-up appointment.   Any Other Special Instructions Will Be Listed Below (If Applicable).     If you need a refill on your cardiac medications before your next appointment, please call your pharmacy.

## 2017-03-05 ENCOUNTER — Encounter: Payer: Self-pay | Admitting: Family Medicine

## 2017-03-19 ENCOUNTER — Other Ambulatory Visit: Payer: Self-pay | Admitting: Interventional Cardiology

## 2017-03-19 ENCOUNTER — Other Ambulatory Visit: Payer: 59

## 2017-03-19 ENCOUNTER — Other Ambulatory Visit: Payer: Self-pay

## 2017-03-19 ENCOUNTER — Ambulatory Visit (HOSPITAL_COMMUNITY): Payer: 59 | Attending: Cardiovascular Disease

## 2017-03-19 DIAGNOSIS — I252 Old myocardial infarction: Secondary | ICD-10-CM | POA: Insufficient documentation

## 2017-03-19 DIAGNOSIS — I071 Rheumatic tricuspid insufficiency: Secondary | ICD-10-CM | POA: Diagnosis not present

## 2017-03-19 DIAGNOSIS — I25708 Atherosclerosis of coronary artery bypass graft(s), unspecified, with other forms of angina pectoris: Secondary | ICD-10-CM | POA: Diagnosis not present

## 2017-03-19 DIAGNOSIS — E785 Hyperlipidemia, unspecified: Secondary | ICD-10-CM | POA: Diagnosis not present

## 2017-03-19 DIAGNOSIS — Z87891 Personal history of nicotine dependence: Secondary | ICD-10-CM | POA: Diagnosis not present

## 2017-03-19 DIAGNOSIS — J45909 Unspecified asthma, uncomplicated: Secondary | ICD-10-CM | POA: Diagnosis not present

## 2017-03-19 HISTORY — PX: TRANSTHORACIC ECHOCARDIOGRAM: SHX275

## 2017-03-19 LAB — SPECIMEN STATUS

## 2017-03-19 LAB — HEPATIC FUNCTION PANEL
ALT: 35 IU/L (ref 0–44)
AST: 24 IU/L (ref 0–40)
Albumin: 4 g/dL (ref 3.6–4.8)
Alkaline Phosphatase: 76 IU/L (ref 39–117)
BILIRUBIN, DIRECT: 0.1 mg/dL (ref 0.00–0.40)
Bilirubin Total: 0.3 mg/dL (ref 0.0–1.2)
TOTAL PROTEIN: 6.6 g/dL (ref 6.0–8.5)

## 2017-03-20 LAB — LIPID PANEL
CHOL/HDL RATIO: 2.8 ratio (ref 0.0–5.0)
Cholesterol, Total: 162 mg/dL (ref 100–199)
HDL: 58 mg/dL (ref >39)
LDL CALC: 91 mg/dL (ref 0–99)
TRIGLYCERIDES: 66 mg/dL (ref 0–149)
VLDL CHOLESTEROL CAL: 13 mg/dL (ref 5–40)

## 2017-03-22 ENCOUNTER — Telehealth: Payer: Self-pay | Admitting: Family Medicine

## 2017-03-22 ENCOUNTER — Encounter: Payer: Self-pay | Admitting: Family Medicine

## 2017-03-22 NOTE — Telephone Encounter (Signed)
Immunization report printed and put in box to be mailed.

## 2017-03-22 NOTE — Telephone Encounter (Signed)
Patient is requesting a copy of his TDAP report to be mailed to him. Thank you

## 2017-03-24 ENCOUNTER — Other Ambulatory Visit: Payer: Self-pay | Admitting: Family Medicine

## 2017-03-24 DIAGNOSIS — E785 Hyperlipidemia, unspecified: Secondary | ICD-10-CM

## 2017-03-26 NOTE — Telephone Encounter (Signed)
[facility].   RF request for lisinopril LOV: 03/15/16 Next ov: None Last written: 10/09/16 #90 w/ 1Rf  RF request for atorvastatin Last written: 10/09/16 #90 w/ 1RF  Will send Rx's for #90 w/ 0Rf. Pt is due for CPE or f/u [facility] needs office visit for more refills.

## 2017-03-26 NOTE — Telephone Encounter (Signed)
SW pt and he stated that Dr. Tamala Julian was in the process of changing these medications. I advised pt that if he wants Dr. Tamala Julian to manage these medications that's fine, Dr. Tamala Julian will need to take over refilling his medications but if the wants Dr. Anitra Lauth to refill these he will need to schedule a f/u apt with Dr. Anitra Lauth. Pt stated that he "thought that we worked together". I advised pt that yes we can see Dr. Darliss Ridgel notes but in order for [place] to fill his medications we have to have documentation that he has been seen by [place] (Dr. Anitra Lauth). Pt stated he will call back later.

## 2017-04-24 ENCOUNTER — Encounter: Payer: 59 | Admitting: Family Medicine

## 2017-04-24 ENCOUNTER — Telehealth: Payer: Self-pay | Admitting: Family Medicine

## 2017-04-24 ENCOUNTER — Encounter: Payer: Self-pay | Admitting: Family Medicine

## 2017-04-24 NOTE — Telephone Encounter (Signed)
Patient called stating that he missed his CPE this morning because he "had the same thing he had last time"  He did not elaborate.  He also said that he tried to contact us this morning but he kept getting a recording from yesterdays meeting.

## 2017-04-24 NOTE — Telephone Encounter (Signed)
Apt rescheduled for 04/27/17 at 1:00pm.

## 2017-04-24 NOTE — Progress Notes (Deleted)
Office Note 04/24/2017  CC: No chief complaint on file.   HPI:  Edwin Padilla is a 66 y.o. White male who is here for annual health maintenance exam.   Past Medical History:  Diagnosis Date  . Allergy    Cat allergy  . Asthma   . Coronary atherosclerosis of unspecified type of vessel, native or graft    CABG 07/2003; annual cardiology f/u (Dr. Pernell Dupre).  Stable as of 02/2017 f/u--echo to be done to assess LV function.  Consider d/c of ACE-I if LV function normal--per Dr. Tamala Julian.  Marland Kitchen HIV infection ([place]) Dx 02/2002   ID clinic w/ [facility].  . Hyperlipidemia   . 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 Myocardial infarction ([place]) 2004  . Right inguinal hernia    s/p repair 03/2013  . Substance abuse    hx of    Past Surgical History:  Procedure Laterality Date  . CARDIOVASCULAR STRESS TEST  05/2016   No ischemia  . COLONOSCOPY  01/2011   Normal (recall 10 yrs)  . CORONARY ARTERY BYPASS GRAFT  2004  . HERNIA REPAIR     as infant  . INGUINAL HERNIA REPAIR Right 04/09/2013   Procedure: RIGHT HERNIA REPAIR INGUINAL ADULT;  Surgeon: Haywood Lasso, MD;  Location: [place];  Service: General;  Laterality: Right;  . TONSILLECTOMY    . TRANSTHORACIC ECHOCARDIOGRAM  03/19/2017   Normal    Family History  Problem Relation Age of Onset  . Stroke Mother 43  . Heart failure Father   . Heart disease Father   . Colon cancer Father 8  . Cancer Brother 43       lymphoma    Social History   Social History  . Marital status: Single    Spouse name: N/A  . Number of children: N/A  . Years of education: N/A   Occupational History  . Not on file.   Social History Main Topics  . Smoking status: Former Smoker    Years: 35.00    Types: Cigarettes    Quit date: 12/12/2003  . Smokeless tobacco: Never Used  . Alcohol use No     Comment: 2005  . Drug use: No     Comment: past history of crystal meth addiction -clean since 2005   . Sexual activity: Not  Currently     Comment: 2005   Other Topics Concern  . Not on file   Social History Narrative   Homosexual.   Never married.   Hx of methamph abuse; no IV drug use.  [facility] remission drug program, no use since 2005.   Exercises regularly, says he eats a good diet.   Tob: quit 2004, 50 pack-yr hx.   No alcohol.    Outpatient Medications Prior to Visit  Medication Sig Dispense Refill  . albuterol (VENTOLIN HFA) 108 (90 Base) MCG/ACT inhaler Inhale 2 puffs into the lungs every 6 (six) hours as needed for wheezing or shortness of breath. 1 Inhaler 1  . aspirin 81 MG chewable tablet Chew 81 mg by mouth daily.      Marland Kitchen atorvastatin (LIPITOR) 40 MG tablet TAKE 1 TABLET BY MOUTH  DAILY 90 tablet 0  . carbamide peroxide (DEBROX) 6.5 % otic solution 5 drops BID with cerumen impaction 15 mL 2  . efavirenz-emtricitabine-tenofovir (ATRIPLA) 600-200-300 MG tablet Take 1 tablet by mouth at bedtime. 90 tablet 2  . lisinopril (PRINIVIL,ZESTRIL) 5 MG tablet TAKE 1 TABLET BY MOUTH  DAILY 90 tablet 0   No facility-administered medications prior to visit.     No Known Allergies  ROS *** PE; There were no vitals taken for this visit. *** Pertinent labs:   Lab Results  Component Value Date   WBC WILL FOLLOW 03/19/2017   HGB 14.2 04/19/2016   HCT WILL FOLLOW 03/19/2017   MCV WILL FOLLOW 03/19/2017   PLT WILL FOLLOW 03/19/2017   Lab Results  Component Value Date   CREATININE 1.12 04/19/2016   BUN 20 04/19/2016   NA 138 04/19/2016   K 4.7 04/19/2016   CL 102 04/19/2016   CO2 26 04/19/2016   Lab Results  Component Value Date   ALT 35 03/19/2017   AST 24 03/19/2017   ALKPHOS 76 03/19/2017   BILITOT 0.3 03/19/2017   Lab Results  Component Value Date   CHOL 162 03/19/2017   Lab Results  Component Value Date   HDL 58 03/19/2017   Lab Results  Component Value Date   LDLCALC 91 03/19/2017   Lab Results  Component Value Date   TRIG 66 03/19/2017   Lab Results  Component  Value Date   CHOLHDL 2.8 03/19/2017   Lab Results  Component Value Date   PSA 0.46 03/15/2016   Lab Results  Component Value Date   CD4TCELL 35 10/25/2016   CD4TABS 770 10/25/2016   Lab Results  Component Value Date   HIV1RNAQUANT <20 10/25/2016   Lab Results  Component Value Date   HEPAIGM NEG 08/03/2011   HEPBIGM NEG 08/03/2011   Lab Results  Component Value Date   HCVAB NEGATIVE 02/16/2014     ASSESSMENT AND PLAN:   Health maintenance exam: Reviewed age and gender appropriate health maintenance issues (prudent diet, regular exercise, health risks of tobacco and excessive alcohol, use of seatbelts, fire alarms in home, use of sunscreen).  Also reviewed age and gender appropriate health screening as well as vaccine recommendations. Vaccines all UTD: discussed shingrix. Labs all done recently except BMET, which we'll do today. Prostate ca screening: DRE  , PSA today. Colon ca screening: next colonoscopy due 2022.  An After Visit Summary was printed and given to the patient.  FOLLOW UP:  No Follow-up on file.  Signed:  Crissie Sickles, MD           04/24/2017

## 2017-04-27 ENCOUNTER — Ambulatory Visit (INDEPENDENT_AMBULATORY_CARE_PROVIDER_SITE_OTHER): Payer: 59 | Admitting: Family Medicine

## 2017-04-27 ENCOUNTER — Encounter: Payer: Self-pay | Admitting: Family Medicine

## 2017-04-27 VITALS — BP 105/68 | HR 55 | Temp 98.2°F | Resp 16 | Ht 69.0 in | Wt 161.5 lb

## 2017-04-27 DIAGNOSIS — R42 Dizziness and giddiness: Secondary | ICD-10-CM | POA: Diagnosis not present

## 2017-04-27 DIAGNOSIS — Z0000 Encounter for general adult medical examination without abnormal findings: Secondary | ICD-10-CM | POA: Diagnosis not present

## 2017-04-27 DIAGNOSIS — Z125 Encounter for screening for malignant neoplasm of prostate: Secondary | ICD-10-CM | POA: Diagnosis not present

## 2017-04-27 LAB — BASIC METABOLIC PANEL
BUN: 23 mg/dL (ref 6–23)
CALCIUM: 9.5 mg/dL (ref 8.4–10.5)
CHLORIDE: 102 meq/L (ref 96–112)
CO2: 28 meq/L (ref 19–32)
CREATININE: 1.05 mg/dL (ref 0.40–1.50)
GFR: 75.17 mL/min (ref >60.00)
Glucose, Bld: 91 mg/dL (ref 70–99)
Potassium: 4.3 mEq/L (ref 3.5–5.1)
Sodium: 138 mEq/L (ref 135–145)

## 2017-04-27 LAB — PSA: PSA: 0.43 ng/mL (ref 0.10–4.00)

## 2017-04-27 LAB — TSH: TSH: 1.48 u[IU]/mL (ref 0.35–4.50)

## 2017-04-27 MED ORDER — ATORVASTATIN CALCIUM 40 MG PO TABS: 40.0000 mg | ORAL_TABLET | Freq: Every day | ORAL | 3 refills | Status: DC

## 2017-04-27 NOTE — Patient Instructions (Signed)
 Health Maintenance, Male A healthy lifestyle and preventive care is important for your health and wellness. Ask your health care provider about what schedule of regular examinations is right for you. What should I know about weight and diet?  Eat a Healthy Diet  Eat plenty of vegetables, fruits, whole grains, low-fat dairy products, and lean protein.  Do not eat a lot of foods high in solid fats, added sugars, or salt. Maintain a Healthy Weight  Regular exercise can help you achieve or maintain a healthy weight. You should:  Do at least 150 minutes of exercise each week. The exercise should increase your heart rate and make you sweat (moderate-intensity exercise).  Do strength-training exercises at least twice a week. Watch Your Levels of Cholesterol and Blood Lipids  Have your blood tested for lipids and cholesterol every 5 years starting at 66 years of age. If you are at high risk for heart disease, you should start having your blood tested when you are 66 years old. You may need to have your cholesterol levels checked more often if:  Your lipid or cholesterol levels are high.  You are older than 66 years of age.  You are at high risk for heart disease. What should I know about cancer screening? Many types of cancers can be detected early and may often be prevented. Lung Cancer  You should be screened every year for lung cancer if:  You are a current smoker who has smoked for at least 30 years.  You are a former smoker who has quit within the past 15 years.  Talk to your health care provider about your screening options, when you should start screening, and how often you should be screened. Colorectal Cancer  Routine colorectal cancer screening usually begins at 66 years of age and should be repeated every 5-10 years until you are 66 years old. You may need to be screened more often if early forms of precancerous polyps or small growths are found. Your health care provider  may recommend screening at an earlier age if you have risk factors for colon cancer.  Your health care provider may recommend using home test kits to check for hidden blood in the stool.  A small camera at the end of a tube can be used to examine your colon (sigmoidoscopy or colonoscopy). This checks for the earliest forms of colorectal cancer. Prostate and Testicular Cancer  Depending on your age and overall health, your health care provider may do certain tests to screen for prostate and testicular cancer.  Talk to your health care provider about any symptoms or concerns you have about testicular or prostate cancer. Skin Cancer  Check your skin from head to toe regularly.  Tell your health care provider about any new moles or changes in moles, especially if:  There is a change in a mole's size, shape, or color.  You have a mole that is larger than a pencil eraser.  Always use sunscreen. Apply sunscreen liberally and repeat throughout the day.  Protect yourself by wearing long sleeves, pants, a wide-brimmed hat, and sunglasses when outside. What should I know about heart disease, diabetes, and high blood pressure?  If you are 18-39 years of age, have your blood pressure checked every 3-5 years. If you are 40 years of age or older, have your blood pressure checked every year. You should have your blood pressure measured twice-once when you are at a hospital or clinic, and once when you are not at   a hospital or clinic. Record the average of the two measurements. To check your blood pressure when you are not at a hospital or clinic, you can use:  An automated blood pressure machine at a pharmacy.  A home blood pressure monitor.  Talk to your health care provider about your target blood pressure.  If you are between 45-79 years old, ask your health care provider if you should take aspirin to prevent heart disease.  Have regular diabetes screenings by checking your fasting blood sugar  level.  If you are at a normal weight and have a low risk for diabetes, have this test once every three years after the age of 45.  If you are overweight and have a high risk for diabetes, consider being tested at a younger age or more often.  A one-time screening for abdominal aortic aneurysm (AAA) by ultrasound is recommended for men aged 65-75 years who are current or former smokers. What should I know about preventing infection? Hepatitis B  If you have a higher risk for hepatitis B, you should be screened for this virus. Talk with your health care provider to find out if you are at risk for hepatitis B infection. Hepatitis C  Blood testing is recommended for:  Everyone born from 1945 through 1965.  Anyone with known risk factors for hepatitis C. Sexually Transmitted Diseases (STDs)  You should be screened each year for STDs including gonorrhea and chlamydia if:  You are sexually active and are younger than 66 years of age.  You are older than 66 years of age and your health care provider tells you that you are at risk for this type of infection.  Your sexual activity has changed since you were last screened and you are at an increased risk for chlamydia or gonorrhea. Ask your health care provider if you are at risk.  Talk with your health care provider about whether you are at high risk of being infected with HIV. Your health care provider may recommend a prescription medicine to help prevent HIV infection. What else can I do?  Schedule regular health, dental, and eye exams.  Stay current with your vaccines (immunizations).  Do not use any tobacco products, such as cigarettes, chewing tobacco, and e-cigarettes. If you need help quitting, ask your health care provider.  Limit alcohol intake to no more than 2 drinks per day. One drink equals 12 ounces of beer, 5 ounces of wine, or 1 ounces of hard liquor.  Do not use street drugs.  Do not share needles.  Ask your health  care provider for help if you need support or information about quitting drugs.  Tell your health care provider if you often feel depressed.  Tell your health care provider if you have ever been abused or do not feel safe at home. This information is not intended to replace advice given to you by your health care provider. Make sure you discuss any questions you have with your health care provider. Document Released: 05/25/2008 Document Revised: 07/26/2016 Document Reviewed: 08/31/2015 Elsevier Interactive Patient Education  2017 [employer] Inc[employer]  

## 2017-04-27 NOTE — Progress Notes (Signed)
Office Note 04/27/2017  CC:  Chief Complaint  Patient presents with  . Annual Exam    Pt is not fasting.     HPI:  Edwin Padilla is a 66 y.o. White male who is here for annual health maintenance exam. Of note, recent cholesterol panel and liver panel were normal.  Had another episode of 2 days worth of lightheadedness and no other sx's.  This came after he ran a lot one morning, peaked that night and persisted through the next day.  No vertigo, no palpitations, no heart racing, no presyncope or syncope, no HA, no CP, no SOB.  It resolved completely after about 2 days.  Eyes: exam UTD. Dental: preventatives UTD. Exercise: he does LOTS of cardiovascular exercise. Diet: says he eats a good diet but is going to have renewed attention to fats/carbs since his cardiologist told him his LDL was still a little too high.     Past Medical History:  Diagnosis Date  . Allergy    Cat allergy  . Asthma   . Coronary atherosclerosis of unspecified type of vessel, native or graft    CABG 07/2003; annual cardiology f/u (Dr. Pernell Dupre).  Stable as of 02/2017 f/u--echo to be done to assess LV function.  Consider d/c of ACE-I if LV function normal--per Dr. Tamala Julian.  Marland Kitchen HIV infection ([place]) Dx 02/2002   ID clinic w/ [facility].  . Hyperlipidemia   . 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 Myocardial infarction ([place]) 2004  . Right inguinal hernia    s/p repair 03/2013  . Substance abuse    hx of    Past Surgical History:  Procedure Laterality Date  . CARDIOVASCULAR STRESS TEST  05/2016   No ischemia  . COLONOSCOPY  01/2011   Normal (recall 10 yrs)  . CORONARY ARTERY BYPASS GRAFT  2004  . HERNIA REPAIR     as infant  . INGUINAL HERNIA REPAIR Right 04/09/2013   Procedure: RIGHT HERNIA REPAIR INGUINAL ADULT;  Surgeon: Haywood Lasso, MD;  Location: [place];  Service: General;  Laterality: Right;  . TONSILLECTOMY    . TRANSTHORACIC ECHOCARDIOGRAM  03/19/2017   Normal     Family History  Problem Relation Age of Onset  . Stroke Mother 83  . Heart failure Father   . Heart disease Father   . Colon cancer Father 21  . Cancer Brother 58       lymphoma    Social History   Social History  . Marital status: Single    Spouse name: N/A  . Number of children: N/A  . Years of education: N/A   Occupational History  . Not on file.   Social History Main Topics  . Smoking status: Former Smoker    Years: 35.00    Types: Cigarettes    Quit date: 12/12/2003  . Smokeless tobacco: Never Used  . Alcohol use No     Comment: 2005  . Drug use: No     Comment: past history of crystal meth addiction -clean since 2005   . Sexual activity: Not Currently     Comment: 2005   Other Topics Concern  . Not on file   Social History Narrative   Homosexual.   Never married.   Hx of methamph abuse; no IV drug use.  [place] remission drug program, no use since 2005.   Exercises regularly, says he eats a good diet.   Tob: quit 2004, 50 pack-yr hx.  No alcohol.    Outpatient Medications Prior to Visit  Medication Sig Dispense Refill  . albuterol (VENTOLIN HFA) 108 (90 Base) MCG/ACT inhaler Inhale 2 puffs into the lungs every 6 (six) hours as needed for wheezing or shortness of breath. 1 Inhaler 1  . aspirin 81 MG chewable tablet Chew 81 mg by mouth daily.      . carbamide peroxide (DEBROX) 6.5 % otic solution 5 drops BID with cerumen impaction 15 mL 2  . efavirenz-emtricitabine-tenofovir (ATRIPLA) 600-200-300 MG tablet Take 1 tablet by mouth at bedtime. 90 tablet 2  . atorvastatin (LIPITOR) 40 MG tablet TAKE 1 TABLET BY MOUTH  DAILY 90 tablet 0  . lisinopril (PRINIVIL,ZESTRIL) 5 MG tablet TAKE 1 TABLET BY MOUTH  DAILY 90 tablet 0   No facility-administered medications prior to visit.     No Known Allergies  ROS Review of Systems  Constitutional: Negative for appetite change, chills, fatigue and fever.  HENT: Negative for congestion, dental problem, ear pain  and sore throat.   Eyes: Negative for discharge, redness and visual disturbance.  Respiratory: Negative for cough, chest tightness, shortness of breath and wheezing.   Cardiovascular: Negative for chest pain, palpitations and leg swelling.  Gastrointestinal: Negative for abdominal pain, blood in stool, diarrhea, nausea and vomiting.  Genitourinary: Negative for difficulty urinating, dysuria, flank pain, frequency, hematuria and urgency.  Musculoskeletal: Negative for arthralgias, back pain, joint swelling, myalgias and neck stiffness.  Skin: Negative for pallor and rash.  Neurological: Positive for light-headedness (see hpi). Negative for dizziness, speech difficulty, weakness and headaches.  Hematological: Negative for adenopathy. Does not bruise/bleed easily.  Psychiatric/Behavioral: Negative for confusion and sleep disturbance. The patient is not nervous/anxious.     PE; Blood pressure 105/68, pulse (!) 55, temperature 98.2 F (36.8 C), temperature source Oral, resp. rate 16, height 5\' 9"  (1.753 m), weight 161 lb 8 oz (73.3 kg), SpO2 98 %. Gen: Alert, well appearing.  Patient is oriented to person, place, time, and situation. AFFECT: pleasant, lucid thought and speech. ENT: Ears: EACs clear, normal epithelium.  TMs with good light reflex and landmarks bilaterally.  Eyes: no injection, icteris, swelling, or exudate.  EOMI, PERRLA. Nose: no drainage or turbinate edema/swelling.  No injection or focal lesion.  Mouth: lips without lesion/swelling.  Oral mucosa pink and moist.  Dentition intact and without obvious caries or gingival swelling.  Oropharynx without erythema, exudate, or swelling.  Neck: supple/nontender.  No LAD, mass, or TM.  Carotid pulses 2+ bilaterally, without bruits. CV: RRR, no m/r/g.   LUNGS: CTA bilat, nonlabored resps, good aeration in all lung fields. ABD: soft, NT, ND, BS normal.  No hepatospenomegaly or mass.  No bruits. EXT: no clubbing, cyanosis, or edema.   Musculoskeletal: no joint swelling, erythema, warmth, or tenderness.  ROM of all joints intact. Skin - no sores or suspicious lesions or rashes or color changes Rectal exam: negative without mass, lesions or tenderness, PROSTATE EXAM: smooth and symmetric without nodules or tenderness.   Pertinent labs:   Lab Results  Component Value Date   WBC WILL FOLLOW 03/19/2017   HGB 14.2 04/19/2016   HCT WILL FOLLOW 03/19/2017   MCV WILL FOLLOW 03/19/2017   PLT WILL FOLLOW 03/19/2017   Lab Results  Component Value Date   CREATININE 1.12 04/19/2016   BUN 20 04/19/2016   NA 138 04/19/2016   K 4.7 04/19/2016   CL 102 04/19/2016   CO2 26 04/19/2016   Lab Results  Component Value Date  ALT 35 03/19/2017   AST 24 03/19/2017   ALKPHOS 76 03/19/2017   BILITOT 0.3 03/19/2017   Lab Results  Component Value Date   CHOL 162 03/19/2017   Lab Results  Component Value Date   HDL 58 03/19/2017   Lab Results  Component Value Date   LDLCALC 91 03/19/2017   Lab Results  Component Value Date   TRIG 66 03/19/2017   Lab Results  Component Value Date   CHOLHDL 2.8 03/19/2017   Lab Results  Component Value Date   PSA 0.46 03/15/2016   ASSESSMENT AND PLAN:   1) Health maintenance exam: Reviewed age and gender appropriate health maintenance issues (prudent diet, regular exercise, health risks of tobacco and excessive alcohol, use of seatbelts, fire alarms in home, use of sunscreen).  Also reviewed age and gender appropriate health screening as well as vaccine recommendations. Vaccines: UTD--discussed shingrix with pt--he wants to get this so I gave him rx to take to pharmacy. Labs today: BMET and TSH (recent hepatic panel and lipid panel excellent. ( ID following CBCs and HIV labs). Prostate ca screening: DRE normal , PSA drawn today. Colon ca screening: next colonoscopy due 2022.  2) Episodic lightheadedness: seems to come after he has exercised a lot and not stayed well  hydrated. Recent echo showed normal EF/LV fxn. BP low normal.  Will d/c lisinopril (as already mentioned as a consideration by his cardiologist if his LV function was normal) and see how things go.  Once again encouraged aggressive hydration habits, esp surrounding times of vigorous exercise.  An After Visit Summary was printed and given to the patient.  FOLLOW UP:  Return in about 3 months (around 07/28/2017) for f/u dizziness and bp.  Signed:  Crissie Sickles, MD           04/27/2017

## 2017-06-14 ENCOUNTER — Other Ambulatory Visit: Payer: Self-pay | Admitting: Family Medicine

## 2017-06-15 ENCOUNTER — Encounter: Payer: Self-pay | Admitting: Family Medicine

## 2017-06-15 MED ORDER — ATORVASTATIN CALCIUM 40 MG PO TABS: 40.0000 mg | ORAL_TABLET | Freq: Every day | ORAL | 3 refills | Status: DC

## 2017-09-04 ENCOUNTER — Telehealth: Payer: Self-pay | Admitting: Pharmacy Technician

## 2017-09-04 NOTE — Telephone Encounter (Signed)
Emailed him signed form for copay assistance.

## 2017-09-28 DIAGNOSIS — Z23 Encounter for immunization: Secondary | ICD-10-CM | POA: Diagnosis not present

## 2017-10-26 ENCOUNTER — Other Ambulatory Visit: Payer: Medicare Other

## 2017-10-26 DIAGNOSIS — Z113 Encounter for screening for infections with a predominantly sexual mode of transmission: Secondary | ICD-10-CM

## 2017-10-26 DIAGNOSIS — E7800 Pure hypercholesterolemia, unspecified: Secondary | ICD-10-CM | POA: Diagnosis not present

## 2017-10-26 DIAGNOSIS — B20 Human immunodeficiency virus [HIV] disease: Secondary | ICD-10-CM | POA: Diagnosis not present

## 2017-10-26 LAB — T-HELPER CELL (CD4) - (RCID CLINIC ONLY)
CD4 T CELL ABS: 810 /uL (ref 400–2700)
CD4 T CELL HELPER: 33 % (ref 33–55)

## 2017-10-29 LAB — CBC WITH DIFFERENTIAL/PLATELET
BASOS ABS: 32 {cells}/uL (ref 0–200)
Basophils Relative: 0.5 %
EOS ABS: 340 {cells}/uL (ref 15–500)
Eosinophils Relative: 5.4 %
HCT: 39.2 % (ref 38.5–50.0)
Hemoglobin: 13.6 g/dL (ref 13.2–17.1)
Lymphs Abs: 2400 cells/uL (ref 850–3900)
MCH: 31.3 pg (ref 27.0–33.0)
MCHC: 34.7 g/dL (ref 32.0–36.0)
MCV: 90.1 fL (ref 80.0–100.0)
MONOS PCT: 8.7 %
MPV: 10.1 fL (ref 7.5–12.5)
Neutro Abs: 2980 cells/uL (ref 1500–7800)
Neutrophils Relative %: 47.3 %
PLATELETS: 321 10*3/uL (ref 140–400)
RBC: 4.35 10*6/uL (ref 4.20–5.80)
RDW: 13.4 % (ref 11.0–15.0)
TOTAL LYMPHOCYTE: 38.1 %
WBC: 6.3 10*3/uL (ref 3.8–10.8)
WBCMIX: 548 {cells}/uL (ref 200–950)

## 2017-10-29 LAB — LIPID PANEL
CHOL/HDL RATIO: 2.6 (calc) (ref <5.0)
CHOLESTEROL: 164 mg/dL (ref <200)
HDL: 64 mg/dL (ref >40)
LDL CHOLESTEROL (CALC): 85 mg/dL
Non-HDL Cholesterol (Calc): 100 mg/dL (calc) (ref <130)
Triglycerides: 60 mg/dL (ref <150)

## 2017-10-29 LAB — COMPLETE METABOLIC PANEL WITH GFR
AG Ratio: 1.5 (calc) (ref 1.0–2.5)
ALKALINE PHOSPHATASE (APISO): 70 U/L (ref 40–115)
ALT: 34 U/L (ref 9–46)
AST: 21 U/L (ref 10–35)
Albumin: 4.1 g/dL (ref 3.6–5.1)
BUN: 21 mg/dL (ref 7–25)
CHLORIDE: 103 mmol/L (ref 98–110)
CO2: 29 mmol/L (ref 20–32)
Calcium: 9.2 mg/dL (ref 8.6–10.3)
Creat: 1.19 mg/dL (ref 0.70–1.25)
GFR, Est African American: 73 mL/min/{1.73_m2} (ref >=60)
GFR, Est Non African American: 63 mL/min/{1.73_m2} (ref >=60)
GLOBULIN: 2.7 g/dL (ref 1.9–3.7)
Glucose, Bld: 97 mg/dL (ref 65–99)
POTASSIUM: 4.7 mmol/L (ref 3.5–5.3)
SODIUM: 138 mmol/L (ref 135–146)
Total Bilirubin: 0.4 mg/dL (ref 0.2–1.2)
Total Protein: 6.8 g/dL (ref 6.1–8.1)

## 2017-10-29 LAB — RPR: RPR: NONREACTIVE

## 2017-10-29 LAB — HIV-1 RNA QUANT-NO REFLEX-BLD
HIV 1 RNA Quant: 47 copies/mL — ABNORMAL HIGH
HIV-1 RNA QUANT, LOG: 1.67 {Log_copies}/mL — AB

## 2017-11-13 ENCOUNTER — Ambulatory Visit (INDEPENDENT_AMBULATORY_CARE_PROVIDER_SITE_OTHER): Payer: Medicare Other | Admitting: Internal Medicine

## 2017-11-13 ENCOUNTER — Encounter: Payer: Self-pay | Admitting: Internal Medicine

## 2017-11-13 VITALS — BP 126/85 | HR 56 | Temp 98.1°F | Ht 70.0 in | Wt 156.0 lb

## 2017-11-13 DIAGNOSIS — I25708 Atherosclerosis of coronary artery bypass graft(s), unspecified, with other forms of angina pectoris: Secondary | ICD-10-CM | POA: Diagnosis not present

## 2017-11-13 DIAGNOSIS — B20 Human immunodeficiency virus [HIV] disease: Secondary | ICD-10-CM

## 2017-11-13 DIAGNOSIS — R42 Dizziness and giddiness: Secondary | ICD-10-CM | POA: Diagnosis not present

## 2017-11-13 DIAGNOSIS — Z5181 Encounter for therapeutic drug level monitoring: Secondary | ICD-10-CM | POA: Diagnosis not present

## 2017-11-13 DIAGNOSIS — Z113 Encounter for screening for infections with a predominantly sexual mode of transmission: Secondary | ICD-10-CM | POA: Diagnosis not present

## 2017-11-13 MED ORDER — BICTEGRAVIR-EMTRICITAB-TENOFOV 50-200-25 MG PO TABS: 1.0000 | ORAL_TABLET | Freq: Every day | ORAL | 11 refills | Status: DC

## 2017-11-13 NOTE — Assessment & Plan Note (Signed)
Creat good, no issues.

## 2017-11-13 NOTE — Assessment & Plan Note (Signed)
Screened negative 

## 2017-11-13 NOTE — Assessment & Plan Note (Addendum)
I discussed different options and benefits of switching to TAF and off of efavirenz and he is now agreeable to switch to [place].  Will check la bs in 4 weeks and follow up with me in 5 weeks.  Had a blip to 47 copies.  Discussed little concern for that.

## 2017-11-13 NOTE — Progress Notes (Signed)
   Subjective:    Patient ID: Edwin Padilla, male    DOB: Nov 27, 1951, 66 y.o.   MRN: [id number]  HPI Here for follow up of HIV Has been on Atripla and no issues.  No missed doses, no associated n/v/d. No grogginess.  Some recent issues with dizziness.  Has issues with increased expense and wondering about coverage.  Interested in changing medication. Has been working with pharmacy on different options of cost.  Does not qualify for assistance.  Concerned about BP and viral load blip.     Review of Systems  Constitutional: Negative for fatigue.  Gastrointestinal: Negative for diarrhea.  Skin: Negative for rash.  Neurological: Positive for dizziness. Negative for headaches.       Objective:   Physical Exam  Constitutional: He appears well-developed and well-nourished. No distress.  HENT:  Mouth/Throat: No oropharyngeal exudate.  Eyes: No scleral icterus.  Cardiovascular: Normal rate, regular rhythm and normal heart sounds.  No murmur heard. Pulmonary/Chest: Effort normal and breath sounds normal. No respiratory distress.  Lymphadenopathy:    He has no cervical adenopathy.  Skin: No rash noted.    SH: former smoker      Assessment & Plan:

## 2017-11-13 NOTE — Assessment & Plan Note (Signed)
This may be improved off of Atripla.  Will see next visit.

## 2017-12-12 ENCOUNTER — Other Ambulatory Visit: Payer: Medicare Other

## 2017-12-12 DIAGNOSIS — B20 Human immunodeficiency virus [HIV] disease: Secondary | ICD-10-CM | POA: Diagnosis not present

## 2017-12-13 LAB — COMPLETE METABOLIC PANEL WITH GFR
AG RATIO: 1.4 (calc) (ref 1.0–2.5)
ALBUMIN MSPROF: 4 g/dL (ref 3.6–5.1)
ALKALINE PHOSPHATASE (APISO): 69 U/L (ref 40–115)
ALT: 37 U/L (ref 9–46)
AST: 20 U/L (ref 10–35)
BILIRUBIN TOTAL: 0.4 mg/dL (ref 0.2–1.2)
BUN: 22 mg/dL (ref 7–25)
CHLORIDE: 103 mmol/L (ref 98–110)
CO2: 28 mmol/L (ref 20–32)
Calcium: 9.3 mg/dL (ref 8.6–10.3)
Creat: 1.11 mg/dL (ref 0.70–1.25)
GFR, Est African American: 80 mL/min/{1.73_m2} (ref >=60)
GFR, Est Non African American: 69 mL/min/{1.73_m2} (ref >=60)
GLUCOSE: 102 mg/dL — AB (ref 65–99)
Globulin: 2.8 g/dL (calc) (ref 1.9–3.7)
POTASSIUM: 4.8 mmol/L (ref 3.5–5.3)
Sodium: 137 mmol/L (ref 135–146)
Total Protein: 6.8 g/dL (ref 6.1–8.1)

## 2017-12-13 LAB — CBC WITH DIFFERENTIAL/PLATELET
BASOS ABS: 21 {cells}/uL (ref 0–200)
Basophils Relative: 0.3 %
EOS ABS: 228 {cells}/uL (ref 15–500)
Eosinophils Relative: 3.3 %
HEMATOCRIT: 39.6 % (ref 38.5–50.0)
HEMOGLOBIN: 13.5 g/dL (ref 13.2–17.1)
LYMPHS ABS: 2056 {cells}/uL (ref 850–3900)
MCH: 30.9 pg (ref 27.0–33.0)
MCHC: 34.1 g/dL (ref 32.0–36.0)
MCV: 90.6 fL (ref 80.0–100.0)
MPV: 10.5 fL (ref 7.5–12.5)
Monocytes Relative: 8.1 %
NEUTROS ABS: 4037 {cells}/uL (ref 1500–7800)
NEUTROS PCT: 58.5 %
Platelets: 274 10*3/uL (ref 140–400)
RBC: 4.37 10*6/uL (ref 4.20–5.80)
RDW: 13.1 % (ref 11.0–15.0)
Total Lymphocyte: 29.8 %
WBC: 6.9 10*3/uL (ref 3.8–10.8)
WBCMIX: 559 {cells}/uL (ref 200–950)

## 2017-12-13 LAB — T-HELPER CELL (CD4) - (RCID CLINIC ONLY)
CD4 % Helper T Cell: 29 % — ABNORMAL LOW (ref 33–55)
CD4 T CELL ABS: 590 /uL (ref 400–2700)

## 2017-12-14 ENCOUNTER — Other Ambulatory Visit: Payer: Self-pay

## 2017-12-14 LAB — HIV-1 RNA QUANT-NO REFLEX-BLD
HIV 1 RNA QUANT: NOT DETECTED {copies}/mL
HIV-1 RNA Quant, Log: <1.3 Log copies/mL

## 2017-12-14 MED ORDER — ALBUTEROL SULFATE HFA 108 (90 BASE) MCG/ACT IN AERS: 2.0000 | INHALATION_SPRAY | Freq: Four times a day (QID) | RESPIRATORY_TRACT | 1 refills | Status: DC | PRN

## 2017-12-17 ENCOUNTER — Encounter: Payer: Self-pay | Admitting: Internal Medicine

## 2017-12-18 ENCOUNTER — Encounter: Payer: Self-pay | Admitting: Internal Medicine

## 2017-12-18 ENCOUNTER — Ambulatory Visit (INDEPENDENT_AMBULATORY_CARE_PROVIDER_SITE_OTHER): Payer: Medicare Other | Admitting: Internal Medicine

## 2017-12-18 VITALS — BP 114/74 | HR 53 | Temp 98.1°F | Ht 70.0 in | Wt 160.0 lb

## 2017-12-18 DIAGNOSIS — Z5181 Encounter for therapeutic drug level monitoring: Secondary | ICD-10-CM

## 2017-12-18 DIAGNOSIS — B20 Human immunodeficiency virus [HIV] disease: Secondary | ICD-10-CM

## 2017-12-19 ENCOUNTER — Encounter: Payer: Self-pay | Admitting: Internal Medicine

## 2017-12-19 NOTE — Assessment & Plan Note (Signed)
Doing well now on Biktarvy and will stay on it and suppressed virus after a previous blip. rtc 6 months

## 2017-12-19 NOTE — Progress Notes (Signed)
   Subjective:    Patient ID: Edwin Padilla, male    DOB: 31-Oct-1951, 67 y.o.   MRN: [id number]  HPI Here for follow up of HIV Last visit he changed to [facility] after being on Atripla.  He did notice some side effects including sob, GI upset, insomnia.  SE now are starting to diminish and he does not feel he needs to change.  Otherwise no new issues. No associated rash. CD 4 590 and viral load < 20.     Review of Systems  Constitutional: Negative for fatigue.  Skin: Negative for rash.  Neurological: Negative for dizziness.       Objective:   Physical Exam  Constitutional: He appears well-developed and well-nourished. No distress.  Cardiovascular: Normal rate, regular rhythm and normal heart sounds.  No murmur heard. Pulmonary/Chest: Effort normal. No respiratory distress.  Skin: No rash noted.          Assessment & Plan:

## 2017-12-19 NOTE — Assessment & Plan Note (Signed)
No issues with creat, LFTs.   

## 2018-01-07 NOTE — Progress Notes (Signed)
Subjective:   Edwin Padilla is a 67 y.o. male who presents for an Initial Medicare Annual Wellness Visit.  Review of Systems  No ROS.  Medicare Wellness Visit. Additional risk factors are reflected in the social history.  Cardiac Risk Factors include: advanced age (>24men, >33 women);dyslipidemia;male gender;family history of premature cardiovascular disease   Sleep patterns: Sleeps 7 hours. Up to void x 2.  Home Safety/Smoke Alarms: Feels safe in home. Smoke alarms in place.  Living environment; residence and Firearm Safety: Lives alone in 1 story home.  Seat Belt Safety/Bike Helmet: Wears seat belt.    Male:   CCS-Colonoscopy 01/19/2011, normal.      PSA-  Lab Results  Component Value Date   PSA 0.43 04/27/2017   PSA 0.46 03/15/2016      Objective:    Today's Vitals   01/08/18 0812  BP: 118/64  Pulse: (!) 52  Resp: 18  Temp: 97.9 F (36.6 C)  TempSrc: Oral  SpO2: 96%  Weight: 164 lb 12.8 oz (74.8 kg)  Height: 5\' 10"  (1.778 m)   Body mass index is 23.65 kg/m.  Advanced Directives 01/08/2018 04/09/2013 04/04/2013  Does Patient Have a Medical Advance Directive? No Patient has advance directive, copy not in chart Patient has advance directive, copy not in chart  Type of Advance Directive - - Living will  Would patient like information on creating a medical advance directive? No - Patient declined - -    Current Medications (verified) Outpatient Encounter Medications as of 01/08/2018  Medication Sig  . albuterol (VENTOLIN HFA) 108 (90 Base) MCG/ACT inhaler Inhale 2 puffs into the lungs every 6 (six) hours as needed for wheezing or shortness of breath.  Marland Kitchen aspirin 81 MG chewable tablet Chew 81 mg by mouth daily.    Marland Kitchen atorvastatin (LIPITOR) 40 MG tablet Take 1 tablet (40 mg total) by mouth daily.  . bictegravir-emtricitabine-tenofovir AF (BIKTARVY) 50-200-25 MG TABS tablet Take 1 tablet by mouth daily.  . Multiple Vitamins-Minerals (MULTIVITAMIN PO) Take by mouth.  .  carbamide peroxide (DEBROX) 6.5 % otic solution 5 drops BID with cerumen impaction (Patient not taking: Reported on 01/08/2018)   No facility-administered encounter medications on file as of 01/08/2018.     Allergies (verified) Patient has no known allergies.   History: Past Medical History:  Diagnosis Date  . Allergy    Cat allergy  . Asthma   . Coronary atherosclerosis of unspecified type of vessel, native or graft    CABG 07/2003; annual cardiology f/u (Dr. Pernell Dupre).  Stable as of 02/2017 f/u--echo 03/2017 showed normal LV fxn---ok to d/c ACE-I per cardiologist.  . HIV infection ([place]) Dx 02/2002   ID clinic w/ [facility].  . Hyperlipidemia   . 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 Myocardial infarction ([place]) 2004  . Right inguinal hernia    s/p repair 03/2013  . Substance abuse ([place])    hx of   Past Surgical History:  Procedure Laterality Date  . CARDIOVASCULAR STRESS TEST  05/2016   No ischemia  . COLONOSCOPY  01/2011   Normal (recall 10 yrs)  . CORONARY ARTERY BYPASS GRAFT  2004  . HERNIA REPAIR     as infant  . INGUINAL HERNIA REPAIR Right 04/09/2013   Procedure: RIGHT HERNIA REPAIR INGUINAL ADULT;  Surgeon: Haywood Lasso, MD;  Location: [place];  Service: General;  Laterality: Right;  . TONSILLECTOMY    . TRANSTHORACIC ECHOCARDIOGRAM  03/19/2017  Normal   Family History  Problem Relation Age of Onset  . Stroke Mother 10  . Heart failure Father   . Heart disease Father   . Colon cancer Father 75  . Cancer Brother 31       lymphoma   Social History   Socioeconomic History  . Marital status: Single    Spouse name: None  . Number of children: None  . Years of education: None  . Highest education level: None  Social Needs  . Financial resource strain: None  . Food insecurity - worry: None  . Food insecurity - inability: None  . Transportation needs - medical: None  . Transportation needs - non-medical: None  Occupational  History  . None  Tobacco Use  . Smoking status: Former Smoker    Years: 35.00    Types: Cigarettes    Last attempt to quit: 12/12/2003    Years since quitting: 14.0  . Smokeless tobacco: Never Used  Substance and Sexual Activity  . Alcohol use: No    Comment: 2005  . Drug use: No    Comment: past history of crystal meth addiction -clean since 2005   . Sexual activity: Not Currently    Comment: 2005  Other Topics Concern  . None  Social History Narrative   Homosexual.   Never married.   Hx of methamph abuse; no IV drug use.  [place] remission drug program, no use since 2005.   Exercises regularly, says he eats a good diet.   Tob: quit 2004, 50 pack-yr hx.   No alcohol.   Tobacco Counseling Counseling given: Not Answered   Activities of Daily Living In your present state of health, do you have any difficulty performing the following activities: 01/08/2018  Hearing? N  Vision? N  Difficulty concentrating or making decisions? N  Walking or climbing stairs? N  Dressing or bathing? N  Doing errands, shopping? N  Preparing Food and eating ? N  Using the Toilet? N  In the past six months, have you accidently leaked urine? N  Do you have problems with loss of bowel control? N  Managing your Medications? N  Managing your Finances? N  Housekeeping or managing your Housekeeping? N  Some recent data might be hidden     Immunizations and Health Maintenance Immunization History  Administered Date(s) Administered  . Hepatitis A 01/29/2013  . Hepatitis A, Adult 08/20/2013  . Influenza Split 09/04/2012  . Influenza,inj,Quad PF,6+ Mos 09/27/2017  . Influenza-Unspecified 09/18/2013, 09/22/2015  . PPD Test 08/11/2009, 08/09/2011  . Pneumococcal Conjugate-13 03/15/2015  . Pneumococcal Polysaccharide-23 08/03/2011, 02/20/2017  . Tdap 09/07/2010  . Zoster 09/14/2014  . Zoster Recombinat (Shingrix) 09/12/2017, 09/16/2017   There are no preventive care reminders to display for this  patient.  Patient Care Team: Tammi Sou, MD as PCP - General (Family Medicine) Belva Crome, MD (Cardiology) Neldon Mc, MD (General Surgery) Garlan Fair, MD (Gastroenterology) Comer, Okey Regal, MD as Consulting Physician (Infectious Diseases) Belva Crome, MD as Consulting Physician (Cardiology) Druscilla Brownie, MD as Consulting Physician (Dermatology)  Indicate any recent Medical Services you may have received from other than [facility] providers in the past year (date may be approximate).    Assessment:   This is a routine wellness examination for Edwin Padilla.  Hearing/Vision screen Hearing Screening Comments: Able to hear conversational tones w/o difficulty. No issues reported.   Vision Screening Comments: Last exam > 1 year, with vision changes only. [employer]). Wears glasses.  Dietary issues and exercise activities discussed: Current Exercise Habits: Structured exercise class, Time (Minutes): 35, Frequency (Times/Week): 4, Weekly Exercise (Minutes/Week): 140, Exercise limited by: None identified   Diet (meal preparation, eat out, water intake, caffeinated beverages, dairy products, fruits and vegetables): Drinks water, coffee and protein shakes.   Breakfast: whole grain toast, peanut butter, jam Lunch: salad  Dinner: protein and vegetables.    Goals    . Patient Stated     Maintain current health      Depression Screen PHQ 2/9 Scores 01/08/2018 11/13/2017 11/06/2016 10/20/2015  PHQ - 2 Score 0 0 0 0    Fall Risk Fall Risk  01/08/2018 11/13/2017 11/06/2016 10/20/2015 08/11/2015  Falls in the past year? No No No No No    Cognitive Function:       Ad8 score reviewed for issues:  Issues making decisions: no  Less interest in hobbies / activities: no  Repeats questions, stories (family complaining): no  Trouble using ordinary gadgets (microwave, computer, phone): no  Forgets the month or year: no  Mismanaging finances:  no  Remembering appts: no  Daily problems with thinking and/or memory: no Ad8 score is=0     Screening Tests Health Maintenance  Topic Date Due  . TETANUS/TDAP  09/07/2020  . COLONOSCOPY  01/19/2021  . INFLUENZA VACCINE  Completed  . Hepatitis C Screening  Completed  . PNA vac Low Risk Adult  Completed       Plan:    Bring a copy of your living will and/or healthcare power of attorney to your next office visit.  Continue doing brain stimulating activities (puzzles, reading, adult coloring books, staying active) to keep memory sharp.    I have personally reviewed and noted the following in the patient's chart:   . Medical and social history . Use of alcohol, tobacco or illicit drugs  . Current medications and supplements . Functional ability and status . Nutritional status . Physical activity . Advanced directives . List of other physicians . Hospitalizations, surgeries, and ER visits in previous 12 months . Vitals . Screenings to include cognitive, depression, and falls . Referrals and appointments  In addition, I have reviewed and discussed with patient certain preventive protocols, quality metrics, and best practice recommendations. A written personalized care plan for preventive services as well as general preventive health recommendations were provided to patient.     Gerilyn Nestle, RN   01/08/2018    PCP Notes:  -Appt with PCP cancelled for today. Was scheduled for "F/U BP", was actually for SOB that has resolved. Pt states he thinks this was side effect of new med from ID provider. Pt states he will make appt with PCP if symptoms reoccur.    -Reports increased fatigue x 3 year after work out, has spoken to ID providers

## 2018-01-08 ENCOUNTER — Other Ambulatory Visit: Payer: Self-pay

## 2018-01-08 ENCOUNTER — Encounter: Payer: Self-pay | Admitting: Family Medicine

## 2018-01-08 ENCOUNTER — Ambulatory Visit (INDEPENDENT_AMBULATORY_CARE_PROVIDER_SITE_OTHER): Payer: Medicare Other

## 2018-01-08 ENCOUNTER — Ambulatory Visit: Payer: Medicare Other | Admitting: Family Medicine

## 2018-01-08 VITALS — BP 118/64 | HR 52 | Temp 97.9°F | Resp 18 | Ht 70.0 in | Wt 164.8 lb

## 2018-01-08 DIAGNOSIS — Z0000 Encounter for general adult medical examination without abnormal findings: Secondary | ICD-10-CM

## 2018-01-08 NOTE — Patient Instructions (Addendum)
Bring a copy of your living will and/or healthcare power of attorney to your next office visit.  Continue doing brain stimulating activities (puzzles, reading, adult coloring books, staying active) to keep memory sharp.    Health Maintenance, Male A healthy lifestyle and preventive care is important for your health and wellness. Ask your health care provider about what schedule of regular examinations is right for you. What should I know about weight and diet? Eat a Healthy Diet  Eat plenty of vegetables, fruits, whole grains, low-fat dairy products, and lean protein.  Do not eat a lot of foods high in solid fats, added sugars, or salt.  Maintain a Healthy Weight Regular exercise can help you achieve or maintain a healthy weight. You should:  Do at least 150 minutes of exercise each week. The exercise should increase your heart rate and make you sweat (moderate-intensity exercise).  Do strength-training exercises at least twice a week.  Watch Your Levels of Cholesterol and Blood Lipids  Have your blood tested for lipids and cholesterol every 5 years starting at 67 years of age. If you are at high risk for heart disease, you should start having your blood tested when you are 67 years old. You may need to have your cholesterol levels checked more often if: ? Your lipid or cholesterol levels are high. ? You are older than 67 years of age. ? You are at high risk for heart disease.  What should I know about cancer screening? Many types of cancers can be detected early and may often be prevented. Lung Cancer  You should be screened every year for lung cancer if: ? You are a current smoker who has smoked for at least 30 years. ? You are a former smoker who has quit within the past 15 years.  Talk to your health care provider about your screening options, when you should start screening, and how often you should be screened.  Colorectal Cancer  Routine colorectal cancer screening  usually begins at 67 years of age and should be repeated every 5-10 years until you are 67 years old. You may need to be screened more often if early forms of precancerous polyps or small growths are found. Your health care provider may recommend screening at an earlier age if you have risk factors for colon cancer.  Your health care provider may recommend using home test kits to check for hidden blood in the stool.  A small camera at the end of a tube can be used to examine your colon (sigmoidoscopy or colonoscopy). This checks for the earliest forms of colorectal cancer.  Prostate and Testicular Cancer  Depending on your age and overall health, your health care provider may do certain tests to screen for prostate and testicular cancer.  Talk to your health care provider about any symptoms or concerns you have about testicular or prostate cancer.  Skin Cancer  Check your skin from head to toe regularly.  Tell your health care provider about any new moles or changes in moles, especially if: ? There is a change in a mole's size, shape, or color. ? You have a mole that is larger than a pencil eraser.  Always use sunscreen. Apply sunscreen liberally and repeat throughout the day.  Protect yourself by wearing long sleeves, pants, a wide-brimmed hat, and sunglasses when outside.  What should I know about heart disease, diabetes, and high blood pressure?  If you are 18-39 years of age, have your blood pressure checked every   3-5 years. If you are 40 years of age or older, have your blood pressure checked every year. You should have your blood pressure measured twice-once when you are at a hospital or clinic, and once when you are not at a hospital or clinic. Record the average of the two measurements. To check your blood pressure when you are not at a hospital or clinic, you can use: ? An automated blood pressure machine at a pharmacy. ? A home blood pressure monitor.  Talk to your health care  provider about your target blood pressure.  If you are between 45-79 years old, ask your health care provider if you should take aspirin to prevent heart disease.  Have regular diabetes screenings by checking your fasting blood sugar level. ? If you are at a normal weight and have a low risk for diabetes, have this test once every three years after the age of 45. ? If you are overweight and have a high risk for diabetes, consider being tested at a younger age or more often.  A one-time screening for abdominal aortic aneurysm (AAA) by ultrasound is recommended for men aged 65-75 years who are current or former smokers. What should I know about preventing infection? Hepatitis B If you have a higher risk for hepatitis B, you should be screened for this virus. Talk with your health care provider to find out if you are at risk for hepatitis B infection. Hepatitis C Blood testing is recommended for:  Everyone born from 1945 through 1965.  Anyone with known risk factors for hepatitis C.  Sexually Transmitted Diseases (STDs)  You should be screened each year for STDs including gonorrhea and chlamydia if: ? You are sexually active and are younger than 67 years of age. ? You are older than 67 years of age and your health care provider tells you that you are at risk for this type of infection. ? Your sexual activity has changed since you were last screened and you are at an increased risk for chlamydia or gonorrhea. Ask your health care provider if you are at risk.  Talk with your health care provider about whether you are at high risk of being infected with HIV. Your health care provider may recommend a prescription medicine to help prevent HIV infection.  What else can I do?  Schedule regular health, dental, and eye exams.  Stay current with your vaccines (immunizations).  Do not use any tobacco products, such as cigarettes, chewing tobacco, and e-cigarettes. If you need help quitting, ask  your health care provider.  Limit alcohol intake to no more than 2 drinks per day. One drink equals 12 ounces of beer, 5 ounces of wine, or 1 ounces of hard liquor.  Do not use street drugs.  Do not share needles.  Ask your health care provider for help if you need support or information about quitting drugs.  Tell your health care provider if you often feel depressed.  Tell your health care provider if you have ever been abused or do not feel safe at home. This information is not intended to replace advice given to you by your health care provider. Make sure you discuss any questions you have with your health care provider. Document Released: 05/25/2008 Document Revised: 07/26/2016 Document Reviewed: 08/31/2015 Elsevier Interactive Patient Education  2018 Elsevier Inc.  

## 2018-01-08 NOTE — Progress Notes (Unsigned)
OFFICE VISIT  01/08/2018   CC: No chief complaint on file.    HPI:    Patient is a 67 y.o. Caucasian male who presents for follow up of his blood pressure. About 6 mo ago we d/c'd his lisinopril due to some symptoms he was having that were suspicious for transient low bp. He had a repeat echo by his cardiologist and his EF/LV function were normal so cardiologist said going off his ACE-I was fine.  Past Medical History:  Diagnosis Date  . Allergy    Cat allergy  . Asthma   . Coronary atherosclerosis of unspecified type of vessel, native or graft    CABG 07/2003; annual cardiology f/u (Dr. Pernell Dupre).  Stable as of 02/2017 f/u--echo to be done to assess LV function.  Consider d/c of ACE-I if LV function normal--per Dr. Tamala Julian.  Marland Kitchen HIV infection ([place]) Dx 02/2002   ID clinic w/ [facility].  . Hyperlipidemia   . 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 Myocardial infarction ([place]) 2004  . Right inguinal hernia    s/p repair 03/2013  . Substance abuse ([place])    hx of    Past Surgical History:  Procedure Laterality Date  . CARDIOVASCULAR STRESS TEST  05/2016   No ischemia  . COLONOSCOPY  01/2011   Normal (recall 10 yrs)  . CORONARY ARTERY BYPASS GRAFT  2004  . HERNIA REPAIR     as infant  . INGUINAL HERNIA REPAIR Right 04/09/2013   Procedure: RIGHT HERNIA REPAIR INGUINAL ADULT;  Surgeon: Haywood Lasso, MD;  Location: [place];  Service: General;  Laterality: Right;  . TONSILLECTOMY    . TRANSTHORACIC ECHOCARDIOGRAM  03/19/2017   Normal    Outpatient Medications Prior to Visit  Medication Sig Dispense Refill  . albuterol (VENTOLIN HFA) 108 (90 Base) MCG/ACT inhaler Inhale 2 puffs into the lungs every 6 (six) hours as needed for wheezing or shortness of breath. 1 Inhaler 1  . aspirin 81 MG chewable tablet Chew 81 mg by mouth daily.      Marland Kitchen atorvastatin (LIPITOR) 40 MG tablet Take 1 tablet (40 mg total) by mouth daily. 90 tablet 3  .  bictegravir-emtricitabine-tenofovir AF (BIKTARVY) 50-200-25 MG TABS tablet Take 1 tablet by mouth daily. 30 tablet 11  . carbamide peroxide (DEBROX) 6.5 % otic solution 5 drops BID with cerumen impaction 15 mL 2   No facility-administered medications prior to visit.     No Known Allergies  ROS As per HPI  PE: There were no vitals taken for this visit. ***  LABS:  ***  IMPRESSION AND PLAN:  No problem-specific Assessment & Plan notes found for this encounter.   FOLLOW UP: No Follow-up on file.

## 2018-01-10 NOTE — Progress Notes (Signed)
AWV reviewed and agree.  Signed:  Crissie Sickles, MD           01/10/2018

## 2018-02-28 ENCOUNTER — Encounter: Payer: Self-pay | Admitting: Internal Medicine

## 2018-02-28 ENCOUNTER — Other Ambulatory Visit: Payer: Self-pay | Admitting: Internal Medicine

## 2018-02-28 DIAGNOSIS — B20 Human immunodeficiency virus [HIV] disease: Secondary | ICD-10-CM

## 2018-03-25 ENCOUNTER — Telehealth: Payer: Self-pay | Admitting: Family Medicine

## 2018-03-25 MED ORDER — ATORVASTATIN CALCIUM 40 MG PO TABS: 40.0000 mg | ORAL_TABLET | Freq: Every day | ORAL | 1 refills | Status: DC

## 2018-03-25 NOTE — Telephone Encounter (Signed)
Copied from [place] [id number]. Topic: Quick Communication - Rx Refill/Question >> Mar 25, 2018 12:13 PM Yvette Rack wrote: Medication: atorvastatin (LIPITOR) 40 MG tablet Has the patient contacted their pharmacy? No because pt [facility] to [facility] for [facility] medicine he use to [place] the mail service (Agent: If no, request that the patient contact the pharmacy for the refill.) Preferred Pharmacy (with phone number or street name):   [id number] [place], [place] - [address] [phone] (Phone) [phone] (Fax)     Agent: Please be advised that RX refills may take up to 3 business days. We ask that you follow-up with your pharmacy.

## 2018-03-26 ENCOUNTER — Other Ambulatory Visit: Payer: Medicare Other

## 2018-03-26 DIAGNOSIS — B20 Human immunodeficiency virus [HIV] disease: Secondary | ICD-10-CM | POA: Diagnosis not present

## 2018-03-26 LAB — COMPLETE METABOLIC PANEL WITH GFR
AG Ratio: 1.6 (calc) (ref 1.0–2.5)
ALT: 32 U/L (ref 9–46)
AST: 23 U/L (ref 10–35)
Albumin: 4.2 g/dL (ref 3.6–5.1)
Alkaline phosphatase (APISO): 60 U/L (ref 40–115)
BUN: 18 mg/dL (ref 7–25)
CALCIUM: 9.5 mg/dL (ref 8.6–10.3)
CO2: 30 mmol/L (ref 20–32)
CREATININE: 1.09 mg/dL (ref 0.70–1.25)
Chloride: 105 mmol/L (ref 98–110)
GFR, EST AFRICAN AMERICAN: 82 mL/min/{1.73_m2} (ref >=60)
GFR, EST NON AFRICAN AMERICAN: 70 mL/min/{1.73_m2} (ref >=60)
GLUCOSE: 74 mg/dL (ref 65–99)
Globulin: 2.6 g/dL (calc) (ref 1.9–3.7)
Potassium: 4.4 mmol/L (ref 3.5–5.3)
Sodium: 140 mmol/L (ref 135–146)
TOTAL PROTEIN: 6.8 g/dL (ref 6.1–8.1)
Total Bilirubin: 0.4 mg/dL (ref 0.2–1.2)

## 2018-03-27 LAB — T-HELPER CELL (CD4) - (RCID CLINIC ONLY)
CD4 T CELL ABS: 720 /uL (ref 400–2700)
CD4 T CELL HELPER: 32 % — AB (ref 33–55)

## 2018-03-28 LAB — HIV-1 RNA QUANT-NO REFLEX-BLD
HIV 1 RNA QUANT: NOT DETECTED {copies}/mL
HIV-1 RNA Quant, Log: <1.3 Log copies/mL

## 2018-04-02 ENCOUNTER — Other Ambulatory Visit: Payer: Self-pay

## 2018-04-02 DIAGNOSIS — D485 Neoplasm of uncertain behavior of skin: Secondary | ICD-10-CM | POA: Diagnosis not present

## 2018-04-02 DIAGNOSIS — L820 Inflamed seborrheic keratosis: Secondary | ICD-10-CM | POA: Diagnosis not present

## 2018-06-04 ENCOUNTER — Other Ambulatory Visit: Payer: Medicare Other

## 2018-06-04 DIAGNOSIS — B20 Human immunodeficiency virus [HIV] disease: Secondary | ICD-10-CM | POA: Diagnosis not present

## 2018-06-05 LAB — T-HELPER CELL (CD4) - (RCID CLINIC ONLY)
CD4 % Helper T Cell: 35 % (ref 33–55)
CD4 T Cell Abs: 810 /uL (ref 400–2700)

## 2018-06-06 LAB — HIV-1 RNA QUANT-NO REFLEX-BLD
HIV 1 RNA Quant: <20 copies/mL
HIV-1 RNA QUANT, LOG: NOT DETECTED {Log_copies}/mL

## 2018-06-07 ENCOUNTER — Encounter: Payer: Self-pay | Admitting: Interventional Cardiology

## 2018-06-16 NOTE — Progress Notes (Signed)
Cardiology Office Note    Date:  06/17/2018   ID:  Edwin Padilla, DOB February 05, 1951, MRN [id number]  PCP:  Tammi Sou, MD  Cardiologist: Sinclair Grooms, MD   Chief Complaint  Patient presents with  . Coronary Artery Disease    History of Present Illness:  Edwin Padilla is a 67 y.o. male who presents for CAD, CABG with LIMA to LAD and SVG to diagonal 1, hyperlipidemia, untreated HIV infection.   He is doing great.  He denies chest discomfort and dyspnea.  It is a little more difficult to run as he did 10 years ago.  There is more fatigue.  Some leg pain related to the joints.  No difficulty sleeping.  Past Medical History:  Diagnosis Date  . Allergy    Cat allergy  . Asthma   . Coronary atherosclerosis of unspecified type of vessel, native or graft    CABG 07/2003; annual cardiology f/u (Dr. Pernell Dupre).  Stable as of 02/2017 f/u--echo 03/2017 showed normal LV fxn---ok to d/c ACE-I per cardiologist.  . HIV infection ([place]) Dx 02/2002   ID clinic w/ [facility].  . Hyperlipidemia   . 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 Myocardial infarction ([place]) 2004  . Right inguinal hernia    s/p repair 03/2013  . Substance abuse ([place])    hx of    Past Surgical History:  Procedure Laterality Date  . CARDIOVASCULAR STRESS TEST  05/2016   No ischemia  . COLONOSCOPY  01/2011   Normal (recall 10 yrs)  . CORONARY ARTERY BYPASS GRAFT  2004  . HERNIA REPAIR     as infant  . INGUINAL HERNIA REPAIR Right 04/09/2013   Procedure: RIGHT HERNIA REPAIR INGUINAL ADULT;  Surgeon: Haywood Lasso, MD;  Location: [place];  Service: General;  Laterality: Right;  . TONSILLECTOMY    . TRANSTHORACIC ECHOCARDIOGRAM  03/19/2017   Normal    Current Medications: Outpatient Medications Prior to Visit  Medication Sig Dispense Refill  . albuterol (VENTOLIN HFA) 108 (90 Base) MCG/ACT inhaler Inhale 2 puffs into the lungs every 6 (six) hours as needed for wheezing or shortness of  breath. 1 Inhaler 1  . aspirin 81 MG chewable tablet Chew 81 mg by mouth daily.      Marland Kitchen atorvastatin (LIPITOR) 40 MG tablet Take 1 tablet (40 mg total) by mouth daily. 90 tablet 1  . bictegravir-emtricitabine-tenofovir AF (BIKTARVY) 50-200-25 MG TABS tablet Take 1 tablet by mouth daily. 30 tablet 11  . Multiple Vitamins-Minerals (MULTIVITAMIN PO) Take 1 tablet by mouth daily.     . carbamide peroxide (DEBROX) 6.5 % otic solution 5 drops BID with cerumen impaction (Patient not taking: Reported on 01/08/2018) 15 mL 2   No facility-administered medications prior to visit.      Allergies:   Patient has no known allergies.   Social History   Socioeconomic History  . Marital status: Single    Spouse name: Not on file  . Number of children: Not on file  . Years of education: Not on file  . Highest education level: Not on file  Occupational History  . Not on file  Social Needs  . Financial resource strain: Not on file  . Food insecurity:    Worry: Not on file    Inability: Not on file  . Transportation needs:    Medical: Not on file    Non-medical: Not on file  Tobacco Use  .  Smoking status: Former Smoker    Years: 35.00    Types: Cigarettes    Last attempt to quit: 12/12/2003    Years since quitting: 14.5  . Smokeless tobacco: Never Used  Substance and Sexual Activity  . Alcohol use: No    Comment: 2005  . Drug use: No    Comment: past history of crystal meth addiction -clean since 2005   . Sexual activity: Not Currently    Comment: 2005  Lifestyle  . Physical activity:    Days per week: Not on file    Minutes per session: Not on file  . Stress: Not on file  Relationships  . Social connections:    Talks on phone: Not on file    Gets together: Not on file    Attends religious service: Not on file    Active member of club or organization: Not on file    Attends meetings of clubs or organizations: Not on file    Relationship status: Not on file  Other Topics Concern  . Not  on file  Social History Narrative   Homosexual.   Never married.   Hx of methamph abuse; no IV drug use.  [facility] remission drug program, no use since 2005.   Exercises regularly, says he eats a good diet.   Tob: quit 2004, 50 pack-yr hx.   No alcohol.     Family History:  The patient's family history includes Cancer (age of onset: 22) in his brother; Colon cancer (age of onset: 10) in his father; Heart disease in his father; Heart failure in his father; Stroke (age of onset: 84) in his mother.   ROS:   Please see the history of present illness.    Fatigue more than 5 or 10 years ago.  Still running 10-minute miles.  Runs 4 times per week.  Ran this morning. All other systems reviewed and are negative.   PHYSICAL EXAM:   VS:  BP 104/72   Pulse 75   Ht 5\' 10"  (1.778 m)   Wt 160 lb 12.8 oz (72.9 kg)   BMI 23.07 kg/m    GEN: Well nourished, well developed, in no acute distress  HEENT: normal  Neck: no JVD, carotid bruits, or masses Cardiac: RRR; no murmurs, rubs, or gallops,no edema  Respiratory:  clear to auscultation bilaterally, normal work of breathing GI: soft, nontender, nondistended, + BS MS: no deformity or atrophy  Skin: warm and dry, no rash Neuro:  Alert and Oriented x 3, Strength and sensation are intact Psych: euthymic mood, full affect  Wt Readings from Last 3 Encounters:  06/17/18 160 lb 12.8 oz (72.9 kg)  01/08/18 164 lb 12.8 oz (74.8 kg)  12/18/17 160 lb (72.6 kg)      Studies/Labs Reviewed:   EKG:  EKG sinus bradycardia with normal appearance.  Recent Labs: 12/12/2017: Hemoglobin 13.5; Platelets 274 03/26/2018: ALT 32; BUN 18; Creat 1.09; Potassium 4.4; Sodium 140   Lipid Panel    Component Value Date/Time   CHOL 164 10/26/2017 0949   CHOL 162 03/19/2017 0000   TRIG 60 10/26/2017 0949   HDL 64 10/26/2017 0949   HDL 58 03/19/2017 0000   CHOLHDL 2.6 10/26/2017 0949   VLDL 17 04/19/2016 0905   LDLCALC 85 10/26/2017 0949    Additional studies/  records that were reviewed today include:  No new data    ASSESSMENT:    1. Coronary artery disease of bypass graft of native heart with stable angina pectoris (  [place])   2. Other hyperlipidemia      PLAN:  In order of problems listed above:  1. Clinically stable.  Excellent exertional tolerance.  Discussed risk factor modification: LDL less than 70, blood pressure 130/80 mmHg, moderate aerobic activity which he significantly exceeds encouraged that he continue with his current program. 2. LDL target less than 70.  LDL is 85 when last evaluated earlier this year.  Consider adding Zetia.      Medication Adjustments/Labs and Tests Ordered: Current medicines are reviewed at length with the patient today.  Concerns regarding medicines are outlined above.  Medication changes, Labs and Tests ordered today are listed in the Patient Instructions below. Patient Instructions  Medication Instructions:  Your physician recommends that you continue on your current medications as directed. Please refer to the Current Medication list given to you today.   Labwork: None  Testing/Procedures: None  Follow-Up: Your physician wants you to follow-up in: 1 year with Dr. Tamala Julian.  You will receive a reminder letter in the mail two months in advance. If you don't receive a letter, please call our office to schedule the follow-up appointment.   Any Other Special Instructions Will Be Listed Below (If Applicable).     If you need a refill on your cardiac medications before your next appointment, please call your pharmacy.      Signed, Sinclair Grooms, MD  06/17/2018 9:35 AM    [facility] [address], [place], [place]  [address] Phone: [phone]; Fax: [phone]

## 2018-06-17 ENCOUNTER — Encounter: Payer: Self-pay | Admitting: Interventional Cardiology

## 2018-06-17 ENCOUNTER — Ambulatory Visit (INDEPENDENT_AMBULATORY_CARE_PROVIDER_SITE_OTHER): Payer: Medicare Other | Admitting: Interventional Cardiology

## 2018-06-17 VITALS — BP 104/72 | HR 75 | Ht 70.0 in | Wt 160.8 lb

## 2018-06-17 DIAGNOSIS — E7849 Other hyperlipidemia: Secondary | ICD-10-CM

## 2018-06-17 DIAGNOSIS — I25708 Atherosclerosis of coronary artery bypass graft(s), unspecified, with other forms of angina pectoris: Secondary | ICD-10-CM | POA: Diagnosis not present

## 2018-06-17 NOTE — Patient Instructions (Signed)

## 2018-06-19 ENCOUNTER — Ambulatory Visit (INDEPENDENT_AMBULATORY_CARE_PROVIDER_SITE_OTHER): Payer: Medicare Other | Admitting: Internal Medicine

## 2018-06-19 ENCOUNTER — Encounter: Payer: Self-pay | Admitting: Internal Medicine

## 2018-06-19 ENCOUNTER — Other Ambulatory Visit: Payer: Self-pay

## 2018-06-19 VITALS — BP 109/74 | HR 57 | Ht 70.0 in | Wt 162.0 lb

## 2018-06-19 DIAGNOSIS — R5383 Other fatigue: Secondary | ICD-10-CM | POA: Insufficient documentation

## 2018-06-19 DIAGNOSIS — R5382 Chronic fatigue, unspecified: Secondary | ICD-10-CM | POA: Diagnosis not present

## 2018-06-19 DIAGNOSIS — Z21 Asymptomatic human immunodeficiency virus [HIV] infection status: Secondary | ICD-10-CM | POA: Diagnosis present

## 2018-06-19 DIAGNOSIS — I25708 Atherosclerosis of coronary artery bypass graft(s), unspecified, with other forms of angina pectoris: Secondary | ICD-10-CM | POA: Diagnosis not present

## 2018-06-19 DIAGNOSIS — E7849 Other hyperlipidemia: Secondary | ICD-10-CM | POA: Diagnosis not present

## 2018-06-19 DIAGNOSIS — Z113 Encounter for screening for infections with a predominantly sexual mode of transmission: Secondary | ICD-10-CM | POA: Diagnosis not present

## 2018-06-19 MED ORDER — BICTEGRAVIR-EMTRICITAB-TENOFOV 50-200-25 MG PO TABS: 1.0000 | ORAL_TABLET | Freq: Every day | ORAL | 11 refills | Status: DC

## 2018-06-19 NOTE — Assessment & Plan Note (Signed)
Doing well, no changes.  Can rtc 1 year.

## 2018-06-19 NOTE — Progress Notes (Signed)
   Subjective:    Patient ID: Edwin Padilla, male    DOB: October 01, 1951, 67 y.o.   MRN: [id number]  HPI Here for follow up of HIV He continues on Biktarvy and no missed doses.  He did notice some side effects including mild GI upset though does not feel he needs to change.  Otherwise no new issues. No associated rash. CD 4 810 and viral load < 20.     Review of Systems  Constitutional: Positive for fatigue.  Skin: Negative for rash.  Neurological: Negative for dizziness.       Objective:   Physical Exam  Constitutional: He appears well-developed and well-nourished. No distress.  Cardiovascular: Normal rate, regular rhythm and normal heart sounds.  No murmur heard. Pulmonary/Chest: Effort normal. No respiratory distress.  Skin: No rash noted.          Assessment & Plan:

## 2018-06-19 NOTE — Assessment & Plan Note (Signed)
Has had thyroid checked, not anemic.  No glucose issues.

## 2018-06-27 DIAGNOSIS — L905 Scar conditions and fibrosis of skin: Secondary | ICD-10-CM | POA: Diagnosis not present

## 2018-09-16 ENCOUNTER — Other Ambulatory Visit: Payer: Self-pay | Admitting: Family Medicine

## 2018-10-10 DIAGNOSIS — Z23 Encounter for immunization: Secondary | ICD-10-CM | POA: Diagnosis not present

## 2018-10-28 ENCOUNTER — Ambulatory Visit: Payer: Self-pay

## 2018-10-28 NOTE — Telephone Encounter (Signed)
Noted  

## 2018-10-28 NOTE — Telephone Encounter (Signed)
Pt. Reports that for 2 years he has noticed increasing fatigue and "a mental fogginess." This weekend also had chills and joint pain, but those symptoms are gone. Feels like after exercise, it takes longer "to recover." States "maybe something neurological is going on." Requests an appointment later in the week. Appointment made for Thursday. Instructed if symptoms worsen to call back.  Reason for Disposition . [1] MODERATE weakness (i.e., interferes with work, school, normal activities) AND [2] persists > 3 days  Answer Assessment - Initial Assessment Questions 1. DESCRIPTION: "Describe how you are feeling."     Feels fatigued 2. SEVERITY: "How bad is it?"  "Can you stand and walk?"   - MILD - Feels weak or tired, but does not interfere with work, school or normal activities   - [place] to stand and walk; weakness interferes with work, school, or normal activities   - SEVERE - Unable to stand or walk     Mild 3. ONSET:  "When did the weakness begin?"     Going on x 2 years off and on 4. CAUSE: "What do you think is causing the weakness?"       Maybe neurological 5. MEDICINES: "Have you recently started a new medicine or had a change in the amount of a medicine?"     No Over the weekend had chills and joint pain - gone now 6. OTHER SYMPTOMS: "Do you have any other symptoms?" (e.g., chest pain, fever, cough, SOB, vomiting, diarrhea, bleeding, other areas of pain)     No 7. PREGNANCY: "Is there any chance you are pregnant?" "When was your last menstrual period?"     n/a  Protocols used: WEAKNESS (GENERALIZED) AND FATIGUE-A-AH

## 2018-10-28 NOTE — Telephone Encounter (Signed)
Pt has apt with Dr. Anitra Lauth on Thrusday Nov. 21st at 8:30am (30 min).

## 2018-10-31 ENCOUNTER — Ambulatory Visit: Payer: Medicare Other | Admitting: Family Medicine

## 2018-10-31 ENCOUNTER — Encounter: Payer: Self-pay | Admitting: Family Medicine

## 2018-10-31 ENCOUNTER — Ambulatory Visit (INDEPENDENT_AMBULATORY_CARE_PROVIDER_SITE_OTHER): Payer: Medicare Other | Admitting: Family Medicine

## 2018-10-31 VITALS — BP 99/65 | HR 57 | Temp 97.8°F | Resp 16 | Ht 70.0 in | Wt 161.1 lb

## 2018-10-31 DIAGNOSIS — R5382 Chronic fatigue, unspecified: Secondary | ICD-10-CM | POA: Diagnosis not present

## 2018-10-31 DIAGNOSIS — I25708 Atherosclerosis of coronary artery bypass graft(s), unspecified, with other forms of angina pectoris: Secondary | ICD-10-CM | POA: Diagnosis not present

## 2018-10-31 DIAGNOSIS — B349 Viral infection, unspecified: Secondary | ICD-10-CM

## 2018-10-31 NOTE — Progress Notes (Signed)
OFFICE VISIT  10/31/2018   CC:  Chief Complaint  Patient presents with  . Fatigue   HPI:    Patient is a 67 y.o. Caucasian male who presents for fatigue. A couple days ago he had rapid onset of chills, joint pain, stiff neck and HA, dizziness/off balance feeling, and VERY fatigued---all lasted 24h or so.  NO ST or cough.  No joint swelling. No n/v/d.  He did not check his temperature.  All sx's have resolved.  Complains of chronic (2 + years) body fatigue and excessive daytime sleepiness. Feels chronically fatigued and mental fogginess.  Says he doesn't think his exercise tolerance is as good as it should be. He runs and lifts weights every other day. He doesn't snore, has never been told that he has apneic events.  Hours of sleep avg 6-7.   Sleep does not feel restorative.  Has trouble getting going in the morning.  Once he gets on the treadmill in the morning he feels up.  Libido is not impaired.  He has no sexual partners. He is in a "life passage" lately in which he is experiencing some stress and short bouts of depressed mood.  No prolonged mood depression.  Most recent f/u with ID was 06/2018, at which time his CD4 count was 810 and his viral load was <20. His fatigue got worse with a change in HIV med regimen around 12/2017.  EKG normal 06/2018 at f/u with cardiologist, Dr. Tamala Julian. Pt denies CP, SOB, LE swelling, PND, or orthopnea.  No f/c nightsweats or abnl wt loss.  No rashes.  Past Medical History:  Diagnosis Date  . Allergy    Cat allergy  . Asthma   . Coronary atherosclerosis of unspecified type of vessel, native or graft    CABG 07/2003; annual cardiology f/u (Dr. Pernell Dupre).  Stable as of 02/2017 f/u--echo 03/2017 showed normal LV fxn---ok to d/c ACE-I per cardiologist.  . HIV infection ([place]) Dx 02/2002   ID clinic w/ [facility].  . Hyperlipidemia   . 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 Myocardial infarction ([place]) 2004  . Right inguinal hernia    s/p repair  03/2013  . Substance abuse ([place])    hx of    Past Surgical History:  Procedure Laterality Date  . CARDIOVASCULAR STRESS TEST  05/2016   No ischemia  . COLONOSCOPY  01/2011   Normal (recall 10 yrs)  . CORONARY ARTERY BYPASS GRAFT  2004  . HERNIA REPAIR     as infant  . INGUINAL HERNIA REPAIR Right 04/09/2013   Procedure: RIGHT HERNIA REPAIR INGUINAL ADULT;  Surgeon: Haywood Lasso, MD;  Location: [facility];  Service: General;  Laterality: Right;  . TONSILLECTOMY    . TRANSTHORACIC ECHOCARDIOGRAM  03/19/2017   Normal    Outpatient Medications Prior to Visit  Medication Sig Dispense Refill  . albuterol (VENTOLIN HFA) 108 (90 Base) MCG/ACT inhaler Inhale 2 puffs into the lungs every 6 (six) hours as needed for wheezing or shortness of breath. 1 Inhaler 1  . aspirin 81 MG chewable tablet Chew 81 mg by mouth daily.      Marland Kitchen atorvastatin (LIPITOR) 40 MG tablet Take 1 tablet (40 mg total) by mouth daily. NEEDS OFFICE VISIT FOR MORE REFILLS. 90 tablet 0  . bictegravir-emtricitabine-tenofovir AF (BIKTARVY) 50-200-25 MG TABS tablet Take 1 tablet by mouth daily. 30 tablet 11  . Multiple Vitamins-Minerals (MULTIVITAMIN PO) Take 1 tablet by mouth daily.  No facility-administered medications prior to visit.     No Known Allergies  ROS As per HPI  PE: Blood pressure 99/65, pulse (!) 57, temperature 97.8 F (36.6 C), temperature source Oral, resp. rate 16, height 5\' 10"  (1.778 m), weight 161 lb 2 oz (73.1 kg), SpO2 98 %. Body mass index is 23.12 kg/m.  Gen: Alert, well appearing.  Patient is oriented to person, place, time, and situation. AFFECT: pleasant, lucid thought and speech. DQQ:IWLN: no injection, icteris, swelling, or exudate.  EOMI, PERRLA. Mouth: lips without lesion/swelling.  Oral mucosa pink and moist. Oropharynx without erythema, exudate, or swelling.  Neck - No masses or thyromegaly or limitation in range of motion CV: RRR, no m/r/g.   LUNGS: CTA  bilat, nonlabored resps, good aeration in all lung fields. ABD: soft, NT, ND, BS normal.  No hepatospenomegaly or mass.  No bruits. EXT: no clubbing or cyanosis.  no edema.    LABS:  Lab Results  Component Value Date   TSH 1.48 04/27/2017   Lab Results  Component Value Date   WBC 6.9 12/12/2017   HGB 13.5 12/12/2017   HCT 39.6 12/12/2017   MCV 90.6 12/12/2017   PLT 274 12/12/2017   Lab Results  Component Value Date   CREATININE 1.09 03/26/2018   BUN 18 03/26/2018   NA 140 03/26/2018   K 4.4 03/26/2018   CL 105 03/26/2018   CO2 30 03/26/2018   Lab Results  Component Value Date   ALT 32 03/26/2018   AST 23 03/26/2018   ALKPHOS 76 03/19/2017   BILITOT 0.4 03/26/2018   Lab Results  Component Value Date   CHOL 164 10/26/2017   Lab Results  Component Value Date   HDL 64 10/26/2017   Lab Results  Component Value Date   LDLCALC 85 10/26/2017   Lab Results  Component Value Date   TRIG 60 10/26/2017   Lab Results  Component Value Date   CHOLHDL 2.6 10/26/2017   Lab Results  Component Value Date   PSA 0.43 04/27/2017   PSA 0.46 03/15/2016    IMPRESSION AND PLAN:  Chronic fatigue syndrome, with recent 24h viral syndrome in which the fatigue was profoundly worse. Discussed options with pt, decided on no repeat blood testing at this time. We will have him take a 3 week holiday OFF of his statin to see if this is affecting anything. He also thinks his HIV med is giving him some fatigue as side effect, and he wants to explore this further with his ID MD, Dr. Linus Salmons.  Spent 30 min with pt today, with >50% of this time spent in counseling and care coordination regarding the above problems.  An After Visit Summary was printed and given to the patient.  FOLLOW UP: Return for as needed.  Signed:  Crissie Sickles, MD           10/31/2018

## 2018-11-23 ENCOUNTER — Encounter: Payer: Self-pay | Admitting: Family Medicine

## 2018-11-25 NOTE — Telephone Encounter (Signed)
Please advise. Thanks.  

## 2018-12-11 DIAGNOSIS — M84352A Stress fracture, left femur, initial encounter for fracture: Secondary | ICD-10-CM

## 2018-12-11 HISTORY — DX: Stress fracture, left femur, initial encounter for fracture: M84.352A

## 2018-12-15 ENCOUNTER — Other Ambulatory Visit: Payer: Self-pay | Admitting: Family Medicine

## 2019-01-02 ENCOUNTER — Encounter: Payer: Self-pay | Admitting: Family Medicine

## 2019-01-02 MED ORDER — ALBUTEROL SULFATE HFA 108 (90 BASE) MCG/ACT IN AERS: 2.0000 | INHALATION_SPRAY | Freq: Four times a day (QID) | RESPIRATORY_TRACT | 1 refills | Status: DC | PRN

## 2019-01-07 ENCOUNTER — Encounter: Payer: Self-pay | Admitting: Family Medicine

## 2019-01-07 ENCOUNTER — Ambulatory Visit (INDEPENDENT_AMBULATORY_CARE_PROVIDER_SITE_OTHER): Payer: Medicare Other | Admitting: Family Medicine

## 2019-01-07 VITALS — BP 100/68 | Ht 70.0 in | Wt 157.0 lb

## 2019-01-07 DIAGNOSIS — M25562 Pain in left knee: Secondary | ICD-10-CM

## 2019-01-07 DIAGNOSIS — G8929 Other chronic pain: Secondary | ICD-10-CM

## 2019-01-07 DIAGNOSIS — M25561 Pain in right knee: Secondary | ICD-10-CM

## 2019-01-07 NOTE — Progress Notes (Signed)
PCP: Tammi Sou, MD  Subjective:   HPI: Patient is a 68 y.o. male here for bilateral knee pain. Edwin Padilla has been experiencing 2 years of intermittent bilateral or unilateral knee pain. He describes this as a 0-4/10 soreness of his anteriomedial and anterolateral knee bilaterally. The pain can be in either or both knees at times. He typically has morning soreness that will improve as he move in the morning. He has some intermittent pain with running, but usually feels okay afterwards. He runs about 12 miles per week. He has not tried any interventions as the pain has not been severe enough.   No skin changes, numbness.  Past Medical History:  Diagnosis Date  . Allergy    Cat allergy  . Asthma   . Coronary atherosclerosis of unspecified type of vessel, native or graft    CABG 07/2003; annual cardiology f/u (Dr. Pernell Dupre).  Stable as of 02/2017 f/u--echo 03/2017 showed normal LV fxn---ok to d/c ACE-I per cardiologist.  . HIV infection ([place]) Dx 02/2002   ID clinic w/ [facility].  . Hyperlipidemia   . 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 Myocardial infarction ([place]) 2004  . Right inguinal hernia    s/p repair 03/2013  . Substance abuse ([place])    hx of    Current Outpatient Medications on File Prior to Visit  Medication Sig Dispense Refill  . albuterol (VENTOLIN HFA) 108 (90 Base) MCG/ACT inhaler Inhale 2 puffs into the lungs every 6 (six) hours as needed for wheezing or shortness of breath. 1 Inhaler 1  . aspirin 81 MG chewable tablet Chew 81 mg by mouth daily.      Marland Kitchen atorvastatin (LIPITOR) 40 MG tablet TAKE 1 TABLET (40 MG TOTAL) BY MOUTH DAILY. NEEDS OFFICE VISIT FOR MORE REFILLS. 90 tablet 1  . bictegravir-emtricitabine-tenofovir AF (BIKTARVY) 50-200-25 MG TABS tablet Take 1 tablet by mouth daily. 30 tablet 11   No current facility-administered medications on file prior to visit.     Past Surgical History:  Procedure Laterality Date  . CARDIOVASCULAR STRESS TEST  05/2016    No ischemia  . COLONOSCOPY  01/2011   Normal (recall 10 yrs)  . CORONARY ARTERY BYPASS GRAFT  2004  . HERNIA REPAIR     as infant  . INGUINAL HERNIA REPAIR Right 04/09/2013   Procedure: RIGHT HERNIA REPAIR INGUINAL ADULT;  Surgeon: Haywood Lasso, MD;  Location: [facility];  Service: General;  Laterality: Right;  . TONSILLECTOMY    . TRANSTHORACIC ECHOCARDIOGRAM  03/19/2017   Normal    No Known Allergies  Social History   Socioeconomic History  . Marital status: Single    Spouse name: Not on file  . Number of children: Not on file  . Years of education: Not on file  . Highest education level: Not on file  Occupational History  . Not on file  Social Needs  . Financial resource strain: Not on file  . Food insecurity:    Worry: Not on file    Inability: Not on file  . Transportation needs:    Medical: Not on file    Non-medical: Not on file  Tobacco Use  . Smoking status: Former Smoker    Years: 35.00    Types: Cigarettes    Last attempt to quit: 12/12/2003    Years since quitting: 15.0  . Smokeless tobacco: Never Used  Substance and Sexual Activity  . Alcohol use: No  Comment: 2005  . Drug use: No    Comment: past history of crystal meth addiction -clean since 2005   . Sexual activity: Not Currently    Comment: 2005  Lifestyle  . Physical activity:    Days per week: Not on file    Minutes per session: Not on file  . Stress: Not on file  Relationships  . Social connections:    Talks on phone: Not on file    Gets together: Not on file    Attends religious service: Not on file    Active member of club or organization: Not on file    Attends meetings of clubs or organizations: Not on file    Relationship status: Not on file  . Intimate partner violence:    Fear of current or ex partner: Not on file    Emotionally abused: Not on file    Physically abused: Not on file    Forced sexual activity: Not on file  Other Topics Concern  . Not on  file  Social History Narrative   Homosexual.   Never married.   Hx of methamph abuse; no IV drug use.  [facility] remission drug program, no use since 2005.   Exercises regularly, says he eats a good diet.   Tob: quit 2004, 50 pack-yr hx.   No alcohol.    Family History  Problem Relation Age of Onset  . Stroke Mother 79  . Heart failure Father   . Heart disease Father   . Colon cancer Father 49  . Cancer Brother 61       lymphoma    BP 100/68   Ht 5\' 10"  (1.778 m)   Wt 157 lb (71.2 kg)   BMI 22.53 kg/m   Review of Systems: See HPI above.     Objective:  Physical Exam:  Gen: awake, alert, NAD, comfortable in exam room Pulm: breathing unlabored  L Knee: - Inspection: no gross deformity. No swelling/effusion, erythema or bruising. Skin intact - Palpation: no TTP - ROM: full active ROM with flexion and extension in knee and hip - Strength: 5/5 strength - Neuro/vasc: NV intact - Special Tests: - LIGAMENTS: negative anterior and posterior drawer, negative Lachman's, no MCL or LCL laxity  - MENISCUS: negative McMurray's - PF JOINT: nml patellar mobility bilaterally.  negative patellar grind  R Knee: - Inspection: no gross deformity. No swelling/effusion, erythema or bruising. Skin intact - Palpation: no TTP - ROM: full active ROM with flexion and extension in knee and hip - Strength: 5/5 strength - Neuro/vasc: NV intact - Special Tests: - LIGAMENTS: negative anterior and posterior drawer, negative Lachman's, no MCL or LCL laxity  - MENISCUS: negative McMurray's, negative Thessaly  - PF JOINT: nml patellar mobility bilaterally.  negative patellar grind   Assessment & Plan:  1. Bilateral Knee Osteoarthritis: Pateint's presentation is consistent with pain from primary osteoarthritis of the knees. He can continue to run as this should be protective. He can treat his pain as need withed OTC pain medications. X-rays discussed, but deferred as these would not change his  treatment. - OTC pain relievers PRN

## 2019-01-07 NOTE — Patient Instructions (Signed)
Your pain is due to arthritis. These are the different medications you can take for this if needed: Tylenol 500mg  1-2 tabs three times a day for pain. Capsaicin, aspercreme, or biofreeze topically up to four times a day may also help with pain. Some supplements that may help for arthritis: Boswellia extract, curcumin, pycnogenol Aleve 1-2 tabs twice a day with food Cortisone injections are an option for severe pain. If cortisone injections do not help, there are different types of shots that may help but they take longer to take effect. It's important that you continue to stay active. Straight leg raises, knee extensions 3 sets of 10 once a day (add ankle weight if these become too easy). Consider physical therapy to strengthen muscles around the joint that hurts to take pressure off of the joint itself. Shoe inserts with good arch support may be helpful. Heat or ice 15 minutes at a time 3-4 times a day as needed to help with pain. Continue running as you have been. Follow up with me in as needed. Consider x-rays as we discussed also.

## 2019-01-08 ENCOUNTER — Encounter: Payer: Self-pay | Admitting: Family Medicine

## 2019-06-09 ENCOUNTER — Other Ambulatory Visit: Payer: Medicare Other

## 2019-06-09 ENCOUNTER — Other Ambulatory Visit: Payer: Self-pay

## 2019-06-09 ENCOUNTER — Other Ambulatory Visit (HOSPITAL_COMMUNITY)
Admission: RE | Admit: 2019-06-09 | Discharge: 2019-06-09 | Disposition: A | Discharge status: Home / Self Care | Payer: Medicare Other | Source: Ambulatory Visit | Attending: Internal Medicine | Admitting: Internal Medicine

## 2019-06-09 DIAGNOSIS — Z21 Asymptomatic human immunodeficiency virus [HIV] infection status: Secondary | ICD-10-CM | POA: Diagnosis not present

## 2019-06-09 DIAGNOSIS — Z113 Encounter for screening for infections with a predominantly sexual mode of transmission: Secondary | ICD-10-CM

## 2019-06-09 DIAGNOSIS — E7849 Other hyperlipidemia: Secondary | ICD-10-CM | POA: Diagnosis not present

## 2019-06-10 LAB — URINE CYTOLOGY ANCILLARY ONLY
Chlamydia: NEGATIVE
Neisseria Gonorrhea: NEGATIVE

## 2019-06-10 LAB — T-HELPER CELL (CD4) - (RCID CLINIC ONLY)
CD4 % Helper T Cell: 32 % — ABNORMAL LOW (ref 33–65)
CD4 T Cell Abs: 607 /uL (ref 400–1790)

## 2019-06-17 LAB — CBC WITH DIFFERENTIAL/PLATELET
Absolute Monocytes: 549 cells/uL (ref 200–950)
Basophils Absolute: 18 cells/uL (ref 0–200)
Basophils Relative: 0.3 %
Eosinophils Absolute: 317 cells/uL (ref 15–500)
Eosinophils Relative: 5.2 %
HCT: 43 % (ref 38.5–50.0)
Hemoglobin: 14.6 g/dL (ref 13.2–17.1)
Lymphs Abs: 1909 cells/uL (ref 850–3900)
MCH: 30.9 pg (ref 27.0–33.0)
MCHC: 34 g/dL (ref 32.0–36.0)
MCV: 90.9 fL (ref 80.0–100.0)
MPV: 10.8 fL (ref 7.5–12.5)
Monocytes Relative: 9 %
Neutro Abs: 3306 cells/uL (ref 1500–7800)
Neutrophils Relative %: 54.2 %
Platelets: 261 10*3/uL (ref 140–400)
RBC: 4.73 10*6/uL (ref 4.20–5.80)
RDW: 13.7 % (ref 11.0–15.0)
Total Lymphocyte: 31.3 %
WBC: 6.1 10*3/uL (ref 3.8–10.8)

## 2019-06-17 LAB — COMPLETE METABOLIC PANEL WITH GFR
AG Ratio: 1.4 (calc) (ref 1.0–2.5)
ALT: 32 U/L (ref 9–46)
AST: 20 U/L (ref 10–35)
Albumin: 4.2 g/dL (ref 3.6–5.1)
Alkaline phosphatase (APISO): 54 U/L (ref 35–144)
BUN: 20 mg/dL (ref 7–25)
CO2: 29 mmol/L (ref 20–32)
Calcium: 9.7 mg/dL (ref 8.6–10.3)
Chloride: 107 mmol/L (ref 98–110)
Creat: 1.16 mg/dL (ref 0.70–1.25)
GFR, Est African American: 75 mL/min/{1.73_m2} (ref >=60)
GFR, Est Non African American: 65 mL/min/{1.73_m2} (ref >=60)
Globulin: 3 g/dL (calc) (ref 1.9–3.7)
Glucose, Bld: 104 mg/dL — ABNORMAL HIGH (ref 65–99)
Potassium: 4.5 mmol/L (ref 3.5–5.3)
Sodium: 139 mmol/L (ref 135–146)
Total Bilirubin: 0.6 mg/dL (ref 0.2–1.2)
Total Protein: 7.2 g/dL (ref 6.1–8.1)

## 2019-06-17 LAB — HIV-1 RNA QUANT-NO REFLEX-BLD
HIV 1 RNA Quant: <20 copies/mL
HIV-1 RNA Quant, Log: <1.3 Log copies/mL

## 2019-06-17 LAB — LIPID PANEL
Cholesterol: 145 mg/dL (ref <200)
HDL: 44 mg/dL (ref >=40)
LDL Cholesterol (Calc): 74 mg/dL (calc)
Non-HDL Cholesterol (Calc): 101 mg/dL (calc) (ref <130)
Total CHOL/HDL Ratio: 3.3 (calc) (ref <5.0)
Triglycerides: 178 mg/dL — ABNORMAL HIGH (ref <150)

## 2019-06-17 LAB — RPR: RPR Ser Ql: NONREACTIVE

## 2019-06-23 ENCOUNTER — Other Ambulatory Visit: Payer: Self-pay

## 2019-06-23 ENCOUNTER — Encounter: Payer: Self-pay | Admitting: Internal Medicine

## 2019-06-23 ENCOUNTER — Ambulatory Visit (INDEPENDENT_AMBULATORY_CARE_PROVIDER_SITE_OTHER): Payer: Medicare Other | Admitting: Internal Medicine

## 2019-06-23 ENCOUNTER — Ambulatory Visit (INDEPENDENT_AMBULATORY_CARE_PROVIDER_SITE_OTHER): Payer: Medicare Other | Admitting: Family Medicine

## 2019-06-23 VITALS — BP 120/70 | Ht 70.0 in | Wt 159.0 lb

## 2019-06-23 VITALS — BP 132/80 | HR 60 | Temp 98.1°F | Wt 164.0 lb

## 2019-06-23 DIAGNOSIS — M25552 Pain in left hip: Secondary | ICD-10-CM

## 2019-06-23 DIAGNOSIS — E7849 Other hyperlipidemia: Secondary | ICD-10-CM | POA: Diagnosis not present

## 2019-06-23 DIAGNOSIS — Z113 Encounter for screening for infections with a predominantly sexual mode of transmission: Secondary | ICD-10-CM

## 2019-06-23 DIAGNOSIS — Z5181 Encounter for therapeutic drug level monitoring: Secondary | ICD-10-CM | POA: Diagnosis not present

## 2019-06-23 DIAGNOSIS — Z21 Asymptomatic human immunodeficiency virus [HIV] infection status: Secondary | ICD-10-CM | POA: Diagnosis not present

## 2019-06-23 NOTE — Progress Notes (Signed)
   Subjective:    Patient ID: Edwin Padilla, male    DOB: 11-Nov-1951, 68 y.o.   MRN: [id number]  HPI Here for follow up of HIV He continues on Biktarvy and no missed doses.  No side effects and pleased with the regimen.   No new issues. No associated rash. CD4 607 and viral load < 20.  Normal creat.     Review of Systems  Constitutional: Negative for fatigue.  Gastrointestinal: Negative for diarrhea.  Skin: Negative for rash.  Neurological: Negative for dizziness.       Objective:   Physical Exam  Constitutional: He appears well-developed and well-nourished. No distress.  Cardiovascular: Normal rate, regular rhythm and normal heart sounds.  No murmur heard. Pulmonary/Chest: Effort normal. No respiratory distress.  Skin: No rash noted.   SH: former smoker       Assessment & Plan:

## 2019-06-23 NOTE — Assessment & Plan Note (Signed)
Creat, LFTs wnl.  

## 2019-06-23 NOTE — Assessment & Plan Note (Signed)
Doing well and no issues.  RTC 1 year

## 2019-06-23 NOTE — Assessment & Plan Note (Signed)
On a statin and monitored by his PCP

## 2019-06-23 NOTE — Patient Instructions (Signed)
You have a stress fracture of your left hip. We talked about the two directions you can take with this: imaging vs presumptively treating. No running, elliptical, jumping, or cutting activities. If it does not cause pain you can do light swimming, cycling with low resistance - many doctors advocate a 2 week period of rest from your last time running. Tylenol, ibuprofen as needed. Follow up with me in 6 weeks but call me sooner if you're having problems.

## 2019-06-23 NOTE — Assessment & Plan Note (Signed)
Screened negative 

## 2019-06-24 ENCOUNTER — Encounter: Payer: Self-pay | Admitting: Family Medicine

## 2019-06-24 NOTE — Progress Notes (Signed)
PCP: Tammi Sou, MD  Subjective:   HPI: Patient is a 68 y.o. male here for left hip pain.  Patient reports he's recently increased his running from 3 times a week about 3 miles at a time to 6 days a week. About 3 weeks ago he developed pain in left groin area after running. No pain during the run but ended up resting for about 20 days. He went back to running but developed same pain here. Pain is 1/10 and doesn't bother him with walking, a soreness. Some radiation down anterior thigh. No back pain. No numbness/tingling. No bowel/bladder dysfunction. No history of stress fracture.  Past Medical History:  Diagnosis Date  . Allergy    Cat allergy  . Asthma   . Bilateral primary osteoarthritis of knee   . Coronary atherosclerosis of unspecified type of vessel, native or graft    CABG 07/2003; annual cardiology f/u (Dr. Pernell Dupre).  Stable as of 02/2017 f/u--echo 03/2017 showed normal LV fxn---ok to d/c ACE-I per cardiologist.  . HIV infection ([place]) Dx 02/2002   ID clinic w/ [facility].  . Hyperlipidemia   . 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 Myocardial infarction ([place]) 2004  . Right inguinal hernia    s/p repair 03/2013  . Substance abuse ([place])    hx of    Current Outpatient Medications on File Prior to Visit  Medication Sig Dispense Refill  . albuterol (VENTOLIN HFA) 108 (90 Base) MCG/ACT inhaler Inhale 2 puffs into the lungs every 6 (six) hours as needed for wheezing or shortness of breath. 1 Inhaler 1  . aspirin 81 MG chewable tablet Chew 81 mg by mouth daily.      Marland Kitchen atorvastatin (LIPITOR) 40 MG tablet TAKE 1 TABLET (40 MG TOTAL) BY MOUTH DAILY. NEEDS OFFICE VISIT FOR MORE REFILLS. 90 tablet 1  . bictegravir-emtricitabine-tenofovir AF (BIKTARVY) 50-200-25 MG TABS tablet Take 1 tablet by mouth daily. 30 tablet 11   No current facility-administered medications on file prior to visit.     Past Surgical History:  Procedure Laterality Date  . CARDIOVASCULAR  STRESS TEST  05/2016   No ischemia  . COLONOSCOPY  01/2011   Normal (recall 10 yrs)  . CORONARY ARTERY BYPASS GRAFT  2004  . HERNIA REPAIR     as infant  . INGUINAL HERNIA REPAIR Right 04/09/2013   Procedure: RIGHT HERNIA REPAIR INGUINAL ADULT;  Surgeon: Haywood Lasso, MD;  Location: [facility];  Service: General;  Laterality: Right;  . TONSILLECTOMY    . TRANSTHORACIC ECHOCARDIOGRAM  03/19/2017   Normal    No Known Allergies  Social History   Socioeconomic History  . Marital status: Single    Spouse name: Not on file  . Number of children: Not on file  . Years of education: Not on file  . Highest education level: Not on file  Occupational History  . Not on file  Social Needs  . Financial resource strain: Not on file  . Food insecurity    Worry: Not on file    Inability: Not on file  . Transportation needs    Medical: Not on file    Non-medical: Not on file  Tobacco Use  . Smoking status: Former Smoker    Years: 35.00    Types: Cigarettes    Quit date: 12/12/2003    Years since quitting: 15.5  . Smokeless tobacco: Never Used  Substance and Sexual Activity  . Alcohol use: No  Comment: 2005  . Drug use: No    Comment: past history of crystal meth addiction -clean since 2005   . Sexual activity: Not Currently    Comment: 2005  Lifestyle  . Physical activity    Days per week: Not on file    Minutes per session: Not on file  . Stress: Not on file  Relationships  . Social Herbalist on phone: Not on file    Gets together: Not on file    Attends religious service: Not on file    Active member of club or organization: Not on file    Attends meetings of clubs or organizations: Not on file    Relationship status: Not on file  . Intimate partner violence    Fear of current or ex partner: Not on file    Emotionally abused: Not on file    Physically abused: Not on file    Forced sexual activity: Not on file  Other Topics Concern  . Not  on file  Social History Narrative   Homosexual.   Never married.   Hx of methamph abuse; no IV drug use.  [facility] remission drug program, no use since 2005.   Exercises regularly, says he eats a good diet.   Tob: quit 2004, 50 pack-yr hx.   No alcohol.    Family History  Problem Relation Age of Onset  . Stroke Mother 31  . Heart failure Father   . Heart disease Father   . Colon cancer Father 4  . Cancer Brother 58       lymphoma    BP 120/70   Ht 5\' 10"  (1.778 m)   Wt 159 lb (72.1 kg)   BMI 22.81 kg/m   Review of Systems: See HPI above.     Objective:  Physical Exam:  Gen: NAD, comfortable in exam room  Back: No gross deformity, scoliosis. TTP .  No midline or bony TTP. FROM. Strength LEs 5/5 all muscle groups.   2+ MSRs in patellar and achilles tendons, equal bilaterally. Negative SLRs. Sensation intact to light touch bilaterally.  Left hip: No deformity. FROM with 5/5 strength. No tenderness to palpation. NVI distally. Negative fulcrum. Positive hop test. Negative logroll bilateral hips Negative fabers and piriformis stretches. Minimal discomfort with Fadir.   Assessment & Plan:  1. Left hip pain - consistent with stress fracture of left hip.  Symptoms currently mild, doesn't bother at rest or with walking.  We discussed presumptive treatment vs imaging - he would like to presumptively treat.  No weight bearing exercise, jumping, cutting for next 6 weeks.  Cycling low resistance, swimming only if not painful and does not worsen pain but advised to do so slowly.  Tylenol, ibuprofen only if needed.  F/u in 6 weeks - advised to expect 12 weeks to fully heal.

## 2019-07-07 ENCOUNTER — Other Ambulatory Visit: Payer: Self-pay

## 2019-07-07 ENCOUNTER — Ambulatory Visit: Payer: Medicare Other | Admitting: Family Medicine

## 2019-07-09 ENCOUNTER — Other Ambulatory Visit: Payer: Self-pay | Admitting: Family Medicine

## 2019-07-09 NOTE — Telephone Encounter (Signed)
Called patient about medication (lipitor). Patient has not been seen in our office since 10/31/18. Patient will need to come in, we can send enough medication until appointment. We can do a VV if he is concerned about coming into the office

## 2019-07-13 ENCOUNTER — Other Ambulatory Visit: Payer: Self-pay | Admitting: Internal Medicine

## 2019-07-16 ENCOUNTER — Encounter: Payer: Self-pay | Admitting: Family Medicine

## 2019-07-16 ENCOUNTER — Other Ambulatory Visit: Payer: Self-pay

## 2019-07-16 ENCOUNTER — Ambulatory Visit (INDEPENDENT_AMBULATORY_CARE_PROVIDER_SITE_OTHER): Payer: Medicare Other | Admitting: Family Medicine

## 2019-07-16 ENCOUNTER — Other Ambulatory Visit: Payer: Self-pay | Admitting: Family Medicine

## 2019-07-16 VITALS — Temp 97.8°F | Wt 158.0 lb

## 2019-07-16 DIAGNOSIS — E7800 Pure hypercholesterolemia, unspecified: Secondary | ICD-10-CM | POA: Diagnosis not present

## 2019-07-16 MED ORDER — ATORVASTATIN CALCIUM 40 MG PO TABS: 40.0000 mg | ORAL_TABLET | Freq: Every day | ORAL | 3 refills | Status: DC

## 2019-07-16 NOTE — Progress Notes (Signed)
Virtual Visit via Video Note  I connected with pt on 07/16/19 at  3:30 PM EDT by a video enabled telemedicine application and verified that I am speaking with the correct person using two identifiers.  Location patient: home Location provider:work or home office Persons participating in the virtual visit: patient, provider  I discussed the limitations of evaluation and management by telemedicine and the availability of in person appointments. The patient expressed understanding and agreed to proceed.  Telemedicine visit is a necessity given the COVID-19 restrictions in place at the current time.  HPI: 68 y/o WM being seen today for f/u hypercholesterolemia. Tolerating med well. Eats healthy diet, exercises almost daily. Recent L hip stress fracture from lots of running. Now using a stationary bike.  No new complaints. Has albut inhaler in case he gets wheezing (primarily triggered by cats) but he has not had to use it in 10 yrs or so.  Feels better having it on hand though.  ROS: See pertinent positives and negatives per HPI.  Past Medical History:  Diagnosis Date  . Allergy    Cat allergy  . Asthma   . Bilateral primary osteoarthritis of knee   . Coronary atherosclerosis of unspecified type of vessel, native or graft    CABG 07/2003; annual cardiology f/u (Dr. Pernell Dupre).  Stable as of 02/2017 f/u--echo 03/2017 showed normal LV fxn---ok to d/c ACE-I per cardiologist.  . HIV infection ([place]) Dx 02/2002   ID clinic w/ [facility].  . Hyperlipidemia   . 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 Myocardial infarction ([place]) 2004  . Right inguinal hernia    s/p repair 03/2013  . Substance abuse ([place])    hx of    Past Surgical History:  Procedure Laterality Date  . CARDIOVASCULAR STRESS TEST  05/2016   No ischemia  . COLONOSCOPY  01/2011   Normal (recall 10 yrs)  . CORONARY ARTERY BYPASS GRAFT  2004  . HERNIA REPAIR     as infant  . INGUINAL HERNIA REPAIR Right 04/09/2013   Procedure: RIGHT HERNIA REPAIR INGUINAL ADULT;  Surgeon: Haywood Lasso, MD;  Location: [facility];  Service: General;  Laterality: Right;  . TONSILLECTOMY    . TRANSTHORACIC ECHOCARDIOGRAM  03/19/2017   Normal    Family History  Problem Relation Age of Onset  . Stroke Mother 6  . Heart failure Father   . Heart disease Father   . Colon cancer Father 37  . Cancer Brother 42       lymphoma    SOCIAL HX:  Social History   Socioeconomic History  . Marital status: Single    Spouse name: Not on file  . Number of children: Not on file  . Years of education: Not on file  . Highest education level: Not on file  Occupational History  . Not on file  Social Needs  . Financial resource strain: Not on file  . Food insecurity    Worry: Not on file    Inability: Not on file  . Transportation needs    Medical: Not on file    Non-medical: Not on file  Tobacco Use  . Smoking status: Former Smoker    Years: 35.00    Types: Cigarettes    Quit date: 12/12/2003    Years since quitting: 15.6  . Smokeless tobacco: Never Used  Substance and Sexual Activity  . Alcohol use: No    Comment: 2005  . Drug use: No  Comment: past history of crystal meth addiction -clean since 2005   . Sexual activity: Not Currently    Comment: 2005  Lifestyle  . Physical activity    Days per week: Not on file    Minutes per session: Not on file  . Stress: Not on file  Relationships  . Social Herbalist on phone: Not on file    Gets together: Not on file    Attends religious service: Not on file    Active member of club or organization: Not on file    Attends meetings of clubs or organizations: Not on file    Relationship status: Not on file  Other Topics Concern  . Not on file  Social History Narrative   Homosexual.   Never married.   Hx of methamph abuse; no IV drug use.  [facility] remission drug program, no use since 2005.   Exercises regularly, says he eats a good  diet.   Tob: quit 2004, 50 pack-yr hx.   No alcohol.      Current Outpatient Medications:  .  albuterol (VENTOLIN HFA) 108 (90 Base) MCG/ACT inhaler, Inhale 2 puffs into the lungs every 6 (six) hours as needed for wheezing or shortness of breath., Disp: 1 Inhaler, Rfl: 1 .  aspirin 81 MG chewable tablet, Chew 81 mg by mouth daily.  , Disp: , Rfl:  .  atorvastatin (LIPITOR) 40 MG tablet, TAKE ONE TABLET BY MOUTH DAILY, Disp: 14 tablet, Rfl: 0 .  BIKTARVY 50-200-25 MG TABS tablet, TAKE ONE TABLET BY MOUTH DAILY, Disp: 30 tablet, Rfl: 5  EXAM:  VITALS per patient if applicable: Temp 20.9 F (36.6 C) (Oral)   Wt 158 lb (71.7 kg)   BMI 22.67 kg/m    GENERAL: alert, oriented, appears well and in no acute distress  HEENT: atraumatic, conjunttiva clear, no obvious abnormalities on inspection of external nose and ears  NECK: normal movements of the head and neck  LUNGS: on inspection no signs of respiratory distress, breathing rate appears normal, no obvious gross SOB, gasping or wheezing  CV: no obvious cyanosis  MS: moves all visible extremities without noticeable abnormality  PSYCH/NEURO: pleasant and cooperative, no obvious depression or anxiety, speech and thought processing grossly intact  LABS: none today    Chemistry      Component Value Date/Time   NA 139 06/09/2019 0922   K 4.5 06/09/2019 0922   CL 107 06/09/2019 0922   CO2 29 06/09/2019 0922   BUN 20 06/09/2019 0922   CREATININE 1.16 06/09/2019 0922      Component Value Date/Time   CALCIUM 9.7 06/09/2019 0922   ALKPHOS 76 03/19/2017 0000   AST 20 06/09/2019 0922   ALT 32 06/09/2019 0922   BILITOT 0.6 06/09/2019 0922   BILITOT 0.3 03/19/2017 0000     Lab Results  Component Value Date   CHOL 145 06/09/2019   HDL 44 06/09/2019   LDLCALC 74 06/09/2019   TRIG 178 (H) 06/09/2019   CHOLHDL 3.3 06/09/2019   Lab Results  Component Value Date   WBC 6.1 06/09/2019   HGB 14.6 06/09/2019   HCT 43.0 06/09/2019    MCV 90.9 06/09/2019   PLT 261 06/09/2019   Lab Results  Component Value Date   TSH 1.48 04/27/2017   No results found for: HGBA1C  ASSESSMENT AND PLAN:  Discussed the following assessment and plan:  1) Hypercholesterolemia: doing well on statin. Lipid panel good 05/2019.  Hepatic panel normal  at that time. Great diet and exercise habits.    I discussed the assessment and treatment plan with the patient. The patient was provided an opportunity to ask questions and all were answered. The patient agreed with the plan and demonstrated an understanding of the instructions.   The patient was advised to call back or seek an in-person evaluation if the symptoms worsen or if the condition fails to improve as anticipated.  F/u: 1 yr, CPE  Signed:  Crissie Sickles, MD           07/16/2019

## 2019-07-25 ENCOUNTER — Other Ambulatory Visit: Payer: Self-pay | Admitting: Family Medicine

## 2019-08-04 ENCOUNTER — Ambulatory Visit (INDEPENDENT_AMBULATORY_CARE_PROVIDER_SITE_OTHER): Payer: Medicare Other | Admitting: Family Medicine

## 2019-08-04 ENCOUNTER — Encounter: Payer: Self-pay | Admitting: Family Medicine

## 2019-08-04 ENCOUNTER — Other Ambulatory Visit: Payer: Self-pay

## 2019-08-04 VITALS — BP 112/70 | Wt 158.0 lb

## 2019-08-04 DIAGNOSIS — M25552 Pain in left hip: Secondary | ICD-10-CM | POA: Diagnosis not present

## 2019-08-04 NOTE — Progress Notes (Signed)
PCP: Tammi Sou, MD  Subjective:   HPI: Patient is a 68 y.o. male here for follow up management of Left hip stress fracture.  He states he has avoided weight bearing activities as instructed by Dr.Hudnall at his prior visit with significant improvement in his symptoms after stopping his daily runs. He mentions that he did not use any topical Voltaren gel or ibuprofen and his symptoms resolved within 2 weeks. He has been completely pain free for the last month and he is eager to resume running. He mentions able to tolerate stationary cycling without any pain or difficulty. He denies any redness, warmth or pain around his left hip. He denies any gait disturbances or difficulty with ambulation. Denies any back pain, bowel/ bladder incontinence, numbness or tingling.  Past Medical History:  Diagnosis Date  . Allergy    Cat allergy  . Bilateral primary osteoarthritis of knee   . Coronary atherosclerosis of unspecified type of vessel, native or graft    CABG 07/2003; annual cardiology f/u (Dr. Pernell Dupre).  Stable as of 02/2017 f/u--echo 03/2017 showed normal LV fxn---ok to d/c ACE-I per cardiologist.  . HIV infection ([place]) Dx 02/2002   ID clinic w/ [facility].  . Hyperlipidemia   . 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 Mild intermittent asthma    As of 2020, pt has not used this in "years" but likes to keep it on hand in case he accidentally encounters cats  . Myocardial infarction ([place]) 2004  . Stress fracture of left hip 2020   running injury  . Substance abuse ([place])    hx of    Current Outpatient Medications on File Prior to Visit  Medication Sig Dispense Refill  . albuterol (VENTOLIN HFA) 108 (90 Base) MCG/ACT inhaler Inhale 2 puffs into the lungs every 6 (six) hours as needed for wheezing or shortness of breath. 1 Inhaler 1  . aspirin 81 MG chewable tablet Chew 81 mg by mouth daily.      Marland Kitchen atorvastatin (LIPITOR) 40 MG tablet Take 1 tablet (40 mg total) by mouth daily. 90  tablet 3  . BIKTARVY 50-200-25 MG TABS tablet TAKE ONE TABLET BY MOUTH DAILY 30 tablet 5   No current facility-administered medications on file prior to visit.     Past Surgical History:  Procedure Laterality Date  . CARDIOVASCULAR STRESS TEST  05/2016   No ischemia  . COLONOSCOPY  01/2011   Normal (recall 10 yrs)  . CORONARY ARTERY BYPASS GRAFT  2004  . HERNIA REPAIR     as infant  . INGUINAL HERNIA REPAIR Right 04/09/2013   Procedure: RIGHT HERNIA REPAIR INGUINAL ADULT;  Surgeon: Haywood Lasso, MD;  Location: [facility];  Service: General;  Laterality: Right;  . TONSILLECTOMY    . TRANSTHORACIC ECHOCARDIOGRAM  03/19/2017   Normal    No Known Allergies  Social History   Socioeconomic History  . Marital status: Single    Spouse name: Not on file  . Number of children: Not on file  . Years of education: Not on file  . Highest education level: Not on file  Occupational History  . Not on file  Social Needs  . Financial resource strain: Not on file  . Food insecurity    Worry: Not on file    Inability: Not on file  . Transportation needs    Medical: Not on file    Non-medical: Not on file  Tobacco Use  .  Smoking status: Former Smoker    Years: 35.00    Types: Cigarettes    Quit date: 12/12/2003    Years since quitting: 15.6  . Smokeless tobacco: Never Used  Substance and Sexual Activity  . Alcohol use: No    Comment: 2005  . Drug use: No    Comment: past history of crystal meth addiction -clean since 2005   . Sexual activity: Not Currently    Comment: 2005  Lifestyle  . Physical activity    Days per week: Not on file    Minutes per session: Not on file  . Stress: Not on file  Relationships  . Social Herbalist on phone: Not on file    Gets together: Not on file    Attends religious service: Not on file    Active member of club or organization: Not on file    Attends meetings of clubs or organizations: Not on file     Relationship status: Not on file  . Intimate partner violence    Fear of current or ex partner: Not on file    Emotionally abused: Not on file    Physically abused: Not on file    Forced sexual activity: Not on file  Other Topics Concern  . Not on file  Social History Narrative   Homosexual.   Never married.   Hx of methamph abuse; no IV drug use.  [facility] remission drug program, no use since 2005.   Exercises regularly, says he eats a good diet.   Tob: quit 2004, 50 pack-yr hx.   No alcohol.    Family History  Problem Relation Age of Onset  . Stroke Mother 39  . Heart failure Father   . Heart disease Father   . Colon cancer Father 59  . Cancer Brother 33       lymphoma    BP 112/70   Wt 158 lb (71.7 kg)   BMI 22.67 kg/m   Review of Systems: See HPI above.     Objective:  Physical Exam:  Gen: NAD, comfortable in exam room Neuro: Sensation intact to touch. 5/5 strength. No gait abnormalities.   Left Hip: No laxity, no deformity, crepitus, edema or warmth. Active and passive range of motion intact with 5 out of 5 strength. No tenderness to palpation Negative [place] Negative hop test, Negative log roll test, Negative fulcrum, Negative piriformis stretch Mild discomfort with fabers test but identical bilaterally  Assessment & Plan:  1. F/u Left hip stress fracture - Symptoms currently resolved with no pain at rest or ambulation or stationary activities.  -  Discussed importance of avoiding strenuous activity to prevent reoccurrence - Can resume activity but at much lower intensity (provided step-by-step instructions on when to resume jogging) - F/u in 3 weeks

## 2019-08-05 ENCOUNTER — Telehealth: Payer: Self-pay

## 2019-08-05 NOTE — Telephone Encounter (Signed)
Received faxed request by pharmacy  That the patient has requested a new 90 day prescription to replace their current prescription that is written for 30 day supply of Biktarvy. Routing to provider for verbal order if ok.  Eugenia Mcalpine

## 2019-08-07 ENCOUNTER — Other Ambulatory Visit: Payer: Self-pay

## 2019-08-07 MED ORDER — BIKTARVY 50-200-25 MG PO TABS: 1.0000 | ORAL_TABLET | Freq: Every day | ORAL | 3 refills | Status: DC

## 2019-08-07 NOTE — Telephone Encounter (Signed)
Yes please, thanks

## 2019-08-08 ENCOUNTER — Encounter: Payer: Self-pay | Admitting: Family Medicine

## 2019-08-08 NOTE — Telephone Encounter (Signed)
Called patient and advised him that it is his choice if he wants to wait or go ahead and get it. Patient seemed a little frustrated with my answer. At the end of the call patient agreed to go ahead and get his flu shot.

## 2019-08-13 ENCOUNTER — Ambulatory Visit (INDEPENDENT_AMBULATORY_CARE_PROVIDER_SITE_OTHER): Payer: Medicare Other

## 2019-08-13 ENCOUNTER — Other Ambulatory Visit: Payer: Self-pay

## 2019-08-13 DIAGNOSIS — Z23 Encounter for immunization: Secondary | ICD-10-CM | POA: Diagnosis not present

## 2019-08-27 ENCOUNTER — Encounter: Payer: Self-pay | Admitting: Family Medicine

## 2019-08-27 ENCOUNTER — Other Ambulatory Visit: Payer: Self-pay

## 2019-08-27 ENCOUNTER — Ambulatory Visit (INDEPENDENT_AMBULATORY_CARE_PROVIDER_SITE_OTHER): Payer: Medicare Other | Admitting: Family Medicine

## 2019-08-27 VITALS — BP 118/72 | Ht 70.0 in | Wt 158.0 lb

## 2019-08-27 DIAGNOSIS — M25552 Pain in left hip: Secondary | ICD-10-CM

## 2019-08-27 NOTE — Patient Instructions (Signed)
Continue following the protocol as directed - don't push beyond this though. Tylenol, icing if needed for mild soreness. Follow up with me in 5-6 weeks or as needed - message me through mychart if you have any problems.

## 2019-08-27 NOTE — Progress Notes (Signed)
PCP: Tammi Sou, MD  Subjective:   HPI: Patient is a 68 y.o. male here for follow for Left hip pain.  States pn is 99% better, only has pain after first 4-8 min into run (runnning on concrete), able to run through pain and only lasts a few min and just feels tight, denies sharp pain or radiation. Denies associated abd or knee pain.  Denies pain post run or at any other time during day. Goal is 3 miles qd 5-6 time q week. States was working on speed and running six days in a row. Has been following progressing in distance as per previous visit. Using no medications, does do static stretching before but not after run (does not do prehab/rehab active stretching). States willing to keep following program.    Past Medical History:  Diagnosis Date  . Allergy    Cat allergy  . Bilateral primary osteoarthritis of knee   . Coronary atherosclerosis of unspecified type of vessel, native or graft    CABG 07/2003; annual cardiology f/u (Dr. Pernell Dupre).  Stable as of 02/2017 f/u--echo 03/2017 showed normal LV fxn---ok to d/c ACE-I per cardiologist.  . HIV infection ([place]) Dx 02/2002   ID clinic w/ [facility].  . Hyperlipidemia   . 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 Mild intermittent asthma    As of 2020, pt has not used this in "years" but likes to keep it on hand in case he accidentally encounters cats  . Myocardial infarction ([place]) 2004  . Stress fracture of left hip 2020   running injury; sx's resolved with rest.  . Substance abuse ([place])    hx of    Current Outpatient Medications on File Prior to Visit  Medication Sig Dispense Refill  . albuterol (VENTOLIN HFA) 108 (90 Base) MCG/ACT inhaler Inhale 2 puffs into the lungs every 6 (six) hours as needed for wheezing or shortness of breath. 1 Inhaler 1  . aspirin 81 MG chewable tablet Chew 81 mg by mouth daily.      Marland Kitchen atorvastatin (LIPITOR) 40 MG tablet Take 1 tablet (40 mg total) by mouth daily. 90 tablet 3  .  bictegravir-emtricitabine-tenofovir AF (BIKTARVY) 50-200-25 MG TABS tablet Take 1 tablet by mouth daily. 90 tablet 3   No current facility-administered medications on file prior to visit.     Past Surgical History:  Procedure Laterality Date  . CARDIOVASCULAR STRESS TEST  05/2016   No ischemia  . COLONOSCOPY  01/2011   Normal (recall 10 yrs)  . CORONARY ARTERY BYPASS GRAFT  2004  . HERNIA REPAIR     as infant  . INGUINAL HERNIA REPAIR Right 04/09/2013   Procedure: RIGHT HERNIA REPAIR INGUINAL ADULT;  Surgeon: Haywood Lasso, MD;  Location: [facility];  Service: General;  Laterality: Right;  . TONSILLECTOMY    . TRANSTHORACIC ECHOCARDIOGRAM  03/19/2017   Normal    No Known Allergies  Social History   Socioeconomic History  . Marital status: Single    Spouse name: Not on file  . Number of children: Not on file  . Years of education: Not on file  . Highest education level: Not on file  Occupational History  . Not on file  Social Needs  . Financial resource strain: Not on file  . Food insecurity    Worry: Not on file    Inability: Not on file  . Transportation needs    Medical: Not on file  Non-medical: Not on file  Tobacco Use  . Smoking status: Former Smoker    Years: 35.00    Types: Cigarettes    Quit date: 12/12/2003    Years since quitting: 15.7  . Smokeless tobacco: Never Used  Substance and Sexual Activity  . Alcohol use: No    Comment: 2005  . Drug use: No    Comment: past history of crystal meth addiction -clean since 2005   . Sexual activity: Not Currently    Comment: 2005  Lifestyle  . Physical activity    Days per week: Not on file    Minutes per session: Not on file  . Stress: Not on file  Relationships  . Social Herbalist on phone: Not on file    Gets together: Not on file    Attends religious service: Not on file    Active member of club or organization: Not on file    Attends meetings of clubs or organizations:  Not on file    Relationship status: Not on file  . Intimate partner violence    Fear of current or ex partner: Not on file    Emotionally abused: Not on file    Physically abused: Not on file    Forced sexual activity: Not on file  Other Topics Concern  . Not on file  Social History Narrative   Homosexual.   Never married.   Hx of methamph abuse; no IV drug use.  [facility] remission drug program, no use since 2005.   Exercises regularly, says he eats a good diet.   Tob: quit 2004, 50 pack-yr hx.   No alcohol.    Family History  Problem Relation Age of Onset  . Stroke Mother 41  . Heart failure Father   . Heart disease Father   . Colon cancer Father 73  . Cancer Brother 25       lymphoma    BP 118/72   Ht 5\' 10"  (1.778 m)   Wt 158 lb (71.7 kg)   BMI 22.67 kg/m   Review of Systems: See HPI above.     Objective:  Physical Exam: GEN: NAD, WDWN male Pulm: no increased work of breathing CA: cap refil <2 Ext Left hip visual exam symmetrical, FROM bilateral symmetrical and w/o pain full extension flexion and rotation, sensation in tact bilat, slight TTP localized just distal to iliac crest and medial, no tenderness over greater troch/ITB, neg hop test, neg fulcrum, symmetric and 5/5 strength on hip extension and knee flex&ext.    Assessment & Plan:  1. Left hip stress fracture - over 7 weeks out from last run.  Cont running/walking progression as Rx'd as per previous visit. Once complete can continue working on speed with training but off set total weekly mileage if aggressive. Can advance w/ light strength training as tolerated.  2. Consider active prehab and rehab exercises with mobility training such as yoga if problem returns 2. RTC if pn returns or worsens

## 2019-09-17 NOTE — Progress Notes (Signed)
Cardiology Office Note:    Date:  09/18/2019   ID:  Edwin Padilla, DOB November 12, 1951, MRN [id number]  PCP:  Tammi Sou, MD  Cardiologist:  No primary care provider on file.   Referring MD: Tammi Sou, MD   Chief Complaint  Patient presents with  . Coronary Artery Disease    History of Present Illness:    Edwin Padilla is a 68 y.o. male with a hx of CAD, 2004 CABG with LIMA to LAD and SVG to diagonal 1, hyperlipidemia, untreated HIV infection.  He is doing well.  He has not had angina.  Denies orthopnea and PND.  He has been sequestered during the [place] 19 pandemic.  He denies palpitations.  He has not had syncope or no lower extremity edema is noted on exam.  He did have an orthopedic injury with a hairline fracture in his left hip and thigh due to overuse/exercise.  Past Medical History:  Diagnosis Date  . Allergy    Cat allergy  . Bilateral primary osteoarthritis of knee   . Coronary atherosclerosis of unspecified type of vessel, native or graft    CABG 07/2003; annual cardiology f/u (Dr. Pernell Dupre).  Stable as of 02/2017 f/u--echo 03/2017 showed normal LV fxn---ok to d/c ACE-I per cardiologist.  . HIV infection ([place]) Dx 02/2002   ID clinic w/ [facility].  . Hyperlipidemia   . 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 Mild intermittent asthma    As of 2020, pt has not used this in "years" but likes to keep it on hand in case he accidentally encounters cats  . Myocardial infarction ([place]) 2004  . Stress fracture of left hip 2020   running injury; sx's resolved with rest.  . Substance abuse ([place])    hx of    Past Surgical History:  Procedure Laterality Date  . CARDIOVASCULAR STRESS TEST  05/2016   No ischemia  . COLONOSCOPY  01/2011   Normal (recall 10 yrs)  . CORONARY ARTERY BYPASS GRAFT  2004  . HERNIA REPAIR     as infant  . INGUINAL HERNIA REPAIR Right 04/09/2013   Procedure: RIGHT HERNIA REPAIR INGUINAL ADULT;  Surgeon: Haywood Lasso, MD;  Location:  [facility];  Service: General;  Laterality: Right;  . TONSILLECTOMY    . TRANSTHORACIC ECHOCARDIOGRAM  03/19/2017   Normal    Current Medications: Current Meds  Medication Sig  . albuterol (VENTOLIN HFA) 108 (90 Base) MCG/ACT inhaler Inhale 2 puffs into the lungs every 6 (six) hours as needed for wheezing or shortness of breath.  Marland Kitchen aspirin 81 MG chewable tablet Chew 81 mg by mouth daily.    Marland Kitchen atorvastatin (LIPITOR) 40 MG tablet Take 1 tablet (40 mg total) by mouth daily.  . bictegravir-emtricitabine-tenofovir AF (BIKTARVY) 50-200-25 MG TABS tablet Take 1 tablet by mouth daily.     Allergies:   Patient has no known allergies.   Social History   Socioeconomic History  . Marital status: Single    Spouse name: Not on file  . Number of children: Not on file  . Years of education: Not on file  . Highest education level: Not on file  Occupational History  . Not on file  Social Needs  . Financial resource strain: Not on file  . Food insecurity    Worry: Not on file    Inability: Not on file  . Transportation needs    Medical: Not on file  Non-medical: Not on file  Tobacco Use  . Smoking status: Former Smoker    Years: 35.00    Types: Cigarettes    Quit date: 12/12/2003    Years since quitting: 15.7  . Smokeless tobacco: Never Used  Substance and Sexual Activity  . Alcohol use: No    Comment: 2005  . Drug use: No    Comment: past history of crystal meth addiction -clean since 2005   . Sexual activity: Not Currently    Comment: 2005  Lifestyle  . Physical activity    Days per week: Not on file    Minutes per session: Not on file  . Stress: Not on file  Relationships  . Social Herbalist on phone: Not on file    Gets together: Not on file    Attends religious service: Not on file    Active member of club or organization: Not on file    Attends meetings of clubs or organizations: Not on file    Relationship status: Not on file  Other Topics  Concern  . Not on file  Social History Narrative   Homosexual.   Never married.   Hx of methamph abuse; no IV drug use.  [facility] remission drug program, no use since 2005.   Exercises regularly, says he eats a good diet.   Tob: quit 2004, 50 pack-yr hx.   No alcohol.     Family History: The patient's family history includes Cancer (age of onset: 73) in his brother; Colon cancer (age of onset: 83) in his father; Heart disease in his father; Heart failure in his father; Stroke (age of onset: 55) in his mother.  ROS:   Please see the history of present illness.    Had a 10-week pause from physical activity to allow a hairline fracture in the left hip to heal.  He is back to rigorous activity.  He is somewhat concerned about slowly creeping blood pressures.  Generally still less than 0000000 systolic.  Diastolics are less than 80.  All other systems reviewed and are negative.  EKGs/Labs/Other Studies Reviewed:    The following studies were reviewed today: None  EKG:  EKG performed on 09/18/2019, reveals normal sinus rhythm with first-degree AV block.  Recent Labs: 06/09/2019: ALT 32; BUN 20; Creat 1.16; Hemoglobin 14.6; Platelets 261; Potassium 4.5; Sodium 139  Recent Lipid Panel    Component Value Date/Time   CHOL 145 06/09/2019 0922   CHOL 162 03/19/2017 0000   TRIG 178 (H) 06/09/2019 0922   HDL 44 06/09/2019 0922   HDL 58 03/19/2017 0000   CHOLHDL 3.3 06/09/2019 0922   VLDL 17 04/19/2016 0905   LDLCALC 74 06/09/2019 0922    Physical Exam:    VS:  BP 122/80   Pulse (!) 59   Ht 5\' 10"  (1.778 m)   Wt 165 lb 12.8 oz (75.2 kg)   SpO2 95%   BMI 23.79 kg/m     Wt Readings from Last 3 Encounters:  09/18/19 165 lb 12.8 oz (75.2 kg)  08/27/19 158 lb (71.7 kg)  08/04/19 158 lb (71.7 kg)     GEN: Normal. No acute distress HEENT: Normal NECK: No JVD. LYMPHATICS: No lymphadenopathy CARDIAC:  RRR without murmur, gallop, or edema. VASCULAR:  Normal Pulses. No bruits.  RESPIRATORY:  Clear to auscultation without rales, wheezing or rhonchi  ABDOMEN: Soft, non-tender, non-distended, No pulsatile mass, MUSCULOSKELETAL: No deformity  SKIN: Warm and dry NEUROLOGIC:  Alert and oriented  x 3 PSYCHIATRIC:  Normal affect   ASSESSMENT:    1. Coronary artery disease of bypass graft of native heart with stable angina pectoris ([place])   2. Other hyperlipidemia   3. Asymptomatic HIV infection ([place])   4. Educated about COVID-19 virus infection    PLAN:    In order of problems listed above:  1. Secondary prevention is discussed in great detail. 2. LDL less than 70 is the target.  Most recent data demonstrates 57 in June 2020.  No change in statin intensity. 3. Not discussed 4. The 3W's for discussed and endorsed as lifestyle practices.   Medication Adjustments/Labs and Tests Ordered: Current medicines are reviewed at length with the patient today.  Concerns regarding medicines are outlined above.  No orders of the defined types were placed in this encounter.  No orders of the defined types were placed in this encounter.   There are no Patient Instructions on file for this visit.   Signed, Sinclair Grooms, MD  09/18/2019 9:13 AM    [place]

## 2019-09-18 ENCOUNTER — Encounter: Payer: Self-pay | Admitting: Interventional Cardiology

## 2019-09-18 ENCOUNTER — Ambulatory Visit (INDEPENDENT_AMBULATORY_CARE_PROVIDER_SITE_OTHER): Payer: Medicare Other | Admitting: Interventional Cardiology

## 2019-09-18 ENCOUNTER — Other Ambulatory Visit: Payer: Self-pay

## 2019-09-18 VITALS — BP 122/80 | HR 59 | Ht 70.0 in | Wt 165.8 lb

## 2019-09-18 DIAGNOSIS — Z21 Asymptomatic human immunodeficiency virus [HIV] infection status: Secondary | ICD-10-CM

## 2019-09-18 DIAGNOSIS — I25708 Atherosclerosis of coronary artery bypass graft(s), unspecified, with other forms of angina pectoris: Secondary | ICD-10-CM

## 2019-09-18 DIAGNOSIS — E7849 Other hyperlipidemia: Secondary | ICD-10-CM | POA: Diagnosis not present

## 2019-09-18 DIAGNOSIS — Z7189 Other specified counseling: Secondary | ICD-10-CM

## 2019-09-18 NOTE — Patient Instructions (Signed)
Medication Instructions:  Your physician recommends that you continue on your current medications as directed. Please refer to the Current Medication list given to you today.  If you need a refill on your cardiac medications before your next appointment, please call your pharmacy.   Lab work: None If you have labs (blood work) drawn today and your tests are completely normal, you will receive your results only by: Marland Kitchen MyChart Message (if you have MyChart) OR . A paper copy in the mail If you have any lab test that is abnormal or we need to change your treatment, we will call you to review the results.  Testing/Procedures: None  Follow-Up: At [facility], you and your health needs are our priority.  As part of our continuing mission to provide you with exceptional heart care, we have created designated Provider Care Teams.  These Care Teams include your primary Cardiologist (physician) and Advanced Practice Providers (APPs -  Physician Assistants and Nurse Practitioners) who all work together to provide you with the care you need, when you need it. You will need a follow up appointment in 12 months.  Please call our office 2 months in advance to schedule this appointment.  You may see Dr. Daneen Schick or one of the following Advanced Practice Providers on your designated Care Team:   Truitt Merle, NP Cecilie Kicks, NP . Kathyrn Drown, NP  Any Other Special Instructions Will Be Listed Below (If Applicable).

## 2019-09-23 DIAGNOSIS — Z20828 Contact with and (suspected) exposure to other viral communicable diseases: Secondary | ICD-10-CM | POA: Diagnosis not present

## 2019-09-23 DIAGNOSIS — Z7189 Other specified counseling: Secondary | ICD-10-CM | POA: Diagnosis not present

## 2019-10-31 ENCOUNTER — Other Ambulatory Visit: Payer: Self-pay

## 2019-12-24 DIAGNOSIS — Z03818 Encounter for observation for suspected exposure to other biological agents ruled out: Secondary | ICD-10-CM | POA: Diagnosis not present

## 2020-01-08 ENCOUNTER — Other Ambulatory Visit: Payer: Self-pay | Admitting: Pharmacist

## 2020-01-08 DIAGNOSIS — Z21 Asymptomatic human immunodeficiency virus [HIV] infection status: Secondary | ICD-10-CM

## 2020-01-08 MED ORDER — BIKTARVY 50-200-25 MG PO TABS: 1.0000 | ORAL_TABLET | Freq: Every day | ORAL | 3 refills | Status: DC

## 2020-01-08 NOTE — Progress Notes (Unsigned)
Transferred Biktarvy Rx to [place] [facility] per [employer].

## 2020-01-09 ENCOUNTER — Ambulatory Visit: Payer: Medicare Other

## 2020-01-14 ENCOUNTER — Ambulatory Visit: Payer: Medicare Other

## 2020-01-17 ENCOUNTER — Ambulatory Visit: Payer: Medicare Other | Attending: Internal Medicine

## 2020-01-17 DIAGNOSIS — Z23 Encounter for immunization: Secondary | ICD-10-CM

## 2020-01-17 NOTE — Progress Notes (Signed)
   Covid-19 Vaccination Clinic  Name:  Edwin Padilla    MRN: [id number] DOB: February 14, 1951  01/17/2020  [name] was observed post Covid-19 immunization for 15 minutes without incidence. He was provided with Vaccine Information Sheet and instruction to access the V-Safe system.   [name] was instructed to call 911 with any severe reactions post vaccine: Marland Kitchen Difficulty breathing  . Swelling of your face and throat  . A fast heartbeat  . A bad rash all over your body  . Dizziness and weakness    Immunizations Administered    Name Date Dose VIS Date Route   Pfizer COVID-19 Vaccine 01/17/2020  2:30 PM 0.3 mL 11/21/2019 Intramuscular   Manufacturer: [place]   Lot: [id number]   [place]: [id number]

## 2020-01-20 ENCOUNTER — Other Ambulatory Visit: Payer: Self-pay

## 2020-01-20 DIAGNOSIS — Z21 Asymptomatic human immunodeficiency virus [HIV] infection status: Secondary | ICD-10-CM

## 2020-01-20 MED ORDER — BIKTARVY 50-200-25 MG PO TABS: 1.0000 | ORAL_TABLET | Freq: Every day | ORAL | 3 refills | Status: DC

## 2020-01-23 ENCOUNTER — Telehealth: Payer: Self-pay | Admitting: Interventional Cardiology

## 2020-01-23 MED ORDER — LISINOPRIL 5 MG PO TABS: 5.0000 mg | ORAL_TABLET | Freq: Every day | ORAL | 3 refills | Status: DC

## 2020-01-23 NOTE — Telephone Encounter (Signed)
Patient calling to follow up on his mychart message he sent 2/10. He says he will be in a meeting today from 10-11.

## 2020-01-23 NOTE — Telephone Encounter (Signed)
Resume Lisinopril. Remind me how long ago was Lisinopril stopped? Continue measuring and report back in 2 weeks. BP measurements at least 2 hours after lisinopril taken  Above instructions are from Dr Tamala Julian in my chart message. I spoke with patient and gave him instructions. Lisinopril was stopped in May 2018.  Previous dose was 5 mg daily. He will resume this dose. Will send prescription to [facility] Oppenheim at [facility].

## 2020-02-10 ENCOUNTER — Telehealth: Payer: Self-pay | Admitting: Interventional Cardiology

## 2020-02-10 NOTE — Telephone Encounter (Signed)
Spoke with pt and made him aware that BPs were normal.  Answered all questions.  Pt appreciative for call.

## 2020-02-10 NOTE — Telephone Encounter (Signed)
Patient sent BP readings via mychart message to Dr. Tamala Julian. He states he has not heard back. Please reply back to patient on BP readings - he was quite upset over the phone stating that this is the 3rd time he has sent a message via mychart message and noone has responded.

## 2020-02-11 ENCOUNTER — Ambulatory Visit: Payer: Medicare Other | Attending: Internal Medicine

## 2020-02-11 DIAGNOSIS — Z23 Encounter for immunization: Secondary | ICD-10-CM | POA: Insufficient documentation

## 2020-02-11 NOTE — Progress Notes (Signed)
   Covid-19 Vaccination Clinic  Name:  CLEOTHA ARRIAZA    MRN: [id number] DOB: 24-Dec-1950  02/11/2020  Mr. Slaby was observed post Covid-19 immunization for 15 minutes without incident. He was provided with Vaccine Information Sheet and instruction to access the V-Safe system.   Mr. Donnel was instructed to call 911 with any severe reactions post vaccine: Marland Kitchen Difficulty breathing  . Swelling of face and throat  . A fast heartbeat  . A bad rash all over body  . Dizziness and weakness   Immunizations Administered    Name Date Dose VIS Date Route   Pfizer COVID-19 Vaccine 02/11/2020 10:05 AM 0.3 mL 11/21/2019 Intramuscular   Manufacturer: [place]   Lot: [id number]   [place]: [id number]

## 2020-02-27 ENCOUNTER — Other Ambulatory Visit: Payer: Self-pay

## 2020-02-27 ENCOUNTER — Ambulatory Visit (INDEPENDENT_AMBULATORY_CARE_PROVIDER_SITE_OTHER): Payer: Medicare Other | Admitting: Family Medicine

## 2020-02-27 ENCOUNTER — Encounter: Payer: Self-pay | Admitting: Family Medicine

## 2020-02-27 VITALS — BP 119/77 | HR 53 | Temp 97.9°F | Resp 16 | Ht 70.0 in | Wt 170.0 lb

## 2020-02-27 DIAGNOSIS — M7918 Myalgia, other site: Secondary | ICD-10-CM | POA: Diagnosis not present

## 2020-02-27 NOTE — Progress Notes (Signed)
OFFICE VISIT  02/27/2020   CC:  Chief Complaint  Patient presents with  . Neck Pain    x1-2 months     HPI:    Patient is a 69 y.o. Caucasian male who presents for neck pain. Onset about 2 mo ago, waxing and waning, seems to be focused in L lateral soft tissue area. Other times seems to be all over neck soft tissues and over tops of traps.  Pain is dull.  No radicular pain, no paresthesias.  No preceding injury or strain.  Of note, Dr. Tamala Julian put him back on 5 mg lisinopril b/c of gradual increase in bp. Some HAs had been occurring and these went away.  Slight cough, went away. Sleep not as good as usual.  Past Medical History:  Diagnosis Date  . Allergy    Cat allergy  . Bilateral primary osteoarthritis of knee   . Coronary atherosclerosis of unspecified type of vessel, native or graft    CABG 07/2003; annual cardiology f/u (Dr. Pernell Dupre).  Stable as of 02/2017 f/u--echo 03/2017 showed normal LV fxn---ok to d/c ACE-I per cardiologist.  . HIV infection ([place]) Dx 02/2002   ID clinic w/ [facility].  . Hyperlipidemia   . 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 Mild intermittent asthma    As of 2020, pt has not used this in "years" but likes to keep it on hand in case he accidentally encounters cats  . Myocardial infarction ([place]) 2004  . Stress fracture of left hip 2020   running injury; sx's resolved with rest.  . Substance abuse ([place])    hx of    Past Surgical History:  Procedure Laterality Date  . CARDIOVASCULAR STRESS TEST  05/2016   No ischemia  . COLONOSCOPY  01/2011   Normal (recall 10 yrs)  . CORONARY ARTERY BYPASS GRAFT  2004  . HERNIA REPAIR     as infant  . INGUINAL HERNIA REPAIR Right 04/09/2013   Procedure: RIGHT HERNIA REPAIR INGUINAL ADULT;  Surgeon: Haywood Lasso, MD;  Location: [facility];  Service: General;  Laterality: Right;  . TONSILLECTOMY    . TRANSTHORACIC ECHOCARDIOGRAM  03/19/2017   Normal    Outpatient Medications  Prior to Visit  Medication Sig Dispense Refill  . aspirin 81 MG chewable tablet Chew 81 mg by mouth daily.      Marland Kitchen atorvastatin (LIPITOR) 40 MG tablet Take 1 tablet (40 mg total) by mouth daily. 90 tablet 3  . bictegravir-emtricitabine-tenofovir AF (BIKTARVY) 50-200-25 MG TABS tablet Take 1 tablet by mouth daily. 90 tablet 3  . lisinopril (ZESTRIL) 5 MG tablet Take 1 tablet (5 mg total) by mouth daily. 90 tablet 3  . albuterol (VENTOLIN HFA) 108 (90 Base) MCG/ACT inhaler Inhale 2 puffs into the lungs every 6 (six) hours as needed for wheezing or shortness of breath. (Patient not taking: Reported on 02/27/2020) 1 Inhaler 1   No facility-administered medications prior to visit.    No Known Allergies  ROS As per HPI  PE: Blood pressure 119/77, pulse (!) 53, temperature 97.9 F (36.6 C), temperature source Temporal, resp. rate 16, height 5\' 10"  (1.778 m), weight 170 lb (77.1 kg), SpO2 98 %. Body mass index is 24.39 kg/m.  Gen: Alert, well appearing.  Patient is oriented to person, place, time, and situation. AFFECT: pleasant, lucid thought and speech. Neck ROM fully intact w/out pain. No neck soft tissue or bony tenderness.  Carotid pulses 2+ bilat w/out  bruit.  No pain with resisted neck rotation when palpating SCM muscles, but this is where he indicates his pain is mostly located (on L mostly).  No TTP of traps, scaps, or midline T spine.  UE strength 5/5 prox and dist bilat.  LABS:  Lab Results  Component Value Date   TSH 1.48 04/27/2017   Lab Results  Component Value Date   WBC 6.1 06/09/2019   HGB 14.6 06/09/2019   HCT 43.0 06/09/2019   MCV 90.9 06/09/2019   PLT 261 06/09/2019   Lab Results  Component Value Date   CREATININE 1.16 06/09/2019   BUN 20 06/09/2019   NA 139 06/09/2019   K 4.5 06/09/2019   CL 107 06/09/2019   CO2 29 06/09/2019   Lab Results  Component Value Date   ALT 32 06/09/2019   AST 20 06/09/2019   ALKPHOS 76 03/19/2017   BILITOT 0.6 06/09/2019    Lab Results  Component Value Date   CHOL 145 06/09/2019   Lab Results  Component Value Date   HDL 44 06/09/2019   Lab Results  Component Value Date   LDLCALC 74 06/09/2019   Lab Results  Component Value Date   TRIG 178 (H) 06/09/2019   Lab Results  Component Value Date   CHOLHDL 3.3 06/09/2019   Lab Results  Component Value Date   PSA 0.43 04/27/2017   PSA 0.46 03/15/2016    IMPRESSION AND PLAN:  Myofascial soft tissue neck pain/strain. No sign of osteoarthritis. Reassured.  Recommended OTC pain med prn, heat 20 min qd-bid, neck ROM/yoga/lightstretching and strengthening. Signs/symptoms to call or return for were reviewed and pt expressed understanding.  An After Visit Summary was printed and given to the patient.  FOLLOW UP: Return if symptoms worsen or fail to improve.  Signed:  Crissie Sickles, MD           02/27/2020

## 2020-03-11 MED ORDER — LOSARTAN POTASSIUM 25 MG PO TABS: 25.0000 mg | ORAL_TABLET | Freq: Every day | ORAL | 3 refills | Status: DC

## 2020-03-11 NOTE — Telephone Encounter (Signed)
Belva Crome, MD  You 4 minutes ago (3:38 PM)   An alternative is to stop Lisinopril and start Losartan 25 mg daily. Cough should resolve in 7-14 days. Let us if it does not.   Message text

## 2020-06-11 ENCOUNTER — Ambulatory Visit (HOSPITAL_COMMUNITY)
Admission: RE | Admit: 2020-06-11 | Discharge: 2020-06-11 | Disposition: A | Discharge status: Home / Self Care | Payer: BC Managed Care – PPO | Source: Ambulatory Visit | Attending: Internal Medicine | Admitting: Internal Medicine

## 2020-06-11 ENCOUNTER — Ambulatory Visit (INDEPENDENT_AMBULATORY_CARE_PROVIDER_SITE_OTHER): Payer: BC Managed Care – PPO | Admitting: Internal Medicine

## 2020-06-11 ENCOUNTER — Other Ambulatory Visit: Payer: Self-pay

## 2020-06-11 ENCOUNTER — Encounter: Payer: Self-pay | Admitting: Internal Medicine

## 2020-06-11 ENCOUNTER — Telehealth: Payer: Self-pay | Admitting: Interventional Cardiology

## 2020-06-11 DIAGNOSIS — R071 Chest pain on breathing: Secondary | ICD-10-CM | POA: Diagnosis not present

## 2020-06-11 DIAGNOSIS — I517 Cardiomegaly: Secondary | ICD-10-CM | POA: Diagnosis not present

## 2020-06-11 DIAGNOSIS — I2510 Atherosclerotic heart disease of native coronary artery without angina pectoris: Secondary | ICD-10-CM | POA: Diagnosis not present

## 2020-06-11 LAB — POCT I-STAT CREATININE: Creatinine, Ser: 1.1 mg/dL (ref 0.61–1.24)

## 2020-06-11 MED ORDER — SODIUM CHLORIDE (PF) 0.9 % IJ SOLN
INTRAMUSCULAR | Status: AC
  Filled 2020-06-11: qty 50

## 2020-06-11 MED ORDER — IOHEXOL 350 MG/ML SOLN
100.0000 mL | Freq: Once | INTRAVENOUS | Status: AC | PRN
  Administered 2020-06-11: 100 mL via INTRAVENOUS

## 2020-06-11 NOTE — Telephone Encounter (Signed)
I spoke with patient. He reports pain on left side of chest. Started yesterday. Seems to be less today. He did cancel flight to [place] today out of caution.  Pain only occurs when he takes a deep breath. No cough.  Has not taken anything for the pain.  No pattern to pain. Not related to exertion. Different than pain prior to heart surgery. Not related to meals but he is burping a lot. He was able to sleep OK last night. History of asthma but has not used an inhaler for many years.  He also ran on Wednesday and had dizzy spell.  Thinks he may have been dehydrated. No dizziness since that time. No chest pain while running on Wednesday Will forward to Dr Tamala Julian for review/recommendations

## 2020-06-11 NOTE — Telephone Encounter (Signed)
    Pt c/o of Chest Pain: STAT if CP now or developed within 24 hours  1. Are you having CP right now? No  2. Are you experiencing any other symptoms (ex. SOB, nausea, vomiting, sweating)? Denied other symptoms   3. How long have you been experiencing CP? Yesterday   4. Is your CP continuous or coming and going? It's constant when he breaths   5. Have you taken Nitroglycerin? No  Pt said yesterday when he breath he feels pain on his left side of his chest, he also said it feels like indigestion. He said it is a little less today   ?

## 2020-06-11 NOTE — Telephone Encounter (Signed)
Sounds pleuritic/musculoskeletal. Should be seen out of abundance of caution since he is trying to travel. [place] see him? He could also be seen by PCP or Urgent care. This seems unlikely cardiac.

## 2020-06-11 NOTE — Telephone Encounter (Signed)
Dr Caryl Comes ([facility]) can see patient today at 2:45. I spoke with patient and gave him information from Dr Tamala Julian.  Patient would like to see Dr Caryl Comes today.  Patient aware to arrive at 2:30

## 2020-06-11 NOTE — Progress Notes (Signed)
ELECTROPHYSIOLOGY OFFICE NOTE  Patient ID: Edwin Padilla, MRN: [id number], DOB/AGE: [birth year] 69 y.o. Admit date: (Not on file) Date of Consult: 06/11/2020  Primary Physician: Tammi Sou, MD Primary Cardiologist: [facility]     Edwin Padilla is a 69 y.o. male who is being seen today for the evaluation of pleurtic chest pain at the request    HPI Edwin Padilla is a 69 y.o. male is seen today as an add-on because of pleuritic chest pain over the last 48 hours.  It is in his left upper largely posterior but also somewhat anterior chest.  Somewhat better today than yesterday.  Is a runner and ran last on Wednesday.  No exertional chest discomfort.  History of ischemic heart disease with bypass surgery 2004.  No history of edema, clots, cancer, bleeding diathesis.  DATE TEST EF   6/07 Myoview    No ischemia  Small lateral infarct  4/18 Echo   55-60 %         Date Cr K Hgb  6/20 1.16 4.5 14.6         Also has a history of recurring spells.  The last of which was Wednesday.  Typically occurs on heavy running days although this was 4 to 5 hours after run.  There is a sensation of instability in the environment.  Not vertiginous.  Improved with sitting aggravated by moving and or standing.  Duration is hours.  No associated palpitations.   Past Medical History:  Diagnosis Date  . Allergy    Cat allergy  . Bilateral primary osteoarthritis of knee   . Coronary atherosclerosis of unspecified type of vessel, native or graft    CABG 07/2003; annual cardiology f/u (Dr. Pernell Dupre).  Stable as of 02/2017 f/u--echo 03/2017 showed normal LV fxn---ok to d/c ACE-I per cardiologist.  . HIV infection ([place]) Dx 02/2002   ID clinic w/ [facility].  . Hyperlipidemia   . 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 Mild intermittent asthma    As of 2020, pt has not used this in "years" but likes to keep it on hand in case he accidentally encounters cats  . Myocardial infarction ([place]) 2004  . Stress  fracture of left hip 2020   running injury; sx's resolved with rest.  . Substance abuse ([place])    hx of      Surgical History:  Past Surgical History:  Procedure Laterality Date  . CARDIOVASCULAR STRESS TEST  05/2016   No ischemia  . COLONOSCOPY  01/2011   Normal (recall 10 yrs)  . CORONARY ARTERY BYPASS GRAFT  2004  . HERNIA REPAIR     as infant  . INGUINAL HERNIA REPAIR Right 04/09/2013   Procedure: RIGHT HERNIA REPAIR INGUINAL ADULT;  Surgeon: Haywood Lasso, MD;  Location: [place];  Service: General;  Laterality: Right;  . TONSILLECTOMY    . TRANSTHORACIC ECHOCARDIOGRAM  03/19/2017   Normal     Home Meds: No outpatient medications have been marked as taking for the 06/11/20 encounter (Office Visit) with Deboraha Sprang, MD.    Allergies: No Known Allergies  Social History   Socioeconomic History  . Marital status: Single    Spouse name: Not on file  . Number of children: Not on file  . Years of education: Not on file  . Highest education level: Not on file  Occupational History  . Not on file  Tobacco Use  .  Smoking status: Former Smoker    Years: 35.00    Types: Cigarettes    Quit date: 12/12/2003    Years since quitting: 16.5  . Smokeless tobacco: Never Used  Vaping Use  . Vaping Use: Never used  Substance and Sexual Activity  . Alcohol use: No    Comment: 2005  . Drug use: No    Comment: past history of crystal meth addiction -clean since 2005   . Sexual activity: Not Currently    Comment: 2005  Other Topics Concern  . Not on file  Social History Narrative   Homosexual.   Never married.   Hx of methamph abuse; no IV drug use.  [facility] remission drug program, no use since 2005.   Exercises regularly, says he eats a good diet.   Tob: quit 2004, 50 pack-yr hx.   No alcohol.   Social Determinants of Health   Financial Resource Strain:   . Difficulty of Paying Living Expenses:   Food Insecurity:   . Worried About Sales executive in the Last Year:   . Arboriculturist in the Last Year:   Transportation Needs:   . Film/video editor (Medical):   Marland Kitchen Lack of Transportation (Non-Medical):   Physical Activity:   . Days of Exercise per Week:   . Minutes of Exercise per Session:   Stress:   . Feeling of Stress :   Social Connections:   . Frequency of Communication with Friends and Family:   . Frequency of Social Gatherings with Friends and Family:   . Attends Religious Services:   . Active Member of Clubs or Organizations:   . Attends Archivist Meetings:   Marland Kitchen Marital Status:   Intimate Partner Violence:   . Fear of Current or Ex-Partner:   . Emotionally Abused:   Marland Kitchen Physically Abused:   . Sexually Abused:      Family History  Problem Relation Age of Onset  . Stroke Mother 82  . Heart failure Father   . Heart disease Father   . Colon cancer Father 32  . Cancer Brother 75       lymphoma     ROS:  Please see the history of present illness.     All other systems reviewed and negative.    Physical Exam: Blood pressure 128/72, pulse (!) 2, height 5\' 10"  (1.778 m), weight 166 lb 6.4 oz (75.5 kg), SpO2 97 %. General: Well developed, well nourished male in no acute distress. Head: Normocephalic, atraumatic, sclera non-icteric, no xanthomas, nares are without discharge. EENT: normal  Lymph Nodes:  none Neck: Negative for carotid bruits. JVD not elevated. Back:without scoliosis kyphosis Lungs: Clear bilaterally to auscultation without wheezes, rales, or rhonchi. Breathing is unlabored. Heart: RRR with S1 S2. No  murmur . No rubs, or gallops appreciated. Abdomen: Soft, non-tender, non-distended with normoactive bowel sounds. No hepatomegaly. No rebound/guarding. No obvious abdominal masses. Msk:  Strength and tone appear normal for age. Extremities: No clubbing or cyanosis. No edema.  Distal pedal pulses are 2+ and equal bilaterally. Skin: Warm and Dry Neuro: Alert and oriented X 3. CN III-XII  intact Grossly normal sensory and motor function . Psych:  Responds to questions appropriately with a normal affect.      Labs: Cardiac Enzymes No results for input(s): CKTOTAL, CKMB, TROPONINI in the last 72 hours. CBC Lab Results  Component Value Date   WBC 6.1 06/09/2019   HGB 14.6 06/09/2019   HCT  43.0 06/09/2019   MCV 90.9 06/09/2019   PLT 261 06/09/2019   PROTIME: No results for input(s): LABPROT, INR in the last 72 hours. Chemistry No results for input(s): NA, K, CL, CO2, BUN, CREATININE, CALCIUM, PROT, BILITOT, ALKPHOS, ALT, AST, GLUCOSE in the last 168 hours.  Invalid input(s): LABALBU Lipids Lab Results  Component Value Date   CHOL 145 06/09/2019   HDL 44 06/09/2019   LDLCALC 74 06/09/2019   TRIG 178 (H) 06/09/2019   BNP No results found for: PROBNP Thyroid Function Tests: No results for input(s): TSH, T4TOTAL, T3FREE, THYROIDAB in the last 72 hours.  Invalid input(s): FREET3 Miscellaneous No results found for: DDIMER  Radiology/Studies:  No results found.  EKG: Sinus rhythm at 42 Intervals 24/09/45   Assessment and Plan:  Pleuritic chest pain  Sinus bradycardia  Ischemic heart disease with prior bypass surgery  HIV infection on therapy    Patient has new onset pleuritic chest pain.  No obvious risk factors for pulmonary embolism.  However, this I think is the exclusion.  We will arrange CTA.  Probably will have to be done at [facility].  Chest pain is not likely ischemic given its pleuritic nature.  As relates to his spells, not sure what they are.  Of asked him to check his pulse next time.  He has resting sinus bradycardia but at least by treadmill testing 2019 had a vigorous heart rate response to exercise.      Virl Axe

## 2020-06-11 NOTE — Patient Instructions (Addendum)
Medication Instructions:  Your physician recommends that you continue on your current medications as directed. Please refer to the Current Medication list given to you today.  *If you need a refill on your cardiac medications before your next appointment, please call your pharmacy*   Lab Work: None ordered.  If you have labs (blood work) drawn today and your tests are completely normal, you will receive your results only by: Marland Kitchen MyChart Message (if you have MyChart) OR . A paper copy in the mail If you have any lab test that is abnormal or we need to change your treatment, we will call you to review the results.   Testing/Procedures: Non-Cardiac CT Angiography (CTA), is a special type of CT scan that uses a computer to produce multi-dimensional views of major blood vessels throughout the body. In CT angiography, a contrast material is injected through an IV to help visualize the blood vessels    Follow-Up: At [facility], you and your health needs are our priority.  As part of our continuing mission to provide you with exceptional heart care, we have created designated Provider Care Teams.  These Care Teams include your primary Cardiologist (physician) and Advanced Practice Providers (APPs -  Physician Assistants and Nurse Practitioners) who all work together to provide you with the care you need, when you need it.  We recommend signing up for the patient portal called "MyChart".  Sign up information is provided on this After Visit Summary.  MyChart is used to connect with patients for Virtual Visits (Telemedicine).  Patients are able to view lab/test results, encounter notes, upcoming appointments, etc.  Non-urgent messages can be sent to your provider as well.   To learn more about what you can do with MyChart, go to [email or web address].

## 2020-06-17 NOTE — Addendum Note (Signed)
Addended by: Mendel Ryder on: 06/17/2020 08:29 AM   Modules accepted: Orders

## 2020-06-30 DIAGNOSIS — U071 COVID-19: Secondary | ICD-10-CM | POA: Diagnosis not present

## 2020-07-20 ENCOUNTER — Encounter: Payer: Medicare Other | Admitting: Family Medicine

## 2020-07-22 DIAGNOSIS — Z03818 Encounter for observation for suspected exposure to other biological agents ruled out: Secondary | ICD-10-CM | POA: Diagnosis not present

## 2020-07-24 DIAGNOSIS — Z03818 Encounter for observation for suspected exposure to other biological agents ruled out: Secondary | ICD-10-CM | POA: Diagnosis not present

## 2020-07-26 DIAGNOSIS — Z03818 Encounter for observation for suspected exposure to other biological agents ruled out: Secondary | ICD-10-CM | POA: Diagnosis not present

## 2020-08-10 ENCOUNTER — Other Ambulatory Visit: Payer: Medicare Other

## 2020-08-10 ENCOUNTER — Other Ambulatory Visit (HOSPITAL_COMMUNITY)
Admission: RE | Admit: 2020-08-10 | Discharge: 2020-08-10 | Disposition: A | Discharge status: Home / Self Care | Payer: BC Managed Care – PPO | Source: Ambulatory Visit | Attending: Internal Medicine | Admitting: Internal Medicine

## 2020-08-10 ENCOUNTER — Other Ambulatory Visit: Payer: Self-pay

## 2020-08-10 DIAGNOSIS — Z113 Encounter for screening for infections with a predominantly sexual mode of transmission: Secondary | ICD-10-CM | POA: Diagnosis not present

## 2020-08-10 DIAGNOSIS — E7849 Other hyperlipidemia: Secondary | ICD-10-CM

## 2020-08-10 DIAGNOSIS — Z21 Asymptomatic human immunodeficiency virus [HIV] infection status: Secondary | ICD-10-CM

## 2020-08-10 NOTE — Addendum Note (Signed)
Addended by: Caffie Pinto on: 08/10/2020 09:09 AM   Modules accepted: Orders

## 2020-08-11 LAB — T-HELPER CELL (CD4) - (RCID CLINIC ONLY)
CD4 % Helper T Cell: 35 % (ref 33–65)
CD4 T Cell Abs: 590 /uL (ref 400–1790)

## 2020-08-11 LAB — URINE CYTOLOGY ANCILLARY ONLY
Chlamydia: NEGATIVE
Comment: NEGATIVE
Comment: NORMAL
Neisseria Gonorrhea: NEGATIVE

## 2020-08-12 LAB — CBC WITH DIFFERENTIAL/PLATELET
Absolute Monocytes: 530 cells/uL (ref 200–950)
Basophils Absolute: 21 cells/uL (ref 0–200)
Basophils Relative: 0.4 %
Eosinophils Absolute: 187 cells/uL (ref 15–500)
Eosinophils Relative: 3.6 %
HCT: 40.6 % (ref 38.5–50.0)
Hemoglobin: 13.7 g/dL (ref 13.2–17.1)
Lymphs Abs: 1706 cells/uL (ref 850–3900)
MCH: 31.1 pg (ref 27.0–33.0)
MCHC: 33.7 g/dL (ref 32.0–36.0)
MCV: 92.1 fL (ref 80.0–100.0)
MPV: 10.9 fL (ref 7.5–12.5)
Monocytes Relative: 10.2 %
Neutro Abs: 2756 cells/uL (ref 1500–7800)
Neutrophils Relative %: 53 %
Platelets: 269 10*3/uL (ref 140–400)
RBC: 4.41 10*6/uL (ref 4.20–5.80)
RDW: 13.5 % (ref 11.0–15.0)
Total Lymphocyte: 32.8 %
WBC: 5.2 10*3/uL (ref 3.8–10.8)

## 2020-08-12 LAB — COMPLETE METABOLIC PANEL WITH GFR
AG Ratio: 1.3 (calc) (ref 1.0–2.5)
ALT: 32 U/L (ref 9–46)
AST: 23 U/L (ref 10–35)
Albumin: 4 g/dL (ref 3.6–5.1)
Alkaline phosphatase (APISO): 49 U/L (ref 35–144)
BUN: 20 mg/dL (ref 7–25)
CO2: 30 mmol/L (ref 20–32)
Calcium: 9.8 mg/dL (ref 8.6–10.3)
Chloride: 104 mmol/L (ref 98–110)
Creat: 1.15 mg/dL (ref 0.70–1.25)
GFR, Est African American: 75 mL/min/{1.73_m2} (ref >=60)
GFR, Est Non African American: 65 mL/min/{1.73_m2} (ref >=60)
Globulin: 3.1 g/dL (calc) (ref 1.9–3.7)
Glucose, Bld: 95 mg/dL (ref 65–99)
Potassium: 4.6 mmol/L (ref 3.5–5.3)
Sodium: 139 mmol/L (ref 135–146)
Total Bilirubin: 0.7 mg/dL (ref 0.2–1.2)
Total Protein: 7.1 g/dL (ref 6.1–8.1)

## 2020-08-12 LAB — LIPID PANEL
Cholesterol: 138 mg/dL (ref <200)
HDL: 45 mg/dL (ref >=40)
LDL Cholesterol (Calc): 78 mg/dL (calc)
Non-HDL Cholesterol (Calc): 93 mg/dL (calc) (ref <130)
Total CHOL/HDL Ratio: 3.1 (calc) (ref <5.0)
Triglycerides: 66 mg/dL (ref <150)

## 2020-08-12 LAB — HIV-1 RNA QUANT-NO REFLEX-BLD
HIV 1 RNA Quant: <20 Copies/mL
HIV-1 RNA Quant, Log: <1.3 Log cps/mL

## 2020-08-12 LAB — RPR: RPR Ser Ql: NONREACTIVE

## 2020-08-19 DIAGNOSIS — Z03818 Encounter for observation for suspected exposure to other biological agents ruled out: Secondary | ICD-10-CM | POA: Diagnosis not present

## 2020-08-25 ENCOUNTER — Encounter: Payer: Self-pay | Admitting: Family Medicine

## 2020-08-25 ENCOUNTER — Ambulatory Visit (INDEPENDENT_AMBULATORY_CARE_PROVIDER_SITE_OTHER): Payer: BC Managed Care – PPO | Admitting: Family Medicine

## 2020-08-25 ENCOUNTER — Telehealth: Payer: Self-pay

## 2020-08-25 ENCOUNTER — Other Ambulatory Visit: Payer: Self-pay

## 2020-08-25 VITALS — BP 111/72 | HR 49 | Temp 97.6°F | Resp 16 | Ht 68.5 in | Wt 162.0 lb

## 2020-08-25 DIAGNOSIS — Z125 Encounter for screening for malignant neoplasm of prostate: Secondary | ICD-10-CM | POA: Diagnosis not present

## 2020-08-25 DIAGNOSIS — I10 Essential (primary) hypertension: Secondary | ICD-10-CM

## 2020-08-25 DIAGNOSIS — I2510 Atherosclerotic heart disease of native coronary artery without angina pectoris: Secondary | ICD-10-CM | POA: Diagnosis not present

## 2020-08-25 DIAGNOSIS — Z0000 Encounter for general adult medical examination without abnormal findings: Secondary | ICD-10-CM | POA: Diagnosis not present

## 2020-08-25 DIAGNOSIS — E7800 Pure hypercholesterolemia, unspecified: Secondary | ICD-10-CM | POA: Diagnosis not present

## 2020-08-25 LAB — PSA, MEDICARE: PSA: 0.38 ng/ml (ref 0.10–4.00)

## 2020-08-25 MED ORDER — ALBUTEROL SULFATE HFA 108 (90 BASE) MCG/ACT IN AERS: 1.0000 | INHALATION_SPRAY | Freq: Four times a day (QID) | RESPIRATORY_TRACT | 0 refills | Status: DC | PRN

## 2020-08-25 MED ORDER — ATORVASTATIN CALCIUM 40 MG PO TABS: 40.0000 mg | ORAL_TABLET | Freq: Every day | ORAL | 3 refills | Status: DC

## 2020-08-25 NOTE — Progress Notes (Signed)
Office Note 08/25/2020  CC:  Chief Complaint  Patient presents with  . Annual Exam    CPE    HPI:  Edwin Padilla is a 69 y.o. White male who is here for annual health maintenance exam and f/u HTN and HLD.  Feeling well.  Takes great care of himself.  Runs 3 miles daily, eating healthy.  HTN: home bp's excellent. HLD: tolerating statin.   Past Medical History:  Diagnosis Date  . Allergy    Cat allergy  . Bilateral primary osteoarthritis of knee   . Coronary atherosclerosis of unspecified type of vessel, native or graft    CABG 07/2003; annual cardiology f/u (Dr. Pernell Dupre).  Stable as of 02/2017 f/u--echo 03/2017 showed normal LV fxn---ok to d/c ACE-I per cardiologist.  . HIV infection ([place]) Dx 02/2002   ID clinic w/ [facility].  . Hyperlipidemia   . 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 Mild intermittent asthma    As of 2020, pt has not used this in "years" but likes to keep it on hand in case he accidentally encounters cats  . Myocardial infarction ([place]) 2004  . Stress fracture of left hip 2020   running injury; sx's resolved with rest.  . Substance abuse ([place])    hx of    Past Surgical History:  Procedure Laterality Date  . CARDIOVASCULAR STRESS TEST  05/2016   No ischemia  . COLONOSCOPY  01/2011   Normal (recall 10 yrs)  . CORONARY ARTERY BYPASS GRAFT  2004  . HERNIA REPAIR     as infant  . INGUINAL HERNIA REPAIR Right 04/09/2013   Procedure: RIGHT HERNIA REPAIR INGUINAL ADULT;  Surgeon: Haywood Lasso, MD;  Location: [facility];  Service: General;  Laterality: Right;  . TONSILLECTOMY    . TRANSTHORACIC ECHOCARDIOGRAM  03/19/2017   Normal    Family History  Problem Relation Age of Onset  . Stroke Mother 47  . Heart failure Father   . Heart disease Father   . Colon cancer Father 66  . Cancer Brother 41       lymphoma    Social History   Socioeconomic History  . Marital status: Single    Spouse name: Not on file  . Number  of children: Not on file  . Years of education: Not on file  . Highest education level: Not on file  Occupational History  . Not on file  Tobacco Use  . Smoking status: Former Smoker    Years: 35.00    Types: Cigarettes    Quit date: 12/12/2003    Years since quitting: 16.7  . Smokeless tobacco: Never Used  Vaping Use  . Vaping Use: Never used  Substance and Sexual Activity  . Alcohol use: No    Comment: 2005  . Drug use: No    Comment: past history of crystal meth addiction -clean since 2005   . Sexual activity: Not Currently    Comment: 2005  Other Topics Concern  . Not on file  Social History Narrative   Homosexual.   Never married.   Hx of methamph abuse; no IV drug use.  [facility] remission drug program, no use since 2005.   Exercises regularly, says he eats a good diet.   Tob: quit 2004, 50 pack-yr hx.   No alcohol.   Social Determinants of Health   Financial Resource Strain:   . Difficulty of Paying Living Expenses: Not on file  Food Insecurity:   .  Worried About Charity fundraiser in the Last Year: Not on file  . Ran Out of Food in the Last Year: Not on file  Transportation Needs:   . Lack of Transportation (Medical): Not on file  . Lack of Transportation (Non-Medical): Not on file  Physical Activity:   . Days of Exercise per Week: Not on file  . Minutes of Exercise per Session: Not on file  Stress:   . Feeling of Stress : Not on file  Social Connections:   . Frequency of Communication with Friends and Family: Not on file  . Frequency of Social Gatherings with Friends and Family: Not on file  . Attends Religious Services: Not on file  . Active Member of Clubs or Organizations: Not on file  . Attends Archivist Meetings: Not on file  . Marital Status: Not on file  Intimate Partner Violence:   . Fear of Current or Ex-Partner: Not on file  . Emotionally Abused: Not on file  . Physically Abused: Not on file  . Sexually Abused: Not on file     Outpatient Medications Prior to Visit  Medication Sig Dispense Refill  . albuterol (VENTOLIN HFA) 108 (90 Base) MCG/ACT inhaler Inhale 2 puffs into the lungs every 6 (six) hours as needed for wheezing or shortness of breath. 1 Inhaler 1  . aspirin 81 MG chewable tablet Chew 81 mg by mouth daily.      Marland Kitchen atorvastatin (LIPITOR) 40 MG tablet Take 1 tablet (40 mg total) by mouth daily. 90 tablet 3  . bictegravir-emtricitabine-tenofovir AF (BIKTARVY) 50-200-25 MG TABS tablet Take 1 tablet by mouth daily. 90 tablet 3  . losartan (COZAAR) 25 MG tablet Take 1 tablet (25 mg total) by mouth daily. 90 tablet 3   No facility-administered medications prior to visit.    No Known Allergies  ROS Review of Systems  Constitutional: Negative for appetite change, chills, fatigue and fever.  HENT: Negative for congestion, dental problem, ear pain and sore throat.   Eyes: Negative for discharge, redness and visual disturbance.  Respiratory: Negative for cough, chest tightness, shortness of breath and wheezing.   Cardiovascular: Negative for chest pain, palpitations and leg swelling.  Gastrointestinal: Negative for abdominal pain, blood in stool, diarrhea, nausea and vomiting.  Genitourinary: Negative for difficulty urinating, dysuria, flank pain, frequency, hematuria and urgency.  Musculoskeletal: Negative for arthralgias, back pain, joint swelling, myalgias and neck stiffness.  Skin: Negative for pallor and rash.  Neurological: Negative for dizziness, speech difficulty, weakness and headaches.  Hematological: Negative for adenopathy. Does not bruise/bleed easily.  Psychiatric/Behavioral: Negative for confusion and sleep disturbance. The patient is not nervous/anxious.     PE; Vitals with BMI 08/25/2020 06/11/2020 02/27/2020  Height 5' 8.504" 5\' 10"  5\' 10"   Weight 162 lbs 166 lbs 6 oz 170 lbs  BMI 24.27 16.10 96.04  Systolic 540 981 191  Diastolic 72 72 77  Pulse 49 2 53     Gen: Alert, well  appearing.  Patient is oriented to person, place, time, and situation. AFFECT: pleasant, lucid thought and speech. ENT: Ears: EACs clear, normal epithelium.  TMs with good light reflex and landmarks bilaterally.  Eyes: no injection, icteris, swelling, or exudate.  EOMI, PERRLA. Nose: no drainage or turbinate edema/swelling.  No injection or focal lesion.  Mouth: lips without lesion/swelling.  Oral mucosa pink and moist.  Dentition intact and without obvious caries or gingival swelling.  Oropharynx without erythema, exudate, or swelling.  Neck: supple/nontender.  No  LAD, mass, or TM.  Carotid pulses 2+ bilaterally, without bruits. CV: RRR, no m/r/g.   LUNGS: CTA bilat, nonlabored resps, good aeration in all lung fields. ABD: soft, NT, ND, BS normal.  No hepatospenomegaly or mass.  No bruits. EXT: no clubbing, cyanosis, or edema.  Musculoskeletal: no joint swelling, erythema, warmth, or tenderness.  ROM of all joints intact. Skin - no sores or suspicious lesions or rashes or color changes Rectal exam: negative without mass, lesions or tenderness, PROSTATE EXAM: smooth and symmetric without nodules or tenderness.   Pertinent labs:  Lab Results  Component Value Date   TSH 1.48 04/27/2017   Lab Results  Component Value Date   WBC 5.2 08/10/2020   HGB 13.7 08/10/2020   HCT 40.6 08/10/2020   MCV 92.1 08/10/2020   PLT 269 08/10/2020   Lab Results  Component Value Date   CREATININE 1.15 08/10/2020   BUN 20 08/10/2020   NA 139 08/10/2020   K 4.6 08/10/2020   CL 104 08/10/2020   CO2 30 08/10/2020   Lab Results  Component Value Date   ALT 32 08/10/2020   AST 23 08/10/2020   ALKPHOS 76 03/19/2017   BILITOT 0.7 08/10/2020   Lab Results  Component Value Date   CHOL 138 08/10/2020   Lab Results  Component Value Date   HDL 45 08/10/2020   Lab Results  Component Value Date   LDLCALC 78 08/10/2020   Lab Results  Component Value Date   TRIG 66 08/10/2020   Lab Results   Component Value Date   CHOLHDL 3.1 08/10/2020   Lab Results  Component Value Date   PSA 0.43 04/27/2017   PSA 0.46 03/15/2016    ASSESSMENT AND PLAN:   1) HTN: well controlled on losartan 25mg  qd. Recent lytes/cr 08/10/20 excellent.  2) HLD: tolerating atorva 40mg  qd. Recent lipid panel excellent and hepatic panel normal.  3) Health maintenance exam: Reviewed age and gender appropriate health maintenance issues (prudent diet, regular exercise, health risks of tobacco and excessive alcohol, use of seatbelts, fire alarms in home, use of sunscreen).  Also reviewed age and gender appropriate health screening as well as vaccine recommendations. Vaccines: All UTD including shingrix and covid 19.  Flu vaccine-> Labs: all UTD 08/10/20---normal.   Prostate ca screening: DRE , PSA. Colon ca screening: recall 01/2021.  An After Visit Summary was printed and given to the patient.  FOLLOW UP:  No follow-ups on file.  Signed:  Crissie Sickles, MD           08/25/2020

## 2020-08-25 NOTE — Telephone Encounter (Signed)
COVID-19 Pre-Screening Questions:08/25/20  Do you currently have a fever (>100 F), chills or unexplained body aches? NO  Are you currently experiencing new cough, shortness of breath, sore throat, runny nose? NO   Have you been in contact with someone that is currently pending confirmation of Covid19 testing or has been confirmed to have the [place] virus? NO  **If the patient answers NO to ALL questions -  advise the patient to please call the clinic before coming to the office should any symptoms develop.

## 2020-08-25 NOTE — Patient Instructions (Signed)

## 2020-08-26 ENCOUNTER — Encounter: Payer: Self-pay | Admitting: Internal Medicine

## 2020-08-26 ENCOUNTER — Ambulatory Visit (INDEPENDENT_AMBULATORY_CARE_PROVIDER_SITE_OTHER): Payer: BC Managed Care – PPO | Admitting: Internal Medicine

## 2020-08-26 VITALS — BP 125/70 | HR 58 | Wt 163.0 lb

## 2020-08-26 DIAGNOSIS — Z113 Encounter for screening for infections with a predominantly sexual mode of transmission: Secondary | ICD-10-CM

## 2020-08-26 DIAGNOSIS — Z23 Encounter for immunization: Secondary | ICD-10-CM

## 2020-08-26 DIAGNOSIS — Z21 Asymptomatic human immunodeficiency virus [HIV] infection status: Secondary | ICD-10-CM | POA: Diagnosis not present

## 2020-08-26 DIAGNOSIS — Z5181 Encounter for therapeutic drug level monitoring: Secondary | ICD-10-CM | POA: Diagnosis not present

## 2020-08-26 DIAGNOSIS — E7849 Other hyperlipidemia: Secondary | ICD-10-CM

## 2020-08-26 DIAGNOSIS — I2510 Atherosclerotic heart disease of native coronary artery without angina pectoris: Secondary | ICD-10-CM

## 2020-08-26 NOTE — Assessment & Plan Note (Signed)
Creat, LFTs wnl.  

## 2020-08-26 NOTE — Assessment & Plan Note (Addendum)
No issues.  Managed by Dr. Anitra Lauth

## 2020-08-26 NOTE — Assessment & Plan Note (Signed)
Screened negative 

## 2020-08-26 NOTE — Assessment & Plan Note (Addendum)
Flu shot today I discussed the evidence around the COVID booster, what is known and not known.  Potential pros and cons.

## 2020-08-26 NOTE — Assessment & Plan Note (Signed)
He continues to do well with Biktarvy, no changes and rtc in 1 year.

## 2020-08-26 NOTE — Progress Notes (Signed)
   Subjective:    Patient ID: Edwin Padilla, male    DOB: 1951/07/30, 69 y.o.   MRN: [id number]  HPI Here for follow up of HIV He continues on Biktarvy and no missed doses.  No side effects and still pleased with the regimen despite early hesitation.   No new issues. No associated rash. CD4 590 and viral load < 20.  Normal creat.  Asking about the COVID booster.  He continues running for exercise and no issues with that.     Review of Systems  Constitutional: Negative for fatigue.  Gastrointestinal: Negative for diarrhea.  Skin: Negative for rash.  Neurological: Negative for dizziness.       Objective:   Physical Exam Constitutional:      Appearance: He is well-developed.  Eyes:     General: No scleral icterus. Pulmonary:     Effort: Pulmonary effort is normal. No respiratory distress.  Skin:    Findings: No rash.  Neurological:     General: No focal deficit present.     Mental Status: He is alert.  Psychiatric:        Mood and Affect: Mood normal.    SH: former smoker       Assessment & Plan:

## 2020-09-17 ENCOUNTER — Ambulatory Visit (INDEPENDENT_AMBULATORY_CARE_PROVIDER_SITE_OTHER): Payer: BC Managed Care – PPO

## 2020-09-17 ENCOUNTER — Other Ambulatory Visit: Payer: Self-pay

## 2020-09-17 DIAGNOSIS — Z23 Encounter for immunization: Secondary | ICD-10-CM | POA: Diagnosis not present

## 2020-09-17 NOTE — Progress Notes (Signed)
   Covid-19 Vaccination Clinic  Name:  QUANTA ROHER    MRN: [id number] DOB: [birth year]  09/17/2020  Mr. Robitaille was observed post Covid-19 immunization for 15 minutes without incident. He was provided with Vaccine Information Sheet and instruction to access the V-Safe system.   Mr. Kathol was instructed to call 911 with any severe reactions post vaccine: Marland Kitchen Difficulty breathing  . Swelling of face and throat  . A fast heartbeat  . A bad rash all over body  . Dizziness and weakness     Beryle Flock, RN

## 2020-10-21 DIAGNOSIS — Z03818 Encounter for observation for suspected exposure to other biological agents ruled out: Secondary | ICD-10-CM | POA: Diagnosis not present

## 2020-11-21 NOTE — Progress Notes (Signed)
Cardiology Office Note:    Date:  11/23/2020   ID:  Edwin Padilla, DOB March 24, 1951, MRN [id number]  PCP:  Tammi Sou, MD  Cardiologist:  Sinclair Grooms, MD   Referring MD: Tammi Sou, MD   Chief Complaint  Patient presents with  . Coronary Artery Disease  . Hyperlipidemia  . Hypertension    History of Present Illness:    Edwin Padilla is a 69 y.o. male with a hx of CAD, 2004 CABG with LIMA to LAD and SVG to diagonal 1, hyperlipidemia, and treated HIV infection.  Edwin Padilla is doing well.  He exercises at least 5 days a week.  He does 3 miles in less than 30 minutes.  No discomfort, dyspnea, or change in exertional tolerance.  He had somewhat atypical pain in July.  He was seen by Dr. Caryl Comes and had a CT of the chest performed.  There was no evidence of pulmonary embolism.  There was evidence of prior coronary bypass grafting.  He also was felt to have moderate cardiomegaly.  The patient has concerns about whether his heart is enlarged.  We talked about the possibility that he has eccentric hypertrophy related to physical conditioning.    Past Medical History:  Diagnosis Date  . Allergy    Cat allergy  . Bilateral primary osteoarthritis of knee   . Coronary atherosclerosis of unspecified type of vessel, native or graft    CABG 07/2003; annual cardiology f/u (Dr. Pernell Dupre).  Stable as of 02/2017 f/u--echo 03/2017 showed normal LV fxn---ok to d/c ACE-I per cardiologist.  . HIV infection ([place]) Dx 02/2002   ID clinic w/ [facility].  . Hyperlipidemia   . 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 Mild intermittent asthma    As of 2020, pt has not used this in "years" but likes to keep it on hand in case he accidentally encounters cats  . Myocardial infarction ([place]) 2004  . Stress fracture of left hip 2020   running injury; sx's resolved with rest.  . Substance abuse ([place])    hx of    Past Surgical History:  Procedure Laterality Date  . CARDIOVASCULAR STRESS TEST   05/2016   No ischemia  . COLONOSCOPY  01/2011   Normal (recall 10 yrs)  . CORONARY ARTERY BYPASS GRAFT  2004  . HERNIA REPAIR     as infant  . INGUINAL HERNIA REPAIR Right 04/09/2013   Procedure: RIGHT HERNIA REPAIR INGUINAL ADULT;  Surgeon: Haywood Lasso, MD;  Location: [facility];  Service: General;  Laterality: Right;  . TONSILLECTOMY    . TRANSTHORACIC ECHOCARDIOGRAM  03/19/2017   Normal    Current Medications: Current Meds  Medication Sig  . albuterol (VENTOLIN HFA) 108 (90 Base) MCG/ACT inhaler Inhale 1-2 puffs into the lungs every 6 (six) hours as needed for wheezing or shortness of breath.  Marland Kitchen aspirin 81 MG chewable tablet Chew 81 mg by mouth daily.  Marland Kitchen atorvastatin (LIPITOR) 40 MG tablet Take 1 tablet (40 mg total) by mouth daily.  . bictegravir-emtricitabine-tenofovir AF (BIKTARVY) 50-200-25 MG TABS tablet Take 1 tablet by mouth daily.  Marland Kitchen losartan (COZAAR) 25 MG tablet Take 1 tablet (25 mg total) by mouth daily.     Allergies:   Lisinopril   Social History   Socioeconomic History  . Marital status: Single    Spouse name: Not on file  . Number of children: Not on file  . Years of  education: Not on file  . Highest education level: Not on file  Occupational History  . Not on file  Tobacco Use  . Smoking status: Former Smoker    Years: 35.00    Types: Cigarettes    Quit date: 12/12/2003    Years since quitting: 16.9  . Smokeless tobacco: Never Used  Vaping Use  . Vaping Use: Never used  Substance and Sexual Activity  . Alcohol use: No    Comment: 2005  . Drug use: No    Comment: past history of crystal meth addiction -clean since 2005   . Sexual activity: Not Currently    Comment: 2005  Other Topics Concern  . Not on file  Social History Narrative   Homosexual.   Never married.   Hx of methamph abuse; no IV drug use.  [place] remission drug program, no use since 2005.   Exercises regularly, says he eats a good diet.   Tob: quit 2004, 50  pack-yr hx.   No alcohol.   Social Determinants of Health   Financial Resource Strain: Not on file  Food Insecurity: Not on file  Transportation Needs: Not on file  Physical Activity: Not on file  Stress: Not on file  Social Connections: Not on file     Family History: The patient's family history includes Cancer (age of onset: 24) in his brother; Colon cancer (age of onset: 83) in his father; Heart disease in his father; Heart failure in his father; Stroke (age of onset: 72) in his mother.  ROS:   Please see the history of present illness.    Controlled HIV all other systems reviewed and are negative.  EKGs/Labs/Other Studies Reviewed:    The following studies were reviewed today: Chest CT 06/11/2020: IMPRESSION: No evidence of pulmonary embolus.  Prior CABG.  Mild cardiomegaly.  No acute cardiopulmonary disease.   EKG:  EKG performed in July revealed marked sinus bradycardia 40 bpm with early repole and first-degree AV block 240 ms.  Recent Labs: 08/10/2020: ALT 32; BUN 20; Creat 1.15; Hemoglobin 13.7; Platelets 269; Potassium 4.6; Sodium 139  Recent Lipid Panel    Component Value Date/Time   CHOL 138 08/10/2020 0854   CHOL 162 03/19/2017 0000   TRIG 66 08/10/2020 0854   HDL 45 08/10/2020 0854   HDL 58 03/19/2017 0000   CHOLHDL 3.1 08/10/2020 0854   VLDL 17 04/19/2016 0905   LDLCALC 78 08/10/2020 0854    Physical Exam:    VS:  BP 110/70   Pulse (!) 56   Ht 5\' 10"  (1.778 m)   Wt 160 lb 12.8 oz (72.9 kg)   SpO2 97%   BMI 23.07 kg/m     Wt Readings from Last 3 Encounters:  11/23/20 160 lb 12.8 oz (72.9 kg)  08/26/20 163 lb (73.9 kg)  08/25/20 162 lb (73.5 kg)     GEN: Healthy-appearing. No acute distress HEENT: Normal NECK: No JVD. LYMPHATICS: No lymphadenopathy CARDIAC:  RRR without murmur, but S4 gallop is present.  There is no edema. VASCULAR:  Normal Pulses. No bruits. RESPIRATORY:  Clear to auscultation without rales, wheezing or rhonchi   ABDOMEN: Soft, non-tender, non-distended, No pulsatile mass, MUSCULOSKELETAL: No deformity  SKIN: Warm and dry NEUROLOGIC:  Alert and oriented x 3 PSYCHIATRIC:  Normal affect   ASSESSMENT:    1. Coronary artery disease of bypass graft of native heart with stable angina pectoris ([place])   2. Other hyperlipidemia   3. Asymptomatic HIV infection ([place])  4. Cardiomegaly   5. Educated about COVID-19 virus infection    PLAN:    In order of problems listed above:  1. Secondary prevention discussed. 2. LDL is 78.  Target less than 70. 3. Continue Biktarvy. 4. 2D Doppler echocardiogram because of CT identified cardiomegaly.  Suspect eccentric hypertrophy due to running. 5. He is vaccinated x3.  No infection.  No issues.  Overall education and awareness concerning primary/secondary risk prevention was discussed in detail: LDL less than 70, hemoglobin A1c less than 7, blood pressure target less than 130/80 mmHg, >150 minutes of moderate aerobic activity per week, avoidance of smoking, weight control (via diet and exercise), and continued surveillance/management of/for obstructive sleep apnea.    Medication Adjustments/Labs and Tests Ordered: Current medicines are reviewed at length with the patient today.  Concerns regarding medicines are outlined above.  Orders Placed This Encounter  Procedures  . ECHOCARDIOGRAM COMPLETE   No orders of the defined types were placed in this encounter.   Patient Instructions  Medication Instructions:  Your physician recommends that you continue on your current medications as directed. Please refer to the Current Medication list given to you today.  *If you need a refill on your cardiac medications before your next appointment, please call your pharmacy*   Lab Work: None If you have labs (blood work) drawn today and your tests are completely normal, you will receive your results only by: Marland Kitchen MyChart Message (if you have MyChart) OR . A paper copy in  the mail If you have any lab test that is abnormal or we need to change your treatment, we will call you to review the results.   Testing/Procedures: Your physician has requested that you have an echocardiogram. Echocardiography is a painless test that uses sound waves to create images of your heart. It provides your doctor with information about the size and shape of your heart and how well your heart's chambers and valves are working. This procedure takes approximately one hour. There are no restrictions for this procedure.   Follow-Up: At [facility], you and your health needs are our priority.  As part of our continuing mission to provide you with exceptional heart care, we have created designated Provider Care Teams.  These Care Teams include your primary Cardiologist (physician) and Advanced Practice Providers (APPs -  Physician Assistants and Nurse Practitioners) who all work together to provide you with the care you need, when you need it.  We recommend signing up for the patient portal called "MyChart".  Sign up information is provided on this After Visit Summary.  MyChart is used to connect with patients for Virtual Visits (Telemedicine).  Patients are able to view lab/test results, encounter notes, upcoming appointments, etc.  Non-urgent messages can be sent to your provider as well.   To learn more about what you can do with MyChart, go to [email or web address].    Your next appointment:   12 month(s)  The format for your next appointment:   In Person  Provider:   You may see Sinclair Grooms, MD or one of the following Advanced Practice Providers on your designated Care Team:    Truitt Merle, NP  Cecilie Kicks, NP  Kathyrn Drown, NP    Other Instructions      Signed, Sinclair Grooms, MD  11/23/2020 2:10 PM    [place]

## 2020-11-23 ENCOUNTER — Other Ambulatory Visit: Payer: Self-pay

## 2020-11-23 ENCOUNTER — Ambulatory Visit (INDEPENDENT_AMBULATORY_CARE_PROVIDER_SITE_OTHER): Payer: BC Managed Care – PPO | Admitting: Interventional Cardiology

## 2020-11-23 ENCOUNTER — Encounter: Payer: Self-pay | Admitting: Interventional Cardiology

## 2020-11-23 VITALS — BP 110/70 | HR 56 | Ht 70.0 in | Wt 160.8 lb

## 2020-11-23 DIAGNOSIS — I25708 Atherosclerosis of coronary artery bypass graft(s), unspecified, with other forms of angina pectoris: Secondary | ICD-10-CM

## 2020-11-23 DIAGNOSIS — E7849 Other hyperlipidemia: Secondary | ICD-10-CM

## 2020-11-23 DIAGNOSIS — Z21 Asymptomatic human immunodeficiency virus [HIV] infection status: Secondary | ICD-10-CM

## 2020-11-23 DIAGNOSIS — Z7189 Other specified counseling: Secondary | ICD-10-CM

## 2020-11-23 DIAGNOSIS — I2510 Atherosclerotic heart disease of native coronary artery without angina pectoris: Secondary | ICD-10-CM

## 2020-11-23 DIAGNOSIS — I517 Cardiomegaly: Secondary | ICD-10-CM | POA: Diagnosis not present

## 2020-11-23 NOTE — Patient Instructions (Signed)
Medication Instructions:  Your physician recommends that you continue on your current medications as directed. Please refer to the Current Medication list given to you today.  *If you need a refill on your cardiac medications before your next appointment, please call your pharmacy*   Lab Work: None If you have labs (blood work) drawn today and your tests are completely normal, you will receive your results only by:  [place] (if you have MyChart) OR  A paper copy in the mail If you have any lab test that is abnormal or we need to change your treatment, we will call you to review the results.   Testing/Procedures: Your physician has requested that you have an echocardiogram. Echocardiography is a painless test that uses sound waves to create images of your heart. It provides your doctor with information about the size and shape of your heart and how well your hearts chambers and valves are working. This procedure takes approximately one hour. There are no restrictions for this procedure.   Follow-Up: At [facility], you and your health needs are our priority.  As part of our continuing mission to provide you with exceptional heart care, we have created designated Provider Care Teams.  These Care Teams include your primary Cardiologist (physician) and Advanced Practice Providers (APPs -  Physician Assistants and Nurse Practitioners) who all work together to provide you with the care you need, when you need it.  We recommend signing up for the patient portal called "MyChart".  Sign up information is provided on this After Visit Summary.  MyChart is used to connect with patients for Virtual Visits (Telemedicine).  Patients are able to view lab/test results, encounter notes, upcoming appointments, etc.  Non-urgent messages can be sent to your provider as well.   To learn more about what you can do with MyChart, go to [email or web address].    Your next appointment:   12  month(s)  The format for your next appointment:   In Person  Provider:   You may see Sinclair Grooms, MD or one of the following Advanced Practice Providers on your designated Care Team:    Truitt Merle, NP  Cecilie Kicks, NP  Kathyrn Drown, NP    Other Instructions

## 2020-12-11 DIAGNOSIS — R591 Generalized enlarged lymph nodes: Secondary | ICD-10-CM

## 2020-12-11 HISTORY — PX: TRANSESOPHAGEAL ECHOCARDIOGRAM: SHX273

## 2020-12-11 HISTORY — PX: OTHER SURGICAL HISTORY: SHX169

## 2020-12-11 HISTORY — DX: Generalized enlarged lymph nodes: R59.1

## 2020-12-16 ENCOUNTER — Ambulatory Visit (HOSPITAL_COMMUNITY): Payer: BC Managed Care – PPO | Attending: Internal Medicine

## 2020-12-16 ENCOUNTER — Other Ambulatory Visit: Payer: Self-pay

## 2020-12-16 DIAGNOSIS — I517 Cardiomegaly: Secondary | ICD-10-CM | POA: Insufficient documentation

## 2020-12-16 LAB — ECHOCARDIOGRAM COMPLETE
Area-P 1/2: 2.87 cm2
S' Lateral: 3.1 cm

## 2020-12-19 ENCOUNTER — Encounter: Payer: Self-pay | Admitting: Family Medicine

## 2020-12-20 ENCOUNTER — Other Ambulatory Visit: Payer: Self-pay | Admitting: Pharmacist

## 2020-12-20 DIAGNOSIS — Z21 Asymptomatic human immunodeficiency virus [HIV] infection status: Secondary | ICD-10-CM

## 2021-01-13 DIAGNOSIS — Z03818 Encounter for observation for suspected exposure to other biological agents ruled out: Secondary | ICD-10-CM | POA: Diagnosis not present

## 2021-01-31 NOTE — Progress Notes (Signed)
OFFICE VISIT  02/01/2021  CC:  Chief Complaint  Edwin Padilla presents with  . Swollen groin area    X 5 days   HPI:    Edwin Padilla is a 70 y.o. Caucasian male who presents for "swollen area in groin". He noted a painless L groin swelling in shower 5 d/a.  Not changing in size since he detected it. Thinks maybe he feels another small one now near it.  No penile d/c or dysuria. No abd pain, no rash or irritated region in abd or groin. No fatigue, no malaise, no abnl wt loss, no fevers or night sweats.  Hx R inguinal herniorrhaphy 2014, Dr. Margot Chimes.  Past Medical History:  Diagnosis Date  . Allergy    Cat allergy  . Bilateral primary osteoarthritis of knee   . Coronary atherosclerosis of unspecified type of vessel, native or graft    CABG 07/2003; annual cardiology f/u (Dr. Pernell Dupre).  Stable as of 02/2017 f/u--echo 03/2017 showed normal LV fxn---ok to d/c ACE-I per cardiologist.  . HIV infection ([place]) Dx 02/2002   ID clinic w/ [facility].  . Hyperlipidemia   . 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 Mild intermittent asthma    As of 2020, pt has not used this in "years" but likes to keep it on hand in case he accidentally encounters cats  . Myocardial infarction ([place]) 2004  . Stress fracture of left hip 2020   running injury; sx's resolved with rest.  . Substance abuse ([place])    hx of    Past Surgical History:  Procedure Laterality Date  . CARDIOVASCULAR STRESS TEST  05/2016   No ischemia  . COLONOSCOPY  01/2011   Normal (recall 10 yrs)  . CORONARY ARTERY BYPASS GRAFT  2004  . HERNIA REPAIR     as infant  . INGUINAL HERNIA REPAIR Right 04/09/2013   Procedure: RIGHT HERNIA REPAIR INGUINAL ADULT;  Surgeon: Haywood Lasso, MD;  Location: [facility];  Service: General;  Laterality: Right;  . TONSILLECTOMY    . TRANSESOPHAGEAL ECHOCARDIOGRAM  12/2020   normal (mild av thickening)  . TRANSTHORACIC ECHOCARDIOGRAM  03/19/2017   Normal   Family History  Problem  Relation Age of Onset  . Stroke Mother 50  . Heart failure Father   . Heart disease Father   . Colon cancer Father 66  . Cancer Brother 34       lymphoma    Outpatient Medications Prior to Visit  Medication Sig Dispense Refill  . albuterol (VENTOLIN HFA) 108 (90 Base) MCG/ACT inhaler Inhale 1-2 puffs into the lungs every 6 (six) hours as needed for wheezing or shortness of breath. 1 each 0  . aspirin 81 MG chewable tablet Chew 81 mg by mouth daily.    Marland Kitchen atorvastatin (LIPITOR) 40 MG tablet Take 1 tablet (40 mg total) by mouth daily. 90 tablet 3  . BIKTARVY 50-200-25 MG TABS tablet TAKE 1 TABLET BY MOUTH DAILY. 90 tablet 2  . losartan (COZAAR) 25 MG tablet Take 1 tablet (25 mg total) by mouth daily. 90 tablet 3   No facility-administered medications prior to visit.    Allergies  Allergen Reactions  . Lisinopril Cough    cough    ROS As per HPI  PE: Vitals with BMI 02/01/2021 11/23/2020 08/26/2020  Height 5\' 10"  5\' 10"  -  Weight 162 lbs 3 oz 160 lbs 13 oz 163 lbs  BMI 23.27 62.94 76.54  Systolic 650 354 656  Diastolic 65 70 70  Pulse 48 56 58   Gen: Alert, well appearing.  Edwin Padilla is oriented to person, place, time, and situation. AFFECT: pleasant, lucid thought and speech. No abd or groin rash or erythema or hair abnormality. A 2-3 cm rubbery, mobile, non-tender subQ nodule is palpable in L inguinal ligament region. Similar feeling but smaller nodule is palpable a few inches inferior to this in L groin. No erythema or induration.   LABS:  None today  IMPRESSION AND PLAN:  Left groin nodules, suspect lymph nodes.  Two are definitely palpable but it seems that there are a few other "shotty" type nodes palpable in same general vicinity. Overall suggestive of benign process but unknown etiology.   Will give this 1 month's time and if not spont resolved then will proceed with imaging (ultrasound, most likely) and blood work.   In the meantime he'll let me know if he starts  to get any progression of size or number of these OR if starts to feel fevers/night sweats, fatigue/malaise, or abnl wt loss.  He does have a FH of brother with lymphoma, but I feel it is premature to start pursuing workup given the Edwin Padilla's current clinical scenario.  An After Visit Summary was printed and given to the Edwin Padilla.  FOLLOW UP: Return in about 4 weeks (around 03/01/2021) for f/u groin adenopathy.  Signed:  Crissie Sickles, MD           02/01/2021

## 2021-02-01 ENCOUNTER — Other Ambulatory Visit: Payer: Self-pay

## 2021-02-01 ENCOUNTER — Ambulatory Visit (INDEPENDENT_AMBULATORY_CARE_PROVIDER_SITE_OTHER): Payer: BC Managed Care – PPO | Admitting: Family Medicine

## 2021-02-01 ENCOUNTER — Encounter: Payer: Self-pay | Admitting: Family Medicine

## 2021-02-01 VITALS — BP 102/65 | HR 48 | Temp 97.5°F | Resp 16 | Ht 70.0 in | Wt 162.2 lb

## 2021-02-01 DIAGNOSIS — R590 Localized enlarged lymph nodes: Secondary | ICD-10-CM | POA: Diagnosis not present

## 2021-02-15 ENCOUNTER — Other Ambulatory Visit: Payer: Self-pay | Admitting: Interventional Cardiology

## 2021-03-03 ENCOUNTER — Ambulatory Visit: Payer: BC Managed Care – PPO | Admitting: Family Medicine

## 2021-03-03 ENCOUNTER — Other Ambulatory Visit: Payer: Self-pay

## 2021-03-04 ENCOUNTER — Ambulatory Visit (INDEPENDENT_AMBULATORY_CARE_PROVIDER_SITE_OTHER): Payer: BC Managed Care – PPO | Admitting: Family Medicine

## 2021-03-04 ENCOUNTER — Encounter: Payer: Self-pay | Admitting: Family Medicine

## 2021-03-04 VITALS — BP 102/60 | HR 48 | Temp 97.4°F | Ht 70.0 in | Wt 162.8 lb

## 2021-03-04 DIAGNOSIS — R590 Localized enlarged lymph nodes: Secondary | ICD-10-CM

## 2021-03-04 DIAGNOSIS — R599 Enlarged lymph nodes, unspecified: Secondary | ICD-10-CM

## 2021-03-04 LAB — CBC WITH DIFFERENTIAL/PLATELET
Basophils Absolute: 0 10*3/uL (ref 0.0–0.1)
Basophils Relative: 0.7 % (ref 0.0–3.0)
Eosinophils Absolute: 0.2 10*3/uL (ref 0.0–0.7)
Eosinophils Relative: 3.3 % (ref 0.0–5.0)
HCT: 40.5 % (ref 39.0–52.0)
Hemoglobin: 13.9 g/dL (ref 13.0–17.0)
Lymphocytes Relative: 34.9 % (ref 12.0–46.0)
Lymphs Abs: 2.1 10*3/uL (ref 0.7–4.0)
MCHC: 34.5 g/dL (ref 30.0–36.0)
MCV: 91.2 fl (ref 78.0–100.0)
Monocytes Absolute: 0.6 10*3/uL (ref 0.1–1.0)
Monocytes Relative: 10.2 % (ref 3.0–12.0)
Neutro Abs: 3.1 10*3/uL (ref 1.4–7.7)
Neutrophils Relative %: 50.9 % (ref 43.0–77.0)
Platelets: 282 10*3/uL (ref 150.0–400.0)
RBC: 4.44 Mil/uL (ref 4.22–5.81)
RDW: 14.4 % (ref 11.5–15.5)
WBC: 6.1 10*3/uL (ref 4.0–10.5)

## 2021-03-04 LAB — COMPREHENSIVE METABOLIC PANEL
ALT: 23 U/L (ref 0–53)
AST: 17 U/L (ref 0–37)
Albumin: 4.3 g/dL (ref 3.5–5.2)
Alkaline Phosphatase: 51 U/L (ref 39–117)
BUN: 23 mg/dL (ref 6–23)
CO2: 29 mEq/L (ref 19–32)
Calcium: 9.6 mg/dL (ref 8.4–10.5)
Chloride: 103 mEq/L (ref 96–112)
Creatinine, Ser: 1.1 mg/dL (ref 0.40–1.50)
GFR: 68.46 mL/min (ref >60.00)
Glucose, Bld: 97 mg/dL (ref 70–99)
Potassium: 4.5 mEq/L (ref 3.5–5.1)
Sodium: 138 mEq/L (ref 135–145)
Total Bilirubin: 0.6 mg/dL (ref 0.2–1.2)
Total Protein: 7.5 g/dL (ref 6.0–8.3)

## 2021-03-04 LAB — C-REACTIVE PROTEIN: CRP: <1 mg/dL (ref 0.5–20.0)

## 2021-03-04 NOTE — Progress Notes (Signed)
OFFICE VISIT  03/04/2021  CC:  Chief Complaint  Patient presents with  . Medical Management of Chronic Issues    4 week f/u     HPI:    Patient is a 70 y.o. Caucasian male who presents for 1 mo f/u groin adenopathy. A/P as of last visit: "Left groin nodules, suspect lymph nodes.  Two are definitely palpable but it seems that there are a few other "shotty" type nodes palpable in same general vicinity. Overall suggestive of benign process but unknown etiology.   Will give this 1 month's time and if not spont resolved then will proceed with imaging (ultrasound, most likely) and blood work.   In the meantime he'll let me know if he starts to get any progression of size or number of these OR if starts to feel fevers/night sweats, fatigue/malaise, or abnl wt loss. He does have a FH of brother with lymphoma, but I feel it is premature to start pursuing workup given the patient's current clinical scenario."  INTERIM HX: Says no change in nodules in L groin. Says he feels very well.  Denies f/c/nightsweats or abnl wt loss. Appetite and energy level are good.    Past Medical History:  Diagnosis Date  . Allergy    Cat allergy  . Bilateral primary osteoarthritis of knee   . Coronary atherosclerosis of unspecified type of vessel, native or graft    CABG 07/2003; annual cardiology f/u (Dr. Pernell Dupre).  Stable as of 02/2017 f/u--echo 03/2017 showed normal LV fxn---ok to d/c ACE-I per cardiologist.  . HIV infection ([place]) Dx 02/2002   ID clinic w/ [facility].  . Hyperlipidemia   . 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 Mild intermittent asthma    As of 2020, pt has not used this in "years" but likes to keep it on hand in case he accidentally encounters cats  . Myocardial infarction ([place]) 2004  . Stress fracture of left hip 2020   running injury; sx's resolved with rest.  . Substance abuse ([place])    hx of    Past Surgical History:  Procedure Laterality Date  . CARDIOVASCULAR STRESS  TEST  05/2016   No ischemia  . COLONOSCOPY  01/2011   Normal (recall 10 yrs)  . CORONARY ARTERY BYPASS GRAFT  2004  . HERNIA REPAIR     as infant  . INGUINAL HERNIA REPAIR Right 04/09/2013   Procedure: RIGHT HERNIA REPAIR INGUINAL ADULT;  Surgeon: Haywood Lasso, MD;  Location: [place];  Service: General;  Laterality: Right;  . TONSILLECTOMY    . TRANSESOPHAGEAL ECHOCARDIOGRAM  12/2020   normal (mild av thickening)  . TRANSTHORACIC ECHOCARDIOGRAM  03/19/2017   Normal    Outpatient Medications Prior to Visit  Medication Sig Dispense Refill  . aspirin 81 MG chewable tablet Chew 81 mg by mouth daily.    Marland Kitchen atorvastatin (LIPITOR) 40 MG tablet Take 1 tablet (40 mg total) by mouth daily. 90 tablet 3  . BIKTARVY 50-200-25 MG TABS tablet TAKE 1 TABLET BY MOUTH DAILY. 90 tablet 2  . losartan (COZAAR) 25 MG tablet Take 1 tablet (25 mg total) by mouth daily. 90 tablet 2  . albuterol (VENTOLIN HFA) 108 (90 Base) MCG/ACT inhaler Inhale 1-2 puffs into the lungs every 6 (six) hours as needed for wheezing or shortness of breath. (Patient not taking: Reported on 03/04/2021) 1 each 0   No facility-administered medications prior to visit.    Allergies  Allergen Reactions  .  Lisinopril Cough    cough    ROS As per HPI  PE: Vitals with BMI 03/04/2021 02/01/2021 11/23/2020  Height _0  _1  _2   Weight 162 lbs 13 oz 162 lbs 3 oz 160 lbs 13 oz  BMI 23.36 43.27 61.47  Systolic 092 957 473  Diastolic 60 65 70  Pulse 48 48 56   Gen: Alert, well appearing.  Patient is oriented to person, place, time, and situation. AFFECT: pleasant, lucid thought and speech. No abd or groin rash or erythema or hair abnormality. A 2-3 cm rubbery, mobile, non-tender subQ nodule is palpable in L inguinal ligament region. Similar feeling but smaller nodule is palpable a few inches inferior to this in L groin. No erythema or induration.  LABS:    Chemistry      Component Value Date/Time    NA 139 08/10/2020 0854   K 4.6 08/10/2020 0854   CL 104 08/10/2020 0854   CO2 30 08/10/2020 0854   BUN 20 08/10/2020 0854   CREATININE 1.15 08/10/2020 0854      Component Value Date/Time   CALCIUM 9.8 08/10/2020 0854   ALKPHOS 76 03/19/2017 0000   AST 23 08/10/2020 0854   ALT 32 08/10/2020 0854   BILITOT 0.7 08/10/2020 0854   BILITOT 0.3 03/19/2017 0000     Lab Results  Component Value Date   WBC 5.2 08/10/2020   HGB 13.7 08/10/2020   HCT 40.6 08/10/2020   MCV 92.1 08/10/2020   PLT 269 08/10/2020   Lab Results  Component Value Date   TSH 1.48 04/27/2017   Lab Results  Component Value Date   CHOL 138 08/10/2020   HDL 45 08/10/2020   LDLCALC 78 08/10/2020   TRIG 66 08/10/2020   CHOLHDL 3.1 08/10/2020    IMPRESSION AND PLAN:  L inguinal region nodules, suspect mild lymphadenopathy. Unknown etiology---no change in the last 5 wks with obs. Pt with brother who had "atypical form of NHL" per pt report. Plan: check cbc, cmet, esr, crp. Check soft tissue u/s of the region. Refer to oncology for further expert eval/recommendations.  An After Visit Summary was printed and given to the patient.  FOLLOW UP: Return for f/u to be determined based on results of tests and specialist eval. Next cpe 6 mo  Signed:  Crissie Sickles, MD           03/04/2021

## 2021-03-05 LAB — SEDIMENTATION RATE: Sed Rate: 33 mm/h — ABNORMAL HIGH (ref 0–20)

## 2021-03-07 ENCOUNTER — Encounter: Payer: Self-pay | Admitting: *Deleted

## 2021-03-07 NOTE — Progress Notes (Signed)
Reached out to [place] to introduce myself as the office RN Navigator and explain our new patient process. Reviewed the reason for their referral and scheduled their new patient appointment along with labs. Provided address and directions to the office including call back phone number. Reviewed with patient any concerns they may have or any possible barriers to attending their appointment.   Oncology Nurse Navigator Documentation  Oncology Nurse Navigator Flowsheets 03/07/2021  Navigator Follow Up Date: 03/11/2021  Navigator Follow Up Reason: New Patient Appointment  Navigator Location [facility]  Referral Date to RadOnc/MedOnc 03/04/2021  Navigator Encounter Type Introductory Phone Call  Patient Visit Type MedOnc  Treatment Phase Abnormal Scans  Barriers/Navigation Needs Coordination of Care;Education  Education Other  Interventions Coordination of Care;Education  Acuity Level 2-Minimal Needs (1-2 Barriers Identified)  Coordination of Care Appts  Education Method Verbal  Time Spent with Patient 45

## 2021-03-09 ENCOUNTER — Other Ambulatory Visit: Payer: BC Managed Care – PPO

## 2021-03-09 ENCOUNTER — Ambulatory Visit: Payer: BC Managed Care – PPO | Admitting: Hematology & Oncology

## 2021-03-11 ENCOUNTER — Ambulatory Visit (HOSPITAL_BASED_OUTPATIENT_CLINIC_OR_DEPARTMENT_OTHER)
Admission: RE | Admit: 2021-03-11 | Discharge: 2021-03-11 | Disposition: A | Discharge status: Home / Self Care | Payer: BC Managed Care – PPO | Source: Ambulatory Visit | Attending: Family Medicine | Admitting: Family Medicine

## 2021-03-11 ENCOUNTER — Other Ambulatory Visit: Payer: Self-pay

## 2021-03-11 ENCOUNTER — Encounter: Payer: Self-pay | Admitting: Hematology & Oncology

## 2021-03-11 ENCOUNTER — Inpatient Hospital Stay (HOSPITAL_BASED_OUTPATIENT_CLINIC_OR_DEPARTMENT_OTHER): Payer: BC Managed Care – PPO | Admitting: Hematology & Oncology

## 2021-03-11 ENCOUNTER — Encounter: Payer: Self-pay | Admitting: *Deleted

## 2021-03-11 ENCOUNTER — Inpatient Hospital Stay: Payer: BC Managed Care – PPO | Attending: Hematology & Oncology

## 2021-03-11 VITALS — BP 97/68 | HR 44 | Temp 97.8°F | Resp 16 | Wt 163.0 lb

## 2021-03-11 DIAGNOSIS — Z21 Asymptomatic human immunodeficiency virus [HIV] infection status: Secondary | ICD-10-CM

## 2021-03-11 DIAGNOSIS — R590 Localized enlarged lymph nodes: Secondary | ICD-10-CM | POA: Diagnosis not present

## 2021-03-11 DIAGNOSIS — Z79899 Other long term (current) drug therapy: Secondary | ICD-10-CM | POA: Insufficient documentation

## 2021-03-11 DIAGNOSIS — E785 Hyperlipidemia, unspecified: Secondary | ICD-10-CM | POA: Insufficient documentation

## 2021-03-11 DIAGNOSIS — I2510 Atherosclerotic heart disease of native coronary artery without angina pectoris: Secondary | ICD-10-CM | POA: Diagnosis not present

## 2021-03-11 DIAGNOSIS — Z806 Family history of leukemia: Secondary | ICD-10-CM | POA: Diagnosis not present

## 2021-03-11 DIAGNOSIS — R599 Enlarged lymph nodes, unspecified: Secondary | ICD-10-CM

## 2021-03-11 DIAGNOSIS — B20 Human immunodeficiency virus [HIV] disease: Secondary | ICD-10-CM | POA: Diagnosis not present

## 2021-03-11 DIAGNOSIS — Z87891 Personal history of nicotine dependence: Secondary | ICD-10-CM | POA: Insufficient documentation

## 2021-03-11 DIAGNOSIS — I252 Old myocardial infarction: Secondary | ICD-10-CM | POA: Diagnosis not present

## 2021-03-11 DIAGNOSIS — Z7982 Long term (current) use of aspirin: Secondary | ICD-10-CM | POA: Diagnosis not present

## 2021-03-11 LAB — CBC WITH DIFFERENTIAL (CANCER CENTER ONLY)
Abs Immature Granulocytes: 0.03 10*3/uL (ref 0.00–0.07)
Basophils Absolute: 0 10*3/uL (ref 0.0–0.1)
Basophils Relative: 0 %
Eosinophils Absolute: 0.2 10*3/uL (ref 0.0–0.5)
Eosinophils Relative: 2 %
HCT: 38.5 % — ABNORMAL LOW (ref 39.0–52.0)
Hemoglobin: 13.2 g/dL (ref 13.0–17.0)
Immature Granulocytes: 1 %
Lymphocytes Relative: 30 %
Lymphs Abs: 2 10*3/uL (ref 0.7–4.0)
MCH: 30.8 pg (ref 26.0–34.0)
MCHC: 34.3 g/dL (ref 30.0–36.0)
MCV: 90 fL (ref 80.0–100.0)
Monocytes Absolute: 0.7 10*3/uL (ref 0.1–1.0)
Monocytes Relative: 10 %
Neutro Abs: 3.8 10*3/uL (ref 1.7–7.7)
Neutrophils Relative %: 57 %
Platelet Count: 270 10*3/uL (ref 150–400)
RBC: 4.28 MIL/uL (ref 4.22–5.81)
RDW: 13.6 % (ref 11.5–15.5)
WBC Count: 6.6 10*3/uL (ref 4.0–10.5)
nRBC: 0.3 % — ABNORMAL HIGH (ref 0.0–0.2)

## 2021-03-11 LAB — CMP (CANCER CENTER ONLY)
ALT: 22 U/L (ref 0–44)
AST: 17 U/L (ref 15–41)
Albumin: 3.9 g/dL (ref 3.5–5.0)
Alkaline Phosphatase: 43 U/L (ref 38–126)
Anion gap: 5 (ref 5–15)
BUN: 22 mg/dL (ref 8–23)
CO2: 30 mmol/L (ref 22–32)
Calcium: 9.4 mg/dL (ref 8.9–10.3)
Chloride: 104 mmol/L (ref 98–111)
Creatinine: 1.09 mg/dL (ref 0.61–1.24)
GFR, Estimated: >60 mL/min (ref >60)
Glucose, Bld: 91 mg/dL (ref 70–99)
Potassium: 4.5 mmol/L (ref 3.5–5.1)
Sodium: 139 mmol/L (ref 135–145)
Total Bilirubin: 0.7 mg/dL (ref 0.3–1.2)
Total Protein: 7.1 g/dL (ref 6.5–8.1)

## 2021-03-11 LAB — SAVE SMEAR(SSMR), FOR PROVIDER SLIDE REVIEW

## 2021-03-11 LAB — LACTATE DEHYDROGENASE: LDH: 107 U/L (ref 98–192)

## 2021-03-11 NOTE — Progress Notes (Signed)
Initial RN Navigator Patient Visit  Name: Edwin Padilla Date of Referral : 03/04/2021 Diagnosis: Inguinal Adenopathy  Met with patient prior to their visit with MD. [name] patient "Your Patient Navigator" handout which explains my role, areas in which I am able to help, and all the contact information for myself and the office. Also gave patient MD and Navigator business card. Reviewed with patient the general overview of expected course after initial diagnosis and time frame for all steps to be completed.  Patient completed visit with Dr. Marin Olp.   He will need CT scans prior to ordering an excisional biopsy. CT scheduled for 03/18/2021.  Patient understands all follow up procedures and expectations. They have my number to reach out for any further clarification or additional needs.  Oncology Nurse Navigator Documentation  Oncology Nurse Navigator Flowsheets 03/11/2021  Abnormal Finding Date 02/01/2021  Diagnosis Status Additional Work Up  Navigator Follow Up Date: 03/18/2021  Navigator Follow Up Reason: Scan Review  Navigator Location [facility]  Referral Date to RadOnc/MedOnc -  Navigator Encounter Type Initial MedOnc  Patient Visit Type MedOnc  Treatment Phase Abnormal Scans  Barriers/Navigation Needs Coordination of Care;Education  Education Other  Interventions Education;Psycho-Social Support  Acuity Level 2-Minimal Needs (1-2 Barriers Identified)  Coordination of Care Radiology  Education Method Verbal;Written  Support Groups/Services Friends and Family  Time Spent with Patient 44

## 2021-03-11 NOTE — Progress Notes (Signed)
Referral MD  Reason for Referral: Inguinal lymphadenopathy-etiology unclear  Chief Complaint  Patient presents with  . New Patient (Initial Visit)  : I have swollen lymph nodes in my left groin.  HPI: Edwin Padilla is a very interesting 70 year old white male.  He is originally from [place].  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  He got burned out from his job.  As such, he consequently moved down to [place].  While up in [place], he did contract HIV.  He did not have any AIDS defining illnesses.  He has been on [employer].  He is incredibly fit.  He exercises all the time.  He runs 3 miles a day.  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  He is followed by Dr. Anitra Lauth for general medicine.  He is also followed by Dr. Pernell Dupre.  He has had a bypass.  He had this back in 2005.  Bowel 5 weeks ago, he noted some swollen lymph nodes in the left groin.  There is no trauma that he had to the abdominal or pelvic area.  He had no change in bowel or bladder habits.  He had no fever or sweats.  There is no weight loss.  His HIV is followed by Dr. Scharlene Gloss.  Apparently this is under very good control.  There is cancer in the family.  There is history of lymphoma with a brother who died of this.  He was kindly referred to the [facility] for an evaluation.  He has had no problems with the coronavirus.  He has had no cough or shortness of breath.  He has not noted any swollen lymph nodes elsewhere.  Of note, he had a CT angiogram of his chest back in July 2021.  This was unremarkable.  There is no adenopathy noted in the chest or axilla or lower neck.  He does not smoke.  He was a former smoker.  He stopped back in 2005.  He has about 35-pack-year tobacco use.  He does not drink.  There is a past history of recreational drug use.  Again he stopped back in 2005.  He has not noted any rashes.  Overall, I would say his  performance status is ECOG 0.    Past Medical History:  Diagnosis Date  . Allergy    Cat allergy  . Bilateral primary osteoarthritis of knee   . Coronary atherosclerosis of unspecified type of vessel, native or graft    CABG 07/2003; annual cardiology f/u (Dr. Pernell Dupre).  Stable as of 02/2017 f/u--echo 03/2017 showed normal LV fxn---ok to d/c ACE-I per cardiologist.  . HIV infection ([place] [facility]) Dx 02/2002   ID clinic w/ [facility].  . Hyperlipidemia   . 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 Mild intermittent asthma    As of 2020, pt has not used this in "years" but likes to keep it on hand in case he accidentally encounters cats  . Myocardial infarction ([facility]) 2004  . Stress fracture of left hip 2020   running injury; sx's resolved with rest.  . Substance abuse ([facility])    hx of  :  Past Surgical History:  Procedure Laterality Date  . CARDIOVASCULAR STRESS TEST  05/2016   No ischemia  . COLONOSCOPY  01/2011   Normal (recall 10 yrs)  . CORONARY ARTERY BYPASS GRAFT  2004  . HERNIA REPAIR  as infant  . INGUINAL HERNIA REPAIR Right 04/09/2013   Procedure: RIGHT HERNIA REPAIR INGUINAL ADULT;  Surgeon: Haywood Lasso, MD;  Location: [facility];  Service: General;  Laterality: Right;  . TONSILLECTOMY    . TRANSESOPHAGEAL ECHOCARDIOGRAM  12/2020   normal (mild av thickening)  . TRANSTHORACIC ECHOCARDIOGRAM  03/19/2017   Normal  :   Current Outpatient Medications:  .  albuterol (VENTOLIN HFA) 108 (90 Base) MCG/ACT inhaler, Inhale 1-2 puffs into the lungs every 6 (six) hours as needed for wheezing or shortness of breath. (Patient not taking: Reported on 03/04/2021), Disp: 1 each, Rfl: 0 .  aspirin 81 MG chewable tablet, Chew 81 mg by mouth daily., Disp: , Rfl:  .  atorvastatin (LIPITOR) 40 MG tablet, Take 1 tablet (40 mg total) by mouth daily., Disp: 90 tablet, Rfl: 3 .  BIKTARVY 50-200-25 MG TABS tablet, TAKE 1 TABLET BY MOUTH DAILY., Disp: 90 tablet, Rfl: 2 .   losartan (COZAAR) 25 MG tablet, Take 1 tablet (25 mg total) by mouth daily., Disp: 90 tablet, Rfl: 2:  :  Allergies  Allergen Reactions  . Lisinopril Cough    cough  :  Family History  Problem Relation Age of Onset  . Stroke Mother 31  . Heart failure Father   . Heart disease Father   . Colon cancer Father 52  . Cancer Brother 16       lymphoma  :  Social History   Socioeconomic History  . Marital status: Single    Spouse name: Not on file  . Number of children: Not on file  . Years of education: Not on file  . Highest education level: Not on file  Occupational History  . Not on file  Tobacco Use  . Smoking status: Former Smoker    Years: 35.00    Types: Cigarettes    Quit date: 12/12/2003    Years since quitting: 17.2  . Smokeless tobacco: Never Used  Vaping Use  . Vaping Use: Never used  Substance and Sexual Activity  . Alcohol use: No    Comment: 2005  . Drug use: No    Comment: past history of crystal meth addiction -clean since 2005   . Sexual activity: Not Currently    Comment: 2005  Other Topics Concern  . Not on file  Social History Narrative   Homosexual.   Never married.   Hx of methamph abuse; no IV drug use.  [place] remission drug program, no use since 2005.   Exercises regularly, says he eats a good diet.   Tob: quit 2004, 50 pack-yr hx.   No alcohol.   Social Determinants of Health   Financial Resource Strain: Not on file  Food Insecurity: Not on file  Transportation Needs: Not on file  Physical Activity: Not on file  Stress: Not on file  Social Connections: Not on file  Intimate Partner Violence: Not on file  :  Review of Systems  Constitutional: Negative.   HENT: Negative.   Eyes: Negative.   Respiratory: Negative.   Cardiovascular: Negative.   Gastrointestinal: Negative.   Genitourinary: Negative.   Musculoskeletal: Negative.   Skin: Negative.   Neurological: Negative.   Endo/Heme/Allergies: Negative.    Psychiatric/Behavioral: Negative.      Exam: This is a well-developed and well-nourished white male in no obvious distress.  Vital signs are temperature of 97.8.  Pulse 45.  Blood pressure 97/68.  Weight is 163 pounds.  Head and neck exam  shows no ocular or oral lesions.  There is no scleral icterus.  He has no adenopathy in the cervical or supraclavicular regions.  Lungs are clear bilaterally.  Cardiac exam regular rate and rhythm with no murmurs, rubs or bruits.  Axillary exam does show some small axillary lymph nodes bilaterally.  These lymph nodes probably measure less than 1 cm.  They are nontender and slightly mobile.  Abdomen is soft.  He has good bowel sounds.  There is no fluid wave.  There is no palpable liver or spleen tip.  He does have bilateral inguinal adenopathy.  The adenopathy is more prominent in the left groin.  There is a palpable lymph node that probably measures about 1.5 cm.  Back exam shows no tenderness over the spine, ribs or hips.  Extremities shows no clubbing, cyanosis or edema.  He has good range of motion of his joints.  Neurological exam shows no focal neurological deficits.  Skin exam shows no rashes, ecchymoses or petechia.  @IPVITALS @   Recent Labs    03/11/21 1100  WBC 6.6  HGB 13.2  HCT 38.5*  PLT 270   Recent Labs    03/11/21 1100  NA 139  K 4.5  CL 104  CO2 30  GLUCOSE 91  BUN 22  CREATININE 1.09  CALCIUM 9.4    Blood smear review: None  Pathology: None    Assessment and Plan: Edwin Padilla is a really nice 70 year old male.  He has adenopathy bilaterally in the inguinal regions.  He has also some bilateral axillary lymph nodes.  It is really hard to say what might be going on.  It is possible these lymph nodes are reactive.  I would not think that the HIV has anything to do with this.  I would have to imagine that his HIV is under very good control.  I would think that he has an adequate CD4 count.  I think a CT scan is going to be  necessary.  We will see about setting up a CT scan of his neck and body.  We will see what this shows with respect to adenopathy.  He had the CT angiogram done back in July 2021 which did not show any obvious adenopathy in the chest.  Ultimately, we are going to need a biopsy.  I would prefer an excisional lymph node biopsy so that we have enough material for all of our studies.  Edwin Padilla is incredibly fun to talk to.  He is incredibly eloquent.  He has a lot of knowledge and has a lot of experiences.  I spent a good hour with him.  I went over my recommendations.  He is in agreement.  He is quite busy.  I do not want to interfere with his work life for his social life.  It sounds like he will be going up to [place] I think in May.  We will try to get the CT scan done next week.  If we see a problem with respect to adenopathy, then I will refer to surgery for an excisional biopsy.

## 2021-03-12 LAB — IGG, IGA, IGM
IgA: 168 mg/dL (ref 61–437)
IgG (Immunoglobin G), Serum: 502 mg/dL — ABNORMAL LOW (ref 603–1613)
IgM (Immunoglobulin M), Srm: 1814 mg/dL — ABNORMAL HIGH (ref 20–172)

## 2021-03-13 ENCOUNTER — Encounter: Payer: Self-pay | Admitting: Family Medicine

## 2021-03-14 ENCOUNTER — Telehealth: Payer: Self-pay | Admitting: Hematology & Oncology

## 2021-03-14 NOTE — Telephone Encounter (Signed)
NO los 4/1

## 2021-03-18 ENCOUNTER — Encounter (HOSPITAL_COMMUNITY): Payer: Self-pay

## 2021-03-18 ENCOUNTER — Other Ambulatory Visit: Payer: Self-pay

## 2021-03-18 ENCOUNTER — Ambulatory Visit (HOSPITAL_COMMUNITY)
Admission: RE | Admit: 2021-03-18 | Discharge: 2021-03-18 | Disposition: A | Discharge status: Home / Self Care | Payer: BC Managed Care – PPO | Source: Ambulatory Visit | Attending: Hematology & Oncology | Admitting: Hematology & Oncology

## 2021-03-18 DIAGNOSIS — M47816 Spondylosis without myelopathy or radiculopathy, lumbar region: Secondary | ICD-10-CM | POA: Diagnosis not present

## 2021-03-18 DIAGNOSIS — K862 Cyst of pancreas: Secondary | ICD-10-CM | POA: Diagnosis not present

## 2021-03-18 DIAGNOSIS — J432 Centrilobular emphysema: Secondary | ICD-10-CM | POA: Diagnosis not present

## 2021-03-18 DIAGNOSIS — R599 Enlarged lymph nodes, unspecified: Secondary | ICD-10-CM | POA: Insufficient documentation

## 2021-03-18 DIAGNOSIS — K429 Umbilical hernia without obstruction or gangrene: Secondary | ICD-10-CM | POA: Diagnosis not present

## 2021-03-18 DIAGNOSIS — R590 Localized enlarged lymph nodes: Secondary | ICD-10-CM | POA: Diagnosis not present

## 2021-03-18 DIAGNOSIS — N400 Enlarged prostate without lower urinary tract symptoms: Secondary | ICD-10-CM | POA: Diagnosis not present

## 2021-03-18 DIAGNOSIS — I2510 Atherosclerotic heart disease of native coronary artery without angina pectoris: Secondary | ICD-10-CM | POA: Diagnosis not present

## 2021-03-18 MED ORDER — IOHEXOL 300 MG/ML  SOLN
100.0000 mL | Freq: Once | INTRAMUSCULAR | Status: AC | PRN
  Administered 2021-03-18: 100 mL via INTRAVENOUS

## 2021-03-21 ENCOUNTER — Encounter: Payer: Self-pay | Admitting: *Deleted

## 2021-03-21 NOTE — Progress Notes (Signed)
Reviewed patient's CT scans with Dr Marin Olp. He will call patient with results and determine plan from there. Will follow up for additional needs at that time.   Oncology Nurse Navigator Documentation  Oncology Nurse Navigator Flowsheets 03/21/2021  Abnormal Finding Date -  Diagnosis Status -  Navigator Follow Up Date: 03/23/2021  Navigator Follow Up Reason: Appointment Review  Navigator Location [facility]  Referral Date to RadOnc/MedOnc -  Navigator Encounter Type Scan Review  Patient Visit Type MedOnc  Treatment Phase Abnormal Scans  Barriers/Navigation Needs Coordination of Care;Education  Education -  Interventions None Required  Acuity Level 2-Minimal Needs (1-2 Barriers Identified)  Coordination of Care -  Education Method -  Support Groups/Services Friends and Family  Time Spent with Patient 15

## 2021-03-24 ENCOUNTER — Encounter: Payer: Self-pay | Admitting: *Deleted

## 2021-03-28 ENCOUNTER — Encounter: Payer: Self-pay | Admitting: *Deleted

## 2021-03-28 DIAGNOSIS — R599 Enlarged lymph nodes, unspecified: Secondary | ICD-10-CM

## 2021-03-28 NOTE — Progress Notes (Signed)
Dr Marin Olp has spoken to patient, and he would like him to proceed with excisional biopsy. He will speak to Dr Ninfa Linden. Referral order placed.   Oncology Nurse Navigator Documentation  Oncology Nurse Navigator Flowsheets 03/28/2021  Abnormal Finding Date -  Diagnosis Status -  Navigator Follow Up Date: 04/01/2021  Navigator Follow Up Reason: Appointment Review  Navigator Location [facility]  Referral Date to RadOnc/MedOnc -  Navigator Encounter Type Appt/Treatment Plan Review  Patient Visit Type MedOnc  Treatment Phase Abnormal Scans  Barriers/Navigation Needs Coordination of Care;Education  Education -  Interventions Referrals  Acuity Level 2-Minimal Needs (1-2 Barriers Identified)  Referrals Other  Coordination of Care -  Education Method -  Support Groups/Services Friends and Family  Time Spent with Patient 15

## 2021-04-01 ENCOUNTER — Encounter: Payer: Self-pay | Admitting: *Deleted

## 2021-04-01 NOTE — Progress Notes (Signed)
Called [facility]. Patient has been scheduled with Dr Ninfa Linden on 04/05/21.  Oncology Nurse Navigator Documentation  Oncology Nurse Navigator Flowsheets 04/01/2021  Abnormal Finding Date -  Diagnosis Status -  Navigator Follow Up Date: 04/05/2021  Navigator Follow Up Reason: Appointment Review  Navigator Location [facility]  Referral Date to RadOnc/MedOnc -  Navigator Encounter Type Appt/Treatment Plan Review  Patient Visit Type MedOnc  Treatment Phase Abnormal Scans  Barriers/Navigation Needs Coordination of Care;Education  Education -  Interventions Coordination of Care  Acuity Level 2-Minimal Needs (1-2 Barriers Identified)  Referrals -  Coordination of Care Other  Education Method -  Support Groups/Services Friends and Family  Time Spent with Patient 15

## 2021-04-05 ENCOUNTER — Other Ambulatory Visit: Payer: Self-pay | Admitting: Surgery

## 2021-04-05 ENCOUNTER — Encounter: Payer: Self-pay | Admitting: *Deleted

## 2021-04-05 DIAGNOSIS — R590 Localized enlarged lymph nodes: Secondary | ICD-10-CM | POA: Diagnosis not present

## 2021-04-05 NOTE — Progress Notes (Signed)
Patient was seen by Dr Ninfa Linden this morning. An excisional biopsy will be scheduled. With follow up later this week for date of surgery.  Oncology Nurse Navigator Documentation  Oncology Nurse Navigator Flowsheets 04/05/2021  Abnormal Finding Date -  Diagnosis Status -  Navigator Follow Up Date: 04/08/2021  Navigator Follow Up Reason: Appointment Review  Navigator Location [facility]  Referral Date to RadOnc/MedOnc -  Navigator Encounter Type Appt/Treatment Plan Review  Patient Visit Type MedOnc  Treatment Phase Abnormal Scans  Barriers/Navigation Needs Coordination of Care  Education -  Interventions None Required  Acuity Level 2-Minimal Needs (1-2 Barriers Identified)  Referrals -  Coordination of Care -  Education Method -  Support Groups/Services Friends and Family  Time Spent with Patient 15

## 2021-04-12 ENCOUNTER — Encounter: Payer: Self-pay | Admitting: *Deleted

## 2021-04-12 NOTE — Progress Notes (Signed)
Patient is scheduled for biopsy on 05/12/2021. Updated Dr Marin Olp. Dr Marin Olp would like to know why patient's biopy is scheduled so far out, and would like it rescheduled to a sooner date.  Reached out to Dr Trevor Mace office. Will continue to follow for possibility of earlier appointment.  Oncology Nurse Navigator Documentation  Oncology Nurse Navigator Flowsheets 04/12/2021  Abnormal Finding Date -  Diagnosis Status -  Navigator Follow Up Date: 05/12/2021  Navigator Follow Up Reason: Surgery  Navigator Location [facility]  Referral Date to RadOnc/MedOnc -  Navigator Encounter Type Appt/Treatment Plan Review  Patient Visit Type MedOnc  Treatment Phase Abnormal Scans  Barriers/Navigation Needs Coordination of Care  Education -  Interventions Coordination of Care  Acuity Level 2-Minimal Needs (1-2 Barriers Identified)  Referrals -  Coordination of Care Other  Education Method -  Support Groups/Services Friends and Family  Time Spent with Patient 30

## 2021-04-20 ENCOUNTER — Encounter: Payer: Self-pay | Admitting: *Deleted

## 2021-04-20 NOTE — Progress Notes (Signed)
Received communication from surgeon's office regarding our request to have biopsy done sooner than 05/12/2021. They reached out to patient but he declined rescheduling. Will follow up at that time for path.  Oncology Nurse Navigator Documentation  Oncology Nurse Navigator Flowsheets 04/20/2021  Abnormal Finding Date -  Diagnosis Status -  Navigator Follow Up Date: 05/12/2021  Navigator Follow Up Reason: Surgery  Navigator Location [facility]  Referral Date to RadOnc/MedOnc -  Navigator Encounter Type Appt/Treatment Plan Review  Patient Visit Type MedOnc  Treatment Phase Abnormal Scans  Barriers/Navigation Needs Coordination of Care  Education -  Interventions Coordination of Care  Acuity Level 2-Minimal Needs (1-2 Barriers Identified)  Referrals -  Coordination of Care Other  Education Method -  Support Groups/Services Friends and Family  Time Spent with Patient 30

## 2021-04-21 ENCOUNTER — Other Ambulatory Visit: Payer: Self-pay

## 2021-04-21 ENCOUNTER — Ambulatory Visit (INDEPENDENT_AMBULATORY_CARE_PROVIDER_SITE_OTHER): Payer: BC Managed Care – PPO

## 2021-04-21 DIAGNOSIS — Z23 Encounter for immunization: Secondary | ICD-10-CM | POA: Diagnosis not present

## 2021-04-21 NOTE — Progress Notes (Signed)
   Covid-19 Vaccination Clinic  Name:  Edwin Padilla    MRN: [id number] DOB: Jul 29, 1951  04/21/2021  Edwin Padilla was observed post Covid-19 immunization for 15 minutes without incident. He was provided with Vaccine Information Sheet and instruction to access the V-Safe system.   Edwin Padilla was instructed to call 911 with any severe reactions post vaccine: Marland Kitchen Difficulty breathing  . Swelling of face and throat  . A fast heartbeat  . A bad rash all over body  . Dizziness and weakness  Leatrice Jewels, RMA

## 2021-05-03 ENCOUNTER — Encounter (HOSPITAL_BASED_OUTPATIENT_CLINIC_OR_DEPARTMENT_OTHER): Payer: Self-pay | Admitting: Surgery

## 2021-05-03 ENCOUNTER — Other Ambulatory Visit: Payer: Self-pay

## 2021-05-05 ENCOUNTER — Telehealth: Payer: Self-pay

## 2021-05-05 ENCOUNTER — Encounter: Payer: Self-pay | Admitting: *Deleted

## 2021-05-05 ENCOUNTER — Other Ambulatory Visit: Payer: Self-pay | Admitting: Internal Medicine

## 2021-05-05 ENCOUNTER — Encounter: Payer: Self-pay | Admitting: Family Medicine

## 2021-05-05 DIAGNOSIS — U071 COVID-19: Secondary | ICD-10-CM

## 2021-05-05 HISTORY — DX: COVID-19: U07.1

## 2021-05-05 MED ORDER — NIRMATRELVIR/RITONAVIR (PAXLOVID)TABLET: 3.0000 | ORAL_TABLET | Freq: Two times a day (BID) | ORAL | 0 refills | Status: AC

## 2021-05-05 NOTE — Telephone Encounter (Signed)
Please Advise

## 2021-05-05 NOTE — Telephone Encounter (Signed)
OK, paxlovid eRx'd. I recommend he stop his atorvastatin until he's finished with paxlovid.

## 2021-05-05 NOTE — Telephone Encounter (Signed)
Patient called, states he tested positive for COVID this morning via rapid home test. He complains of cough, fever, and nasal congestion. RN recommended symptom management and asked if he would be interested in medication like Paxlovid. He would like to know if Dr. Linus Salmons thinks it is warranted. Will route to provider.   Beryle Flock, RN

## 2021-05-05 NOTE — Progress Notes (Signed)
Patient was scheduled for excisional biopsy on 6/2 despite attempts at getting scheduled sooner (patient was unable). Patient has tested positive for covid and his biopsy has now been pushed back to 06/16/2021. Patient called to inform us of this change.  Dr Marin Olp notified.   Oncology Nurse Navigator Documentation  Oncology Nurse Navigator Flowsheets 05/05/2021  Abnormal Finding Date -  Diagnosis Status -  Navigator Follow Up Date: 06/16/2021  Navigator Follow Up Reason: Surgery  Navigator Location [facility]  Referral Date to RadOnc/MedOnc -  Navigator Encounter Type Telephone  Telephone Incoming Call;Patient Update  Patient Visit Type MedOnc  Treatment Phase Abnormal Scans  Barriers/Navigation Needs Coordination of Care  Education -  Interventions None Required  Acuity Level 2-Minimal Needs (1-2 Barriers Identified)  Referrals -  Coordination of Care -  Education Method -  Support Groups/Services Friends and Family  Time Spent with Patient 15

## 2021-05-05 NOTE — Telephone Encounter (Signed)
Patient is COVID positive as of this morning.  Cough, fever, congestion. Symptoms started Tues 5/24.  Patient is wanting to know what to do.  He states he is high risk and per CDC guidelines he needs to be treated with medication.    Please advise [phone]

## 2021-05-05 NOTE — Telephone Encounter (Signed)
Called patient, advised him that Dr. Linus Salmons agrees with Paxlovid. Patient wanting to make sure there are no interactions with Phillips Odor, RN confirmed with pharmacy no drug interaction. Patient verbalized understanding and has no further questions.   Beryle Flock, RN

## 2021-05-05 NOTE — Telephone Encounter (Signed)
Spoke with patient regarding recommendations

## 2021-05-10 ENCOUNTER — Other Ambulatory Visit (HOSPITAL_COMMUNITY): Payer: BC Managed Care – PPO

## 2021-05-18 ENCOUNTER — Ambulatory Visit (INDEPENDENT_AMBULATORY_CARE_PROVIDER_SITE_OTHER): Payer: BC Managed Care – PPO | Admitting: *Deleted

## 2021-05-18 DIAGNOSIS — Z0000 Encounter for general adult medical examination without abnormal findings: Secondary | ICD-10-CM | POA: Diagnosis not present

## 2021-05-18 NOTE — Patient Instructions (Signed)
[name] , Thank you for taking time to come for your Medicare Wellness Visit. I appreciate your ongoing commitment to your health goals. Please review the following plan we discussed and let me know if I can assist you in the future.   Screening recommendations/referrals: Colonoscopy: Completed / Will call when ready to have referral placed to schedule Recommended yearly ophthalmology/optometry visit for glaucoma screening and checkup Recommended yearly dental visit for hygiene and checkup  Vaccinations: Influenza vaccine: up to date Pneumococcal vaccine: up to date Tdap vaccine: due Education provided Shingles vaccine: up to date  Advanced directives: Copy requested  Conditions/risks identified: NA  Next appointment: 9-15 @ 8:00 am [place]  Preventive Care 65 Years and Older, Male Preventive care refers to lifestyle choices and visits with your health care provider that can promote health and wellness. What does preventive care include?  A yearly physical exam. This is also called an annual well check.  Dental exams once or twice a year.  Routine eye exams. Ask your health care provider how often you should have your eyes checked.  Personal lifestyle choices, including:  Daily care of your teeth and gums.  Regular physical activity.  Eating a healthy diet.  Avoiding tobacco and drug use.  Limiting alcohol use.  Practicing safe sex.  Taking low doses of aspirin every day.  Taking vitamin and mineral supplements as recommended by your health care provider. What happens during an annual well check? The services and screenings done by your health care provider during your annual well check will depend on your age, overall health, lifestyle risk factors, and family history of disease. Counseling  Your health care provider may ask you questions about your:  Alcohol use.  Tobacco use.  Drug use.  Emotional well-being.  Home and relationship well-being.  Sexual  activity.  Eating habits.  History of falls.  Memory and ability to understand (cognition).  Work and work Statistician. Screening  You may have the following tests or measurements:  Height, weight, and BMI.  Blood pressure.  Lipid and cholesterol levels. These may be checked every 5 years, or more frequently if you are over 68 years old.  Skin check.  Lung cancer screening. You may have this screening every year starting at age 53 if you have a 30-pack-year history of smoking and currently smoke or have quit within the past 15 years.  Fecal occult blood test (FOBT) of the stool. You may have this test every year starting at age 62.  Flexible sigmoidoscopy or colonoscopy. You may have a sigmoidoscopy every 5 years or a colonoscopy every 10 years starting at age 14.  Prostate cancer screening. Recommendations will vary depending on your family history and other risks.  Hepatitis C blood test.  Hepatitis B blood test.  Sexually transmitted disease (STD) testing.  Diabetes screening. This is done by checking your blood sugar (glucose) after you have not eaten for a while (fasting). You may have this done every 1-3 years.  Abdominal aortic aneurysm (AAA) screening. You may need this if you are a current or former smoker.  Osteoporosis. You may be screened starting at age 14 if you are at high risk. Talk with your health care provider about your test results, treatment options, and if necessary, the need for more tests. Vaccines  Your health care provider may recommend certain vaccines, such as:  Influenza vaccine. This is recommended every year.  Tetanus, diphtheria, and acellular pertussis (Tdap, Td) vaccine. You may need a Td  booster every 10 years.  Zoster vaccine. You may need this after age 55.  Pneumococcal 13-valent conjugate (PCV13) vaccine. One dose is recommended after age 47.  Pneumococcal polysaccharide (PPSV23) vaccine. One dose is recommended after age  39. Talk to your health care provider about which screenings and vaccines you need and how often you need them. This information is not intended to replace advice given to you by your health care provider. Make sure you discuss any questions you have with your health care provider. Document Released: 12/24/2015 Document Revised: 08/16/2016 Document Reviewed: 09/28/2015 Elsevier Interactive Patient Education  2017 [employer] Prevention in the Home Falls can cause injuries. They can happen to people of all ages. There are many things you can do to make your home safe and to help prevent falls. What can I do on the outside of my home?  Regularly fix the edges of walkways and driveways and fix any cracks.  Remove anything that might make you trip as you walk through a door, such as a raised step or threshold.  Trim any bushes or trees on the path to your home.  Use bright outdoor lighting.  Clear any walking paths of anything that might make someone trip, such as rocks or tools.  Regularly check to see if handrails are loose or broken. Make sure that both sides of any steps have handrails.  Any raised decks and porches should have guardrails on the edges.  Have any leaves, snow, or ice cleared regularly.  Use sand or salt on walking paths during winter.  Clean up any spills in your garage right away. This includes oil or grease spills. What can I do in the bathroom?  Use night lights.  Install grab bars by the toilet and in the tub and shower. Do not use towel bars as grab bars.  Use non-skid mats or decals in the tub or shower.  If you need to sit down in the shower, use a plastic, non-slip stool.  Keep the floor dry. Clean up any water that spills on the floor as soon as it happens.  Remove soap buildup in the tub or shower regularly.  Attach bath mats securely with double-sided non-slip rug tape.  Do not have throw rugs and other things on the floor that can make  you trip. What can I do in the bedroom?  Use night lights.  Make sure that you have a light by your bed that is easy to reach.  Do not use any sheets or blankets that are too big for your bed. They should not hang down onto the floor.  Have a firm chair that has side arms. You can use this for support while you get dressed.  Do not have throw rugs and other things on the floor that can make you trip. What can I do in the kitchen?  Clean up any spills right away.  Avoid walking on wet floors.  Keep items that you use a lot in easy-to-reach places.  If you need to reach something above you, use a strong step stool that has a grab bar.  Keep electrical cords out of the way.  Do not use floor polish or wax that makes floors slippery. If you must use wax, use non-skid floor wax.  Do not have throw rugs and other things on the floor that can make you trip. What can I do with my stairs?  Do not leave any items on the stairs.  Make sure that there are handrails on both sides of the stairs and use them. Fix handrails that are broken or loose. Make sure that handrails are as long as the stairways.  Check any carpeting to make sure that it is firmly attached to the stairs. Fix any carpet that is loose or worn.  Avoid having throw rugs at the top or bottom of the stairs. If you do have throw rugs, attach them to the floor with carpet tape.  Make sure that you have a light switch at the top of the stairs and the bottom of the stairs. If you do not have them, ask someone to add them for you. What else can I do to help prevent falls?  Wear shoes that:  Do not have high heels.  Have rubber bottoms.  Are comfortable and fit you well.  Are closed at the toe. Do not wear sandals.  If you use a stepladder:  Make sure that it is fully opened. Do not climb a closed stepladder.  Make sure that both sides of the stepladder are locked into place.  Ask someone to hold it for you, if  possible.  Clearly mark and make sure that you can see:  Any grab bars or handrails.  First and last steps.  Where the edge of each step is.  Use tools that help you move around (mobility aids) if they are needed. These include:  Canes.  Walkers.  Scooters.  Crutches.  Turn on the lights when you go into a dark area. Replace any light bulbs as soon as they burn out.  Set up your furniture so you have a clear path. Avoid moving your furniture around.  If any of your floors are uneven, fix them.  If there are any pets around you, be aware of where they are.  Review your medicines with your doctor. Some medicines can make you feel dizzy. This can increase your chance of falling. Ask your doctor what other things that you can do to help prevent falls. This information is not intended to replace advice given to you by your health care provider. Make sure you discuss any questions you have with your health care provider. Document Released: 09/23/2009 Document Revised: 05/04/2016 Document Reviewed: 01/01/2015 Elsevier Interactive Patient Education  2017 [employer].

## 2021-05-18 NOTE — Progress Notes (Signed)
Subjective:   Edwin Padilla is a 70 y.o. male who presents for Medicare Annual/Subsequent preventive examination.  Virtual Visit via Telephone Note  I connected with  Edwin Padilla on 05/18/21 at  9:45 AM EDT by telephone and verified that I am speaking with the correct person using two identifiers.  Location: Patient: Home Provider: Telephone Persons participating in the virtual visit: patient/Nurse Health Advisor   I discussed the limitations, risks, security and privacy concerns of performing an evaluation and management service by telephone and the availability of in person appointments. The patient expressed understanding and agreed to proceed.  Interactive audio and video telecommunications were attempted between this nurse and patient, however failed, due to patient having technical difficulties OR patient did not have access to video capability.  We continued and completed visit with audio only.  Some vital signs may be absent or patient reported.   Leroy Kennedy, LPN   Review of Systems    NA Cardiac Risk Factors include: advanced age (>73men, >36 women);dyslipidemia;male gender;family history of premature cardiovascular disease     Objective:    There were no vitals filed for this visit. There is no height or weight on file to calculate BMI.  Advanced Directives 05/18/2021 05/03/2021 01/08/2018 04/09/2013 04/04/2013  Does Patient Have a Medical Advance Directive? Yes No No Patient has advance directive, copy not in chart Patient has advance directive, copy not in chart  Type of Advance Directive [place];Living will - - - Living will  Does patient want to make changes to medical advance directive? No - Patient declined - - - -  Copy of [place] in Chart? No - copy requested - - - -  Would patient like information on creating a medical advance directive? - - No - Patient declined - -    Current Medications (verified) Outpatient  Encounter Medications as of 05/18/2021  Medication Sig  . aspirin 81 MG chewable tablet Chew 81 mg by mouth daily.  Marland Kitchen atorvastatin (LIPITOR) 40 MG tablet Take 1 tablet (40 mg total) by mouth daily.  Marland Kitchen BIKTARVY 50-200-25 MG TABS tablet TAKE 1 TABLET BY MOUTH DAILY.  Marland Kitchen losartan (COZAAR) 25 MG tablet Take 1 tablet (25 mg total) by mouth daily.  Marland Kitchen albuterol (VENTOLIN HFA) 108 (90 Base) MCG/ACT inhaler Inhale 1-2 puffs into the lungs every 6 (six) hours as needed for wheezing or shortness of breath. (Patient not taking: No sig reported)   No facility-administered encounter medications on file as of 05/18/2021.    Allergies (verified) Lisinopril   History: Past Medical History:  Diagnosis Date  . Allergy    Cat allergy  . Bilateral primary osteoarthritis of knee   . Coronary atherosclerosis of unspecified type of vessel, native or graft    CABG 07/2003; annual cardiology f/u (Dr. Pernell Dupre).  Stable as of 02/2017 f/u--echo 03/2017 showed normal LV fxn---ok to d/c ACE-I per cardiologist.  . COVID-19 virus infection 05/05/2021   paxlovid  . HIV infection ([place]) Dx 02/2002   ID clinic w/ [facility].  . Hyperlipidemia   . Lymphadenopathy 2022   groin and axillary-->hem/onc working up pt as of 03/2021  . 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 Mild intermittent asthma    As of 2020, pt has not used this in "years" but likes to keep it on hand in case he accidentally encounters cats  . Myocardial infarction ([place]) 2004  . Stress fracture of left hip 2020  running injury; sx's resolved with rest.  . Substance abuse ([place])    hx of   Past Surgical History:  Procedure Laterality Date  . CARDIOVASCULAR STRESS TEST  05/2016   No ischemia  . COLONOSCOPY  01/2011   Normal (recall 10 yrs)  . CORONARY ARTERY BYPASS GRAFT  2004  . HERNIA REPAIR     as infant  . INGUINAL HERNIA REPAIR Right 04/09/2013   Procedure: RIGHT HERNIA REPAIR INGUINAL ADULT;  Surgeon: Haywood Lasso, MD;  Location: [facility];  Service: General;  Laterality: Right;  . TONSILLECTOMY    . TRANSESOPHAGEAL ECHOCARDIOGRAM  12/2020   normal (mild av thickening)  . TRANSTHORACIC ECHOCARDIOGRAM  03/19/2017   Normal   Family History  Problem Relation Age of Onset  . Stroke Mother 93  . Heart failure Father   . Heart disease Father   . Colon cancer Father 11  . Cancer Brother 47       lymphoma   Social History   Socioeconomic History  . Marital status: Single    Spouse name: Not on file  . Number of children: Not on file  . Years of education: Not on file  . Highest education level: Not on file  Occupational History  . Not on file  Tobacco Use  . Smoking status: Former Smoker    Years: 35.00    Types: Cigarettes    Quit date: 12/12/2003    Years since quitting: 17.4  . Smokeless tobacco: Never Used  Vaping Use  . Vaping Use: Never used  Substance and Sexual Activity  . Alcohol use: No    Comment: 2005  . Drug use: No    Comment: past history of crystal meth addiction -clean since 2005   . Sexual activity: Not Currently    Comment: 2005  Other Topics Concern  . Not on file  Social History Narrative   Homosexual.   Never married.   Hx of methamph abuse; no IV drug use.  [place] remission drug program, no use since 2005.   Exercises regularly, says he eats a good diet.   Tob: quit 2004, 50 pack-yr hx.   No alcohol.   Social Determinants of Health   Financial Resource Strain: Low Risk   . Difficulty of Paying Living Expenses: Not hard at all  Food Insecurity: No Food Insecurity  . Worried About Charity fundraiser in the Last Year: Never true  . Ran Out of Food in the Last Year: Never true  Transportation Needs: No Transportation Needs  . Lack of Transportation (Medical): No  . Lack of Transportation (Non-Medical): No  Physical Activity: Sufficiently Active  . Days of Exercise per Week: 5 days  . Minutes of Exercise per Session: 40 min  Stress: No Stress Concern  Present  . Feeling of Stress : Not at all  Social Connections: Socially Isolated  . Frequency of Communication with Friends and Family: More than three times a week  . Frequency of Social Gatherings with Friends and Family: Twice a week  . Attends Religious Services: Never  . Active Member of Clubs or Organizations: No  . Attends Archivist Meetings: Never  . Marital Status: Never married    Tobacco Counseling Counseling given: Not Answered   Clinical Intake:     Pain : No/denies pain     Nutritional Risks: None Diabetes: No  How often do you need to have someone help you when you read instructions, pamphlets,  or other written materials from your doctor or pharmacy?: 1 - Never  Diabetic?  NO  Interpreter Needed?: No  Information entered by :: Leroy Kennedy LPN   Activities of Daily Living In your present state of health, do you have any difficulty performing the following activities: 05/18/2021  Hearing? N  Vision? N  Difficulty concentrating or making decisions? N  Walking or climbing stairs? N  Dressing or bathing? N  Doing errands, shopping? N  Preparing Food and eating ? N  Using the Toilet? N  In the past six months, have you accidently leaked urine? N  Do you have problems with loss of bowel control? N  Managing your Medications? N  Managing your Finances? N  Housekeeping or managing your Housekeeping? N  Some recent data might be hidden    Patient Care Team: Tammi Sou, MD as PCP - General (Family Medicine) Belva Crome, MD as PCP - Cardiology (Cardiology) Belva Crome, MD (Cardiology) Neldon Mc, MD (General Surgery) Garlan Fair, MD (Gastroenterology) Comer, Okey Regal, MD as Consulting Physician (Infectious Diseases) Belva Crome, MD as Consulting Physician (Cardiology) Druscilla Brownie, MD as Consulting Physician (Dermatology) Dene Gentry, MD as Consulting Physician (Sports Medicine)  Indicate any recent  Medical Services you may have received from other than [facility] providers in the past year (date may be approximate).     Assessment:   This is a routine wellness examination for [place].  Hearing/Vision screen  Hearing Screening   125Hz  250Hz  500Hz  1000Hz  2000Hz  3000Hz  4000Hz  6000Hz  8000Hz   Right ear:           Left ear:           Comments: No trouble hearing  Vision Screening Comments: [facility]  Next exam 2-22   Dietary issues and exercise activities discussed: Current Exercise Habits: Home exercise routine, Type of exercise: strength training/weights;Other - see comments (paient is a runner), Time (Minutes): 50, Frequency (Times/Week): 4, Weekly Exercise (Minutes/Week): 200, Intensity: Intense  Goals Addressed            This Visit's Progress   . Patient Stated   On track    Maintain current health    . Patient Stated       Keep maintaining current health       Depression Screen PHQ 2/9 Scores 05/18/2021 05/18/2021 08/25/2020 02/27/2020 06/19/2018 01/08/2018 11/13/2017  PHQ - 2 Score 0 0 0 0 0 0 0    Fall Risk Fall Risk  05/18/2021 03/04/2021 02/01/2021 08/26/2020 10/31/2019  Falls in the past year? 0 0 0 0 0  Comment - - - - [place] Telephone Survey: data to providers prior to load  Number falls in past yr: 0 0 0 - -  Injury with Fall? 0 0 0 - -  Risk for fall due to : - - - No Fall Risks -  Follow up Falls evaluation completed;Falls prevention discussed Falls evaluation completed Falls evaluation completed Falls evaluation completed -    FALL RISK PREVENTION PERTAINING TO THE HOME:  Any stairs in or around the home? Yes  If so, are there any without handrails? Yes  Home free of loose throw rugs in walkways, pet beds, electrical cords, etc? Yes  Adequate lighting in your home to reduce risk of falls? Yes   ASSISTIVE DEVICES UTILIZED TO PREVENT FALLS:  Life alert? No  Use of a cane, walker or w/c? No  Grab bars in the bathroom? No  Shower chair or  bench in shower? No   Elevated toilet seat or a handicapped toilet? No   TIMED UP AND GO:  Was the test performed? No .     Cognitive Function:  Normal cognitive status assessed by direct observation by this Nurse Health Advisor. No abnormalities found.         Immunizations Immunization History  Administered Date(s) Administered  . Fluad Quad(high Dose 65+) 08/13/2019, 08/26/2020  . Hepatitis A 01/29/2013  . Hepatitis A, Adult 08/20/2013  . Influenza Split 09/04/2012  . Influenza,inj,Quad PF,6+ Mos 09/27/2017  . Influenza-Unspecified 09/18/2013, 09/22/2015, 10/10/2018  . PFIZER Comirnaty(Gray Top)Covid-19 Tri-Sucrose Vaccine 04/21/2021  . PFIZER(Purple Top)SARS-COV-2 Vaccination 01/17/2020, 02/11/2020, 09/17/2020  . PPD Test 08/11/2009, 08/09/2011  . Pneumococcal Conjugate-13 03/15/2015  . Pneumococcal Polysaccharide-23 08/03/2011, 02/20/2017  . Tdap 09/07/2010  . Zoster Recombinat (Shingrix) 09/12/2017, 09/16/2017  . Zoster, Live 09/14/2014    TDAP status: Due, Education has been provided regarding the importance of this vaccine. Advised may receive this vaccine at local pharmacy or Health Dept. Aware to provide a copy of the vaccination record if obtained from local pharmacy or Health Dept. Verbalized acceptance and understanding.  Flu Vaccine status: Up to date  Pneumococcal vaccine status: Up to date  Covid-19 vaccine status: Completed vaccines  Qualifies for Shingles Vaccine? Yes   Zostavax completed Yes   Shingrix Completed?: Yes  Screening Tests Health Maintenance  Topic Date Due  . Pneumococcal Vaccine 8-88 Years old (1 of 4 - PCV13) Never done  . Zoster Vaccines- Shingrix (2 of 2) 11/11/2017  . TETANUS/TDAP  09/07/2020  . COLONOSCOPY (Pts 45-62yrs Insurance coverage will need to be confirmed)  01/19/2021  . INFLUENZA VACCINE  07/11/2021  . COVID-19 Vaccine  Completed  . Hepatitis C Screening  Completed  . PNA vac Low Risk Adult  Completed  . HPV VACCINES  Aged Out     Health Maintenance  Health Maintenance Due  Topic Date Due  . Pneumococcal Vaccine 43-58 Years old (1 of 4 - PCV13) Never done  . Zoster Vaccines- Shingrix (2 of 2) 11/11/2017  . TETANUS/TDAP  09/07/2020  . COLONOSCOPY (Pts 45-33yrs Insurance coverage will need to be confirmed)  01/19/2021    Colorectal cancer screening: Type of screening: Colonoscopy. Completed 01/2011. Repeat every 10 years  Lung Cancer Screening: (Low Dose CT Chest recommended if Age 68-80 years, 30 pack-year currently smoking OR have quit w/in 15years.) does qualify.   Lung Cancer Screening Referral:   NA  Additional Screening:  Hepatitis C Screening: does qualify; Completed 02/16/2014  Vision Screening: Recommended annual ophthalmology exams for early detection of glaucoma and other disorders of the eye. Is the patient up to date with their annual eye exam?  Yes  Who is the provider or what is the name of the office in which the patient attends annual eye exams? [facility] If pt is not established with a provider, would they like to be referred to a provider to establish care? No .   Dental Screening: Recommended annual dental exams for proper oral hygiene  Community Resource Referral / Chronic Care Management: CRR required this visit?  No   CCM required this visit?  No      Plan:     I have personally reviewed and noted the following in the patient's chart:   . Medical and social history . Use of alcohol, tobacco or illicit drugs  . Current medications and supplements including opioid prescriptions. Patient is not currently taking opioid prescriptions. . Functional  ability and status . Nutritional status . Physical activity . Advanced directives . List of other physicians . Hospitalizations, surgeries, and ER visits in previous 12 months . Vitals . Screenings to include cognitive, depression, and falls . Referrals and appointments  In addition, I have reviewed and discussed with patient  certain preventive protocols, quality metrics, and best practice recommendations. A written personalized care plan for preventive services as well as general preventive health recommendations were provided to patient.     Leroy Kennedy, LPN   [id number]   Nurse Notes:

## 2021-06-03 ENCOUNTER — Other Ambulatory Visit: Payer: Self-pay

## 2021-06-03 ENCOUNTER — Encounter (HOSPITAL_BASED_OUTPATIENT_CLINIC_OR_DEPARTMENT_OTHER): Payer: Self-pay | Admitting: Surgery

## 2021-06-14 ENCOUNTER — Encounter (HOSPITAL_BASED_OUTPATIENT_CLINIC_OR_DEPARTMENT_OTHER)
Admission: RE | Admit: 2021-06-14 | Discharge: 2021-06-14 | Disposition: A | Discharge status: Home / Self Care | Payer: BC Managed Care – PPO | Source: Ambulatory Visit | Attending: Surgery | Admitting: Surgery

## 2021-06-14 DIAGNOSIS — Z79899 Other long term (current) drug therapy: Secondary | ICD-10-CM | POA: Diagnosis not present

## 2021-06-14 DIAGNOSIS — Z7982 Long term (current) use of aspirin: Secondary | ICD-10-CM | POA: Diagnosis not present

## 2021-06-14 DIAGNOSIS — Z21 Asymptomatic human immunodeficiency virus [HIV] infection status: Secondary | ICD-10-CM | POA: Diagnosis not present

## 2021-06-14 DIAGNOSIS — R590 Localized enlarged lymph nodes: Secondary | ICD-10-CM | POA: Diagnosis not present

## 2021-06-14 DIAGNOSIS — Z8249 Family history of ischemic heart disease and other diseases of the circulatory system: Secondary | ICD-10-CM | POA: Diagnosis not present

## 2021-06-14 DIAGNOSIS — Z7951 Long term (current) use of inhaled steroids: Secondary | ICD-10-CM | POA: Diagnosis not present

## 2021-06-14 DIAGNOSIS — Z87891 Personal history of nicotine dependence: Secondary | ICD-10-CM | POA: Diagnosis not present

## 2021-06-14 NOTE — Progress Notes (Signed)

## 2021-06-15 ENCOUNTER — Other Ambulatory Visit: Payer: Self-pay | Admitting: Surgery

## 2021-06-15 NOTE — Anesthesia Preprocedure Evaluation (Addendum)
Anesthesia Evaluation  Patient identified by MRN, date of birth, ID band Patient awake    Reviewed: Allergy & Precautions, NPO status , Patient's Chart, lab work & pertinent test results  Airway Mallampati: II  TM Distance: >3 FB Neck ROM: Full    Dental no notable dental hx. (+) Teeth Intact, Dental Advisory Given   Pulmonary asthma , former smoker,    Pulmonary exam normal breath sounds clear to auscultation       Cardiovascular + CAD and + Past MI  Normal cardiovascular exam Rhythm:Regular Rate:Normal  12/16/20 Echo 1. Left ventricular ejection fraction, by estimation, is 55 to 60%. The  left ventricle has normal function. The left ventricle has no regional  wall motion abnormalities. Left ventricular diastolic parameters were  normal.  2. Right ventricular systolic function is normal. The right ventricular  size is normal. There is normal pulmonary artery systolic pressure. The  estimated right ventricular systolic pressure is 04.5 mmHg.  3. Right atrial size was mildly dilated.  4. The mitral valve is normal in structure. No evidence of mitral valve  regurgitation. No evidence of mitral stenosis.  5. The aortic valve is tricuspid. There is mild thickening of the aortic  valve. Aortic valve regurgitation is not visualized. No aortic stenosis is  present.  6. The inferior vena cava is normal in size with greater than 50%  respiratory variability, suggesting right atrial pressure of 3 mmHg.    Neuro/Psych negative neurological ROS  negative psych ROS   GI/Hepatic negative GI ROS, Neg liver ROS,   Endo/Other  negative endocrine ROS  Renal/GU negative Renal ROS     Musculoskeletal  (+) Arthritis ,   Abdominal   Peds  Hematology  (+) HIV,   Anesthesia Other Findings   Reproductive/Obstetrics                            Anesthesia Physical Anesthesia Plan  ASA: 3  Anesthesia Plan:  MAC   Post-op Pain Management:    Induction:   PONV Risk Score and Plan: 2 and Treatment may vary due to age or medical condition, Midazolam and Ondansetron  Airway Management Planned: Natural Airway  Additional Equipment: None  Intra-op Plan:   Post-operative Plan:   Informed Consent: I have reviewed the patients History and Physical, chart, labs and discussed the procedure including the risks, benefits and alternatives for the proposed anesthesia with the patient or authorized representative who has indicated his/her understanding and acceptance.     Dental advisory given  Plan Discussed with: CRNA and Anesthesiologist  Anesthesia Plan Comments:        Anesthesia Quick Evaluation

## 2021-06-15 NOTE — H&P (Signed)
Edwin Padilla  Location: [facility] Patient #: [id number] DOB: 1951-10-27 Single / Language: Cleophus Molt / Race: White Male   History of Present Illness The patient is a 70 year old male who presents with lymphadenopathy.  Chief complaint: Inguinal lymphadenopathy  This is a pleasant 70 year old gentleman referred by Dr. Marin Olp for evaluation of inguinal lymphadenopathy.  He has a history of HIV and has a family should lymphoma.  He noticed lymph node enlargement several months ago.  He is asymptomatic with no discomfort and no systemic symptoms like fevers, chills, hot flashes, weight loss.  He has had a fairly unremarkable CAT scan of his neck, chest, abdomen and pelvis. He is very active and has had no current cardiopulmonary issues.  He is an avid runner.   Past Surgical History Emeline Gins, CMA; 04/05/2021 9:12 AM) Coronary Artery Bypass Graft   Open Inguinal Hernia Surgery   Right.  Diagnostic Studies History Emeline Gins, [place]; Colonoscopy   5-10 years ago  Allergies Emeline Gins, [place];  No Known Drug Allergies   Allergies Reconciled    Medication History Emeline Gins, [place];  Atorvastatin Calcium  (40MG Tablet, Oral) Active. Losartan Potassium  (25MG Tablet, Oral) Active. Biktarvy  (50-200-25MG Tablet, Oral) Active. Albuterol  (90MCG/ACT Aerosol Soln, Inhalation) Active. Aspirin  (81MG Tablet, Oral) Active. Medications Reconciled   Social History Emeline Gins, [place]; Alcohol use   Remotely quit alcohol use. Caffeine use   Coffee. Illicit drug use   Uses daily. Tobacco use   Former smoker.  Family History Emeline Gins, [place]; Arthritis   Mother. Cancer   Father. Depression   Sister. Heart Disease   Father. Heart disease in male family member before age 14   Hypertension   Father.  Other Problems Emeline Gins, CMA;  Asthma   HIV-positive   Inguinal Hernia   Myocardial infarction      Review of Systems  General Not Present-  Appetite Loss, Chills, Fatigue, Fever, Night Sweats, Weight Gain and Weight Loss. Skin Not Present- Change in Wart/Mole, Dryness, Hives, Jaundice, New Lesions, Non-Healing Wounds, Rash and Ulcer. HEENT Not Present- Earache, Hearing Loss, Hoarseness, Nose Bleed, Oral Ulcers, Ringing in the Ears, Seasonal Allergies, Sinus Pain, Sore Throat, Visual Disturbances, Wears glasses/contact lenses and Yellow Eyes. Respiratory Not Present- Bloody sputum, Chronic Cough, Difficulty Breathing, Snoring and Wheezing. Breast Not Present- Breast Mass, Breast Pain, Nipple Discharge and Skin Changes. Cardiovascular Not Present- Chest Pain, Difficulty Breathing Lying Down, Leg Cramps, Palpitations, Rapid Heart Rate, Shortness of Breath and Swelling of Extremities. Gastrointestinal Not Present- Abdominal Pain, Bloating, Bloody Stool, Change in Bowel Habits, Chronic diarrhea, Constipation, Difficulty Swallowing, Excessive gas, Gets full quickly at meals, Hemorrhoids, Indigestion, Nausea, Rectal Pain and Vomiting. Male Genitourinary Not Present- Blood in Urine, Change in Urinary Stream, Frequency, Impotence, Nocturia, Painful Urination, Urgency and Urine Leakage. Musculoskeletal Not Present- Back Pain, Joint Pain, Joint Stiffness, Muscle Pain, Muscle Weakness and Swelling of Extremities. Neurological Not Present- Decreased Memory, Fainting, Headaches, Numbness, Seizures, Tingling, Tremor, Trouble walking and Weakness. Psychiatric Not Present- Anxiety, Bipolar, Change in Sleep Pattern, Depression, Fearful and Frequent crying. Endocrine Not Present- Cold Intolerance, Excessive Hunger, Hair Changes, Heat Intolerance, Hot flashes and New Diabetes. Hematology Present- HIV. Not Present- Blood Thinners, Easy Bruising, Excessive bleeding, Gland problems and Persistent Infections.  Vitals   Weight: 159.8 lb   Height: 70 in  Body Surface Area: 1.9 m   Body Mass Index: 22.93 kg/m   Temp.: 98.2 F    Pulse: 63 (Regular)  BP:  122/82(Sitting, Left Arm, Standard     Physical Exam The physical exam findings are as follows: Note:  Generally he appears well.  His abdomen is soft and nontender. There are multiple shotty lymph nodes in his inguinal areas bilaterally which are nontender. There are no skin changes. The largest is in the left inguinal area. There are no abdominal masses. There are no hernias.  There is no hepatomegaly  Breathing is nonlabored  There is no rashes there is no jaundice    Assessment & Plan   INGUINAL LYMPHADENOPATHY (R59.0)  Impression: I have reviewed his notes in the electronic medical records. I have reviewed his notes from oncology and have reviewed the CAT scan of his neck, chest, abdomen, and pelvis.  He does have lymphadenopathy of uncertain etiology. Again he has no systemic symptoms from the lymph nodes. Given his medical history and family history however, excisional biopsy of one of the lymph nodes is recommended for complete histologic evaluation to rule out a malignancy. I discussed the reasons for this with him in detail. We discussed the surgical procedure in detail. We discussed the risk which includes but is not limited to bleeding, infection, injury to surrounding structures, seroma formation, the chance this may be nondiagnostic, etc. I believe this can be done with a Mac anesthesia as an outpatient. He understands agreed to proceed with surgery which will be scheduled

## 2021-06-16 ENCOUNTER — Ambulatory Visit (HOSPITAL_BASED_OUTPATIENT_CLINIC_OR_DEPARTMENT_OTHER)
Admission: RE | Admit: 2021-06-16 | Discharge: 2021-06-16 | Disposition: A | Discharge status: Home / Self Care | Payer: BC Managed Care – PPO | Attending: Surgery | Admitting: Surgery

## 2021-06-16 ENCOUNTER — Encounter (HOSPITAL_BASED_OUTPATIENT_CLINIC_OR_DEPARTMENT_OTHER)
Admission: RE | Disposition: A | Discharge status: Home / Self Care | Payer: Self-pay | Source: Home / Self Care | Attending: Surgery

## 2021-06-16 ENCOUNTER — Ambulatory Visit (HOSPITAL_BASED_OUTPATIENT_CLINIC_OR_DEPARTMENT_OTHER): Payer: BC Managed Care – PPO | Admitting: Anesthesiology

## 2021-06-16 ENCOUNTER — Encounter (HOSPITAL_BASED_OUTPATIENT_CLINIC_OR_DEPARTMENT_OTHER): Payer: Self-pay | Admitting: Surgery

## 2021-06-16 ENCOUNTER — Other Ambulatory Visit: Payer: Self-pay

## 2021-06-16 ENCOUNTER — Encounter: Payer: Self-pay | Admitting: *Deleted

## 2021-06-16 DIAGNOSIS — B20 Human immunodeficiency virus [HIV] disease: Secondary | ICD-10-CM | POA: Diagnosis not present

## 2021-06-16 DIAGNOSIS — Z8249 Family history of ischemic heart disease and other diseases of the circulatory system: Secondary | ICD-10-CM | POA: Insufficient documentation

## 2021-06-16 DIAGNOSIS — Z79899 Other long term (current) drug therapy: Secondary | ICD-10-CM | POA: Insufficient documentation

## 2021-06-16 DIAGNOSIS — I2510 Atherosclerotic heart disease of native coronary artery without angina pectoris: Secondary | ICD-10-CM | POA: Diagnosis not present

## 2021-06-16 DIAGNOSIS — I252 Old myocardial infarction: Secondary | ICD-10-CM | POA: Diagnosis not present

## 2021-06-16 DIAGNOSIS — Z7982 Long term (current) use of aspirin: Secondary | ICD-10-CM | POA: Insufficient documentation

## 2021-06-16 DIAGNOSIS — R590 Localized enlarged lymph nodes: Secondary | ICD-10-CM | POA: Diagnosis not present

## 2021-06-16 DIAGNOSIS — R591 Generalized enlarged lymph nodes: Secondary | ICD-10-CM | POA: Diagnosis not present

## 2021-06-16 DIAGNOSIS — R897 Abnormal histological findings in specimens from other organs, systems and tissues: Secondary | ICD-10-CM | POA: Diagnosis not present

## 2021-06-16 DIAGNOSIS — Z21 Asymptomatic human immunodeficiency virus [HIV] infection status: Secondary | ICD-10-CM | POA: Diagnosis not present

## 2021-06-16 DIAGNOSIS — Z87891 Personal history of nicotine dependence: Secondary | ICD-10-CM | POA: Diagnosis not present

## 2021-06-16 DIAGNOSIS — Z7951 Long term (current) use of inhaled steroids: Secondary | ICD-10-CM | POA: Insufficient documentation

## 2021-06-16 HISTORY — PX: LYMPH NODE BIOPSY: SHX201

## 2021-06-16 SURGERY — LYMPH NODE BIOPSY
Anesthesia: Monitor Anesthesia Care | Site: Groin | Laterality: Left

## 2021-06-16 MED ORDER — PROPOFOL 500 MG/50ML IV EMUL
INTRAVENOUS | Status: DC | PRN
  Administered 2021-06-16: 25 ug/kg/min via INTRAVENOUS

## 2021-06-16 MED ORDER — CHLORHEXIDINE GLUCONATE CLOTH 2 % EX PADS: 6.0000 | MEDICATED_PAD | Freq: Once | CUTANEOUS | Status: DC

## 2021-06-16 MED ORDER — FENTANYL CITRATE (PF) 100 MCG/2ML IJ SOLN
INTRAMUSCULAR | Status: DC | PRN
  Administered 2021-06-16 (×3): 25 ug via INTRAVENOUS

## 2021-06-16 MED ORDER — ONDANSETRON HCL 4 MG/2ML IJ SOLN: 4.0000 mg | Freq: Once | INTRAMUSCULAR | Status: DC | PRN

## 2021-06-16 MED ORDER — PROPOFOL 10 MG/ML IV BOLUS
INTRAVENOUS | Status: AC
  Filled 2021-06-16: qty 20

## 2021-06-16 MED ORDER — LIDOCAINE HCL (PF) 1 % IJ SOLN
INTRAMUSCULAR | Status: DC | PRN
  Administered 2021-06-16: 5 mL

## 2021-06-16 MED ORDER — PROPOFOL 10 MG/ML IV BOLUS
INTRAVENOUS | Status: DC | PRN
  Administered 2021-06-16 (×2): 20 mg via INTRAVENOUS

## 2021-06-16 MED ORDER — FENTANYL CITRATE (PF) 100 MCG/2ML IJ SOLN: 25.0000 ug | INTRAMUSCULAR | Status: DC | PRN

## 2021-06-16 MED ORDER — BUPIVACAINE-EPINEPHRINE 0.5% -1:200000 IJ SOLN
INTRAMUSCULAR | Status: DC | PRN
  Administered 2021-06-16: 10 mL

## 2021-06-16 MED ORDER — ACETAMINOPHEN 10 MG/ML IV SOLN: 1000.0000 mg | Freq: Once | INTRAVENOUS | Status: DC | PRN

## 2021-06-16 MED ORDER — ONDANSETRON HCL 4 MG/2ML IJ SOLN
INTRAMUSCULAR | Status: AC
  Filled 2021-06-16: qty 2

## 2021-06-16 MED ORDER — ACETAMINOPHEN 500 MG PO TABS
1000.0000 mg | ORAL_TABLET | ORAL | Status: AC
  Administered 2021-06-16: 1000 mg via ORAL

## 2021-06-16 MED ORDER — GLYCOPYRROLATE 0.2 MG/ML IJ SOLN
INTRAMUSCULAR | Status: DC | PRN
  Administered 2021-06-16: .1 mg via INTRAVENOUS
  Administered 2021-06-16: .2 mg via INTRAVENOUS
  Administered 2021-06-16: .1 mg via INTRAVENOUS

## 2021-06-16 MED ORDER — GLYCOPYRROLATE PF 0.2 MG/ML IJ SOSY
PREFILLED_SYRINGE | INTRAMUSCULAR | Status: AC
  Filled 2021-06-16: qty 1

## 2021-06-16 MED ORDER — DEXAMETHASONE SODIUM PHOSPHATE 10 MG/ML IJ SOLN
INTRAMUSCULAR | Status: AC
  Filled 2021-06-16: qty 1

## 2021-06-16 MED ORDER — EPHEDRINE 5 MG/ML INJ
INTRAVENOUS | Status: AC
  Filled 2021-06-16: qty 10

## 2021-06-16 MED ORDER — MIDAZOLAM HCL 2 MG/2ML IJ SOLN
INTRAMUSCULAR | Status: AC
  Filled 2021-06-16: qty 2

## 2021-06-16 MED ORDER — ONDANSETRON HCL 4 MG/2ML IJ SOLN
INTRAMUSCULAR | Status: DC | PRN
  Administered 2021-06-16: 4 mg via INTRAVENOUS

## 2021-06-16 MED ORDER — FENTANYL CITRATE (PF) 100 MCG/2ML IJ SOLN
INTRAMUSCULAR | Status: AC
  Filled 2021-06-16: qty 2

## 2021-06-16 MED ORDER — CEFAZOLIN SODIUM-DEXTROSE 2-4 GM/100ML-% IV SOLN
2.0000 g | INTRAVENOUS | Status: AC
  Administered 2021-06-16: 2 g via INTRAVENOUS

## 2021-06-16 MED ORDER — MIDAZOLAM HCL 5 MG/5ML IJ SOLN
INTRAMUSCULAR | Status: DC | PRN
  Administered 2021-06-16: 2 mg via INTRAVENOUS

## 2021-06-16 MED ORDER — ACETAMINOPHEN 500 MG PO TABS
ORAL_TABLET | ORAL | Status: AC
  Filled 2021-06-16: qty 2

## 2021-06-16 MED ORDER — EPHEDRINE SULFATE 50 MG/ML IJ SOLN
INTRAMUSCULAR | Status: DC | PRN
  Administered 2021-06-16 (×2): 10 mg via INTRAVENOUS

## 2021-06-16 MED ORDER — LIDOCAINE HCL (PF) 1 % IJ SOLN
INTRAMUSCULAR | Status: AC
  Filled 2021-06-16: qty 30

## 2021-06-16 MED ORDER — CEFAZOLIN SODIUM-DEXTROSE 2-4 GM/100ML-% IV SOLN
INTRAVENOUS | Status: AC
  Filled 2021-06-16: qty 100

## 2021-06-16 MED ORDER — LIDOCAINE HCL (PF) 2 % IJ SOLN
INTRAMUSCULAR | Status: AC
  Filled 2021-06-16: qty 5

## 2021-06-16 MED ORDER — TRAMADOL HCL 50 MG PO TABS: 50.0000 mg | ORAL_TABLET | Freq: Four times a day (QID) | ORAL | 1 refills | Status: DC | PRN

## 2021-06-16 MED ORDER — PROPOFOL 500 MG/50ML IV EMUL
INTRAVENOUS | Status: AC
  Filled 2021-06-16: qty 50

## 2021-06-16 MED ORDER — LACTATED RINGERS IV SOLN: INTRAVENOUS | Status: DC

## 2021-06-16 MED ORDER — LIDOCAINE 2% (20 MG/ML) 5 ML SYRINGE
INTRAMUSCULAR | Status: DC | PRN
  Administered 2021-06-16: 40 mg via INTRAVENOUS

## 2021-06-16 SURGICAL SUPPLY — 33 items
ADH SKN CLS APL DERMABOND .7 (GAUZE/BANDAGES/DRESSINGS) ×2
APL PRP STRL LF DISP 70% ISPRP (MISCELLANEOUS) ×1
BLADE SURG 15 STRL LF DISP TIS (BLADE) ×1 IMPLANT
BLADE SURG 15 STRL SS (BLADE) ×2
CANISTER SUCT 1200ML W/VALVE (MISCELLANEOUS) ×2 IMPLANT
CHLORAPREP W/TINT 26 (MISCELLANEOUS) ×2 IMPLANT
COVER BACK TABLE 60X90IN (DRAPES) ×2 IMPLANT
COVER MAYO STAND STRL (DRAPES) ×2 IMPLANT
DERMABOND ADVANCED (GAUZE/BANDAGES/DRESSINGS) ×2
DERMABOND ADVANCED .7 DNX12 (GAUZE/BANDAGES/DRESSINGS) ×2 IMPLANT
DRAPE LAPAROTOMY 100X72 PEDS (DRAPES) ×2 IMPLANT
DRAPE UTILITY XL STRL (DRAPES) ×2 IMPLANT
ELECT REM PT RETURN 9FT ADLT (ELECTROSURGICAL) ×2
ELECTRODE REM PT RTRN 9FT ADLT (ELECTROSURGICAL) ×1 IMPLANT
GLOVE SURG SIGNA 7.5 PF LTX (GLOVE) ×2 IMPLANT
GOWN STRL REUS W/ TWL LRG LVL3 (GOWN DISPOSABLE) ×1 IMPLANT
GOWN STRL REUS W/ TWL XL LVL3 (GOWN DISPOSABLE) ×1 IMPLANT
GOWN STRL REUS W/TWL LRG LVL3 (GOWN DISPOSABLE) ×2
GOWN STRL REUS W/TWL XL LVL3 (GOWN DISPOSABLE) ×2
NEEDLE HYPO 25X1 1.5 SAFETY (NEEDLE) ×2 IMPLANT
NS IRRIG 1000ML POUR BTL (IV SOLUTION) ×2 IMPLANT
PACK BASIN DAY SURGERY FS (CUSTOM PROCEDURE TRAY) ×2 IMPLANT
PENCIL SMOKE EVACUATOR (MISCELLANEOUS) ×2 IMPLANT
SLEEVE SCD COMPRESS KNEE MED (STOCKING) ×2 IMPLANT
SPONGE T-LAP 4X18 ~~LOC~~+RFID (SPONGE) ×2 IMPLANT
SUT ETHILON 2 0 FS 18 (SUTURE) IMPLANT
SUT MNCRL AB 4-0 PS2 18 (SUTURE) ×2 IMPLANT
SUT VIC AB 3-0 SH 27 (SUTURE) ×2
SUT VIC AB 3-0 SH 27X BRD (SUTURE) ×1 IMPLANT
SYR CONTROL 10ML LL (SYRINGE) ×2 IMPLANT
TOWEL GREEN STERILE FF (TOWEL DISPOSABLE) ×2 IMPLANT
TUBE CONNECTING 20X1/4 (TUBING) ×2 IMPLANT
YANKAUER SUCT BULB TIP NO VENT (SUCTIONS) ×2 IMPLANT

## 2021-06-16 NOTE — Progress Notes (Signed)
Patient had excisional biopsy completed this morning. Will follow for path result.   Oncology Nurse Navigator Documentation  Oncology Nurse Navigator Flowsheets 06/16/2021  Abnormal Finding Date -  Diagnosis Status -  Navigator Follow Up Date: 06/21/2021  Navigator Follow Up Reason: Pathology  Navigator Location [facility]  Referral Date to RadOnc/MedOnc -  Navigator Encounter Type Appt/Treatment Plan Review  Telephone -  Patient Visit Type MedOnc  Treatment Phase Abnormal Scans  Barriers/Navigation Needs No Barriers At This Time  Education -  Interventions None Required  Acuity Level 1-No Barriers  Referrals -  Coordination of Care -  Education Method -  Support Groups/Services Friends and Family  Time Spent with Patient 15

## 2021-06-16 NOTE — Anesthesia Postprocedure Evaluation (Signed)
Anesthesia Post Note  Patient: Edwin Padilla  Procedure(s) Performed: EXCISIONAL BIOPSY LEFT INGUINAL LYMPH NODE (Left: Groin)     Patient location during evaluation: PACU Anesthesia Type: MAC Level of consciousness: awake and alert Pain management: pain level controlled Vital Signs Assessment: post-procedure vital signs reviewed and stable Respiratory status: spontaneous breathing, nonlabored ventilation, respiratory function stable and patient connected to nasal cannula oxygen Cardiovascular status: stable and blood pressure returned to baseline Postop Assessment: no apparent nausea or vomiting Anesthetic complications: no   No notable events documented.  Last Vitals:  Vitals:   06/16/21 0815 06/16/21 0830  BP: 122/81 103/65  Pulse: (!) 59 63  Resp: 10 12  Temp:  36.5 C  SpO2: 95% 95%    Last Pain:  Vitals:   06/16/21 0830  TempSrc:   PainSc: 0-No pain                 Barnet Glasgow

## 2021-06-16 NOTE — Discharge Instructions (Addendum)
You may shower starting tomorrow  Ice pack, Tylenol, ibuprofen also for pain  No vigorous activity for 1 week    Post Anesthesia Home Care Instructions  Activity: Get plenty of rest for the remainder of the day. A responsible individual must stay with you for 24 hours following the procedure.  For the next 24 hours, DO NOT: -Drive a car -Paediatric nurse -Drink alcoholic beverages -Take any medication unless instructed by your physician -Make any legal decisions or sign important papers.  Meals: Start with liquid foods such as gelatin or soup. Progress to regular foods as tolerated. Avoid greasy, spicy, heavy foods. If nausea and/or vomiting occur, drink only clear liquids until the nausea and/or vomiting subsides. Call your physician if vomiting continues.  Special Instructions/Symptoms: Your throat may feel dry or sore from the anesthesia or the breathing tube placed in your throat during surgery. If this causes discomfort, gargle with warm salt water. The discomfort should disappear within 24 hours.  If you had a scopolamine patch placed behind your ear for the management of post- operative nausea and/or vomiting:  1. The medication in the patch is effective for 72 hours, after which it should be removed.  Wrap patch in a tissue and discard in the trash. Wash hands thoroughly with soap and water. 2. You may remove the patch earlier than 72 hours if you experience unpleasant side effects which may include dry mouth, dizziness or visual disturbances. 3. Avoid touching the patch. Wash your hands with soap and water after contact with the patch.

## 2021-06-16 NOTE — Op Note (Signed)
EXCISIONAL BIOPSY LEFT INGUINAL LYMPH NODE  Procedure Note  K[place] A Lamoreaux 06/16/2021   Pre-op Diagnosis: lymphadenopathy     Post-op Diagnosis: same  Procedure(s): EXCISIONAL BIOPSY LEFT INGUINAL LYMPH NODE  Surgeon(s): Coralie Keens, MD  Anesthesia: Monitor Anesthesia Care  Staff:  Circulator: Izora Ribas, RN Scrub Person: Lorenza Burton, CST  Estimated Blood Loss: Minimal               Specimens: sent to path  Indications: This is a 70 year old gentleman who presents with bilateral inguinal lymphadenopathy.  He is HIV positive but well controlled.  He has no symptoms from the adenopathy.  Oncology has requested a lymph node excisional biopsy  Procedure: The patient is brought to operating identifies correct patient.  He was placed upon the operating table and general anesthesia was induced.  His left inguinal area was prepped and draped in usual sterile fashion.  I anesthetized the skin over a palpable lymph node with lidocaine.  I then made incision with a scalpel.  I dissected down through the subcutaneous tissue to the lymph node which was easily identified.  It was almost 2 cm in size.  I excised with electrocautery and sent to pathology for evaluation.  I achieved hemostasis with the cautery.  I then injected Marcaine into the incision.  I then closed the subcutaneous tissue with interrupted 3-0 Vicryl sutures and closed the skin with a running 4-0 Monocryl.  Dermabond was then applied.  The patient tolerated the procedure well.  All the counts were correct at the end of the procedure.  The patient was then taken in a stable condition from the operating room to the recovery room.          Coralie Keens   Date: 06/16/2021  Time: 7:49 AM

## 2021-06-16 NOTE — Transfer of Care (Signed)
Immediate Anesthesia Transfer of Care Note  Patient: Edwin Padilla  Procedure(s) Performed: EXCISIONAL BIOPSY LEFT INGUINAL LYMPH NODE (Left: Groin)  Patient Location: PACU  Anesthesia Type:MAC  Level of Consciousness: awake  Airway & Oxygen Therapy: Patient Spontanous Breathing and Patient connected to face mask oxygen  Post-op Assessment: Report given to RN and Post -op Vital signs reviewed and stable  Post vital signs: Reviewed and stable  Last Vitals:  Vitals Value Taken Time  BP 123/88 06/16/21 0753  Temp    Pulse 72 06/16/21 0754  Resp 5 06/16/21 0754  SpO2 99 % 06/16/21 0754  Vitals shown include unvalidated device data.  Last Pain:  Vitals:   06/16/21 0636  TempSrc: Oral  PainSc: 0-No pain      Patients Stated Pain Goal: 4 (18/29/93 7169)  Complications: No notable events documented.

## 2021-06-16 NOTE — Interval H&P Note (Signed)
History and Physical Interval Note:no change in H and P  06/16/2021 7:07 AM  Edwin Padilla  has presented today for surgery, with the diagnosis of lymphadenopathy.  The various methods of treatment have been discussed with the patient and family. After consideration of risks, benefits and other options for treatment, the patient has consented to  Procedure(s): EXCISIONAL BIOPSY LEFT INGUINAL LYMPH NODE (Left) as a surgical intervention.  The patient's history has been reviewed, patient examined, no change in status, stable for surgery.  I have reviewed the patient's chart and labs.  Questions were answered to the patient's satisfaction.     Coralie Keens

## 2021-06-17 ENCOUNTER — Encounter (HOSPITAL_BASED_OUTPATIENT_CLINIC_OR_DEPARTMENT_OTHER): Payer: Self-pay | Admitting: Surgery

## 2021-06-21 ENCOUNTER — Encounter: Payer: Self-pay | Admitting: *Deleted

## 2021-06-21 ENCOUNTER — Encounter: Payer: Self-pay | Admitting: Family Medicine

## 2021-06-21 LAB — SURGICAL PATHOLOGY

## 2021-06-21 NOTE — Progress Notes (Signed)
Pathology returned without conclusive diagnosis. Reviewed with Dr Marin Olp. He requests to speak with the pathologist, Dr Gari Crown. Message given to his desk nurse to page said MD.  Once Dr Marin Olp discusses results with pathologist, follow up plan will be determined.   Oncology Nurse Navigator Documentation  Oncology Nurse Navigator Flowsheets 06/21/2021  Abnormal Finding Date -  Diagnosis Status -  Navigator Follow Up Date: 06/22/2021  Navigator Follow Up Reason: Appointment Review  Navigator Location [facility]  Referral Date to RadOnc/MedOnc -  Navigator Encounter Type Pathology Review  Telephone -  Patient Visit Type MedOnc  Treatment Phase Abnormal Scans  Barriers/Navigation Needs No Barriers At This Time  Education -  Interventions Coordination of Care  Acuity Level 1-No Barriers  Referrals -  Coordination of Care Pathology  Education Method -  Support Groups/Services Friends and Family  Time Spent with Patient 15

## 2021-06-22 ENCOUNTER — Encounter: Payer: Self-pay | Admitting: *Deleted

## 2021-06-22 NOTE — Progress Notes (Signed)
Dr Marin Olp has spoken to pathologist. Pathology isn't diagnostic for lymphoma, however there are small sections of lymph node which show what might be very early lymphoma. Dr Marin Olp would like to see patient for follow up at the end of August.  Called and spoke to patient. Reviewed path findings with patient. Answered his questions to his satisfaction. Patient is aware of his follow up appointment.   Oncology Nurse Navigator Documentation  Oncology Nurse Navigator Flowsheets 06/22/2021  Abnormal Finding Date -  Diagnosis Status -  Navigator Follow Up Date: 08/08/2021  Navigator Follow Up Reason: Follow-up Appointment  Navigator Location [facility]  Referral Date to RadOnc/MedOnc -  Navigator Encounter Type Appt/Treatment Plan Review;Telephone  Telephone Diagnostic Results;Education;Outgoing Call  Patient Visit Type MedOnc  Treatment Phase Abnormal Scans  Barriers/Navigation Needs Coordination of Care;Education  Education Other  Interventions Coordination of Care;Education;Psycho-Social Support  Acuity Level 2-Minimal Needs (1-2 Barriers Identified)  Referrals -  Coordination of Care Appts  Education Method Verbal  Support Groups/Services Friends and Family  Time Spent with Patient 30

## 2021-08-05 ENCOUNTER — Other Ambulatory Visit: Payer: Self-pay | Admitting: *Deleted

## 2021-08-05 DIAGNOSIS — R599 Enlarged lymph nodes, unspecified: Secondary | ICD-10-CM

## 2021-08-08 ENCOUNTER — Inpatient Hospital Stay (HOSPITAL_BASED_OUTPATIENT_CLINIC_OR_DEPARTMENT_OTHER): Payer: BC Managed Care – PPO | Admitting: Hematology & Oncology

## 2021-08-08 ENCOUNTER — Telehealth: Payer: Self-pay

## 2021-08-08 ENCOUNTER — Inpatient Hospital Stay: Payer: BC Managed Care – PPO | Attending: Hematology & Oncology

## 2021-08-08 ENCOUNTER — Other Ambulatory Visit: Payer: Self-pay

## 2021-08-08 ENCOUNTER — Encounter: Payer: Self-pay | Admitting: *Deleted

## 2021-08-08 ENCOUNTER — Inpatient Hospital Stay: Payer: BC Managed Care – PPO

## 2021-08-08 ENCOUNTER — Encounter: Payer: Self-pay | Admitting: Hematology & Oncology

## 2021-08-08 VITALS — BP 115/69 | HR 51 | Temp 98.2°F | Resp 17 | Wt 162.0 lb

## 2021-08-08 DIAGNOSIS — Z21 Asymptomatic human immunodeficiency virus [HIV] infection status: Secondary | ICD-10-CM

## 2021-08-08 DIAGNOSIS — C880 Waldenstrom macroglobulinemia: Secondary | ICD-10-CM | POA: Insufficient documentation

## 2021-08-08 DIAGNOSIS — D472 Monoclonal gammopathy: Secondary | ICD-10-CM

## 2021-08-08 DIAGNOSIS — R590 Localized enlarged lymph nodes: Secondary | ICD-10-CM

## 2021-08-08 DIAGNOSIS — B20 Human immunodeficiency virus [HIV] disease: Secondary | ICD-10-CM | POA: Diagnosis not present

## 2021-08-08 DIAGNOSIS — R599 Enlarged lymph nodes, unspecified: Secondary | ICD-10-CM

## 2021-08-08 HISTORY — DX: Localized enlarged lymph nodes: R59.0

## 2021-08-08 HISTORY — DX: Monoclonal gammopathy: D47.2

## 2021-08-08 LAB — CBC WITH DIFFERENTIAL (CANCER CENTER ONLY)
Abs Immature Granulocytes: 0.03 10*3/uL (ref 0.00–0.07)
Basophils Absolute: 0 10*3/uL (ref 0.0–0.1)
Basophils Relative: 0 %
Eosinophils Absolute: 0.2 10*3/uL (ref 0.0–0.5)
Eosinophils Relative: 3 %
HCT: 40.1 % (ref 39.0–52.0)
Hemoglobin: 13.8 g/dL (ref 13.0–17.0)
Immature Granulocytes: 0 %
Lymphocytes Relative: 30 %
Lymphs Abs: 2.1 10*3/uL (ref 0.7–4.0)
MCH: 31.2 pg (ref 26.0–34.0)
MCHC: 34.4 g/dL (ref 30.0–36.0)
MCV: 90.7 fL (ref 80.0–100.0)
Monocytes Absolute: 0.8 10*3/uL (ref 0.1–1.0)
Monocytes Relative: 11 %
Neutro Abs: 3.9 10*3/uL (ref 1.7–7.7)
Neutrophils Relative %: 56 %
Platelet Count: 291 10*3/uL (ref 150–400)
RBC: 4.42 MIL/uL (ref 4.22–5.81)
RDW: 13.4 % (ref 11.5–15.5)
WBC Count: 7 10*3/uL (ref 4.0–10.5)
nRBC: 0 % (ref 0.0–0.2)

## 2021-08-08 LAB — CMP (CANCER CENTER ONLY)
ALT: 25 U/L (ref 0–44)
AST: 21 U/L (ref 15–41)
Albumin: 4 g/dL (ref 3.5–5.0)
Alkaline Phosphatase: 50 U/L (ref 38–126)
Anion gap: 9 (ref 5–15)
BUN: 22 mg/dL (ref 8–23)
CO2: 26 mmol/L (ref 22–32)
Calcium: 9.6 mg/dL (ref 8.9–10.3)
Chloride: 104 mmol/L (ref 98–111)
Creatinine: 1.32 mg/dL — ABNORMAL HIGH (ref 0.61–1.24)
GFR, Estimated: 58 mL/min — ABNORMAL LOW (ref >60)
Glucose, Bld: 92 mg/dL (ref 70–99)
Potassium: 4.2 mmol/L (ref 3.5–5.1)
Sodium: 139 mmol/L (ref 135–145)
Total Bilirubin: 0.6 mg/dL (ref 0.3–1.2)
Total Protein: 7.5 g/dL (ref 6.5–8.1)

## 2021-08-08 LAB — LACTATE DEHYDROGENASE: LDH: 127 U/L (ref 98–192)

## 2021-08-08 NOTE — Progress Notes (Signed)
Patient will continue on observation only with no definitive cancer diagnosis. Will end navigation.   Oncology Nurse Navigator Documentation  Oncology Nurse Navigator Flowsheets 08/08/2021  Abnormal Finding Date -  Diagnosis Status -  Navigator Follow Up Date: -  Navigator Follow Up Reason: -  Navigation Complete Date: 08/08/2021  Post Navigation: Continue to Follow Patient? No  Reason Not Navigating Patient: No Cancer Diagnosis  Navigator Location [facility]  Referral Date to RadOnc/MedOnc -  Navigator Encounter Type Appt/Treatment Plan Review;Telephone  Telephone -  Patient Visit Type MedOnc  Treatment Phase Abnormal Scans  Barriers/Navigation Needs Coordination of Care;Education  Education -  Interventions None Required  Acuity Level 1-No Barriers  Referrals -  Coordination of Care -  Education Method -  Support Groups/Services Friends and Family  Time Spent with Patient 15

## 2021-08-08 NOTE — Progress Notes (Signed)
Hematology and Oncology Follow Up Visit  KESLER DOMRES [id number] 04-28-1951 70 y.o. 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  We saw him initially back in April.  At that time, there was a concern over the possibility of some type of lymphoproliferative process.  He does have the history of HIV but this is very well controlled.  He has been incredibly busy.  He is working.  He exercises.  He has had no trouble with this.    We initially saw him back in April.  We actually did a CT scan on him.  All this shows some nonspecific adenopathy in the inguinal area.  I then did a biopsy of a left inguinal lymph node.  This is done on 06/16/2021.  The pathology report [id number]) showed a atypical lymphoid proliferation.  There was a minor monoclonal B-cell population.  There were follicles.  There was some kappa light chain restriction.  The follicles are positive for CD10 and BCL6.  The pathologist felt that this pattern was somewhat concerning for the possibility of a B-cell lymphoproliferative process.  We did do immunoglobulins on him.  He had elevated IgM level of 1800 mg/dL.  I just do not see that we have to embark upon any intervention right now.  He is totally asymptomatic.  I really would not know what we would be treating.  I think if we follow his IgM level, this will give [place] an idea as to whether or not his disease is active.  Of note, he is planning on going to [place], [place] in November for vacation.  I am sure he will have a wonderful time.  He has had no issues with lymph node swelling.  He has had no rashes.  There is been no change in bowel or bladder habits.  He has had no cough.  He has had no nausea or vomiting.  Overall, I would say his performance status is ECOG 1.     Medications:  Current Outpatient Medications:    aspirin 81 MG chewable  tablet, Chew 81 mg by mouth daily., Disp: , Rfl:    atorvastatin (LIPITOR) 40 MG tablet, Take 1 tablet (40 mg total) by mouth daily., Disp: 90 tablet, Rfl: 3   BIKTARVY 50-200-25 MG TABS tablet, TAKE 1 TABLET BY MOUTH DAILY., Disp: 90 tablet, Rfl: 2   losartan (COZAAR) 25 MG tablet, Take 1 tablet (25 mg total) by mouth daily., Disp: 90 tablet, Rfl: 2   traMADol (ULTRAM) 50 MG tablet, Take 1-2 tablets (50-100 mg total) by mouth every 6 (six) hours as needed. (Patient not taking: Reported on 08/08/2021), Disp: 30 tablet, Rfl: 1  Allergies:  Allergies  Allergen Reactions   Lisinopril Cough    cough    Past Medical History, Surgical history, Social history, and Family History were reviewed and updated.  Review of Systems: Review of Systems  Constitutional: Negative.   HENT:  Negative.    Eyes: Negative.   Respiratory: Negative.    Cardiovascular: Negative.   Gastrointestinal: Negative.   Endocrine: Negative.   Genitourinary: Negative.    Musculoskeletal: Negative.   Skin: Negative.   Neurological: Negative.   Hematological: Negative.   Psychiatric/Behavioral: Negative.     Physical Exam:  weight is 162 lb (73.5 kg). His oral temperature is 98.2 F (36.8 C). His blood pressure is 115/69 and his pulse  is 51 (abnormal). His respiration is 17 and oxygen saturation is 98%.   Wt Readings from Last 3 Encounters:  08/08/21 162 lb (73.5 kg)  06/16/21 160 lb 15 oz (73 kg)  03/11/21 163 lb (73.9 kg)    Physical Exam Vitals reviewed.  HENT:     Head: Normocephalic and atraumatic.  Eyes:     Pupils: Pupils are equal, round, and reactive to light.  Cardiovascular:     Rate and Rhythm: Normal rate and regular rhythm.     Heart sounds: Normal heart sounds.  Pulmonary:     Effort: Pulmonary effort is normal.     Breath sounds: Normal breath sounds.  Abdominal:     General: Bowel sounds are normal.     Palpations: Abdomen is soft.  Musculoskeletal:        General: No tenderness or  deformity. Normal range of motion.     Cervical back: Normal range of motion.  Lymphadenopathy:     Cervical: No cervical adenopathy.  Skin:    General: Skin is warm and dry.     Findings: No erythema or rash.  Neurological:     Mental Status: He is alert and oriented to person, place, and time.  Psychiatric:        Behavior: Behavior normal.        Thought Content: Thought content normal.        Judgment: Judgment normal.     Lab Results  Component Value Date   WBC 7.0 08/08/2021   HGB 13.8 08/08/2021   HCT 40.1 08/08/2021   MCV 90.7 08/08/2021   PLT 291 08/08/2021     Chemistry      Component Value Date/Time   NA 139 03/11/2021 1100   K 4.5 03/11/2021 1100   CL 104 03/11/2021 1100   CO2 30 03/11/2021 1100   BUN 22 03/11/2021 1100   CREATININE 1.09 03/11/2021 1100   CREATININE 1.15 08/10/2020 0854      Component Value Date/Time   CALCIUM 9.4 03/11/2021 1100   ALKPHOS 43 03/11/2021 1100   AST 17 03/11/2021 1100   ALT 22 03/11/2021 1100   BILITOT 0.7 03/11/2021 1100     Impression and Plan: Mr. Polacek is a very nice 70 year old white male.  He just turned 70 years old.  He has what some appears to be is some type of "smoldering" lymphoproliferative process.  I am sure this probably will be some type of lymphoplasmacytic lymphoma.  However, I just do not see that we need to embark upon any intervention right now.  I do still see that he is symptomatic.  His blood counts look okay.  We will see what his IgM level is.  I think this is how we can gauge it for activity.  I do not see any issues with respect to him having the past history of HIV.  He is on Biktarvy for this.  I would like to see him back in January.  I think this would be a very reasonable time a follow-up for him.  I know that he is incredibly busy at work.  Hopefully, work will slow down a little bit so we can enjoy more leisurely activities.   Volanda Napoleon, MD 8/29/20229:27 AM

## 2021-08-08 NOTE — Telephone Encounter (Signed)
Pt appts made per 08/08/21 los, pt to gain sch at [facility] and/or through [employer] [facility]

## 2021-08-09 LAB — KAPPA/LAMBDA LIGHT CHAINS
Kappa free light chain: 125.6 mg/L — ABNORMAL HIGH (ref 3.3–19.4)
Kappa, lambda light chain ratio: 22.04 — ABNORMAL HIGH (ref 0.26–1.65)
Lambda free light chains: 5.7 mg/L (ref 5.7–26.3)

## 2021-08-09 LAB — IGG, IGA, IGM
IgA: 153 mg/dL (ref 61–437)
IgG (Immunoglobin G), Serum: 562 mg/dL — ABNORMAL LOW (ref 603–1613)
IgM (Immunoglobulin M), Srm: 1990 mg/dL — ABNORMAL HIGH (ref 20–172)

## 2021-08-10 ENCOUNTER — Telehealth: Payer: Self-pay

## 2021-08-10 NOTE — Telephone Encounter (Signed)
-----   Message from Volanda Napoleon, MD sent at 08/09/2021  5:32 PM EDT ----- Call - the IgM level is only slightly higher than it was 5 months ago.  I still do NOT think we need to embark on any therapy!!!  Laurey Arrow

## 2021-08-10 NOTE — Telephone Encounter (Signed)
Called and informed patient of lab results, patient verbalized understanding and denies any questions or concerns at this time.   

## 2021-08-11 LAB — PROTEIN ELECTROPHORESIS, SERUM, WITH REFLEX
A/G Ratio: 1 (ref 0.7–1.7)
Albumin ELP: 3.6 g/dL (ref 2.9–4.4)
Alpha-1-Globulin: 0.2 g/dL (ref 0.0–0.4)
Alpha-2-Globulin: 0.6 g/dL (ref 0.4–1.0)
Beta Globulin: 0.7 g/dL (ref 0.7–1.3)
Gamma Globulin: 2.1 g/dL — ABNORMAL HIGH (ref 0.4–1.8)
Globulin, Total: 3.6 g/dL (ref 2.2–3.9)
M-Spike, %: 1.1 g/dL — ABNORMAL HIGH
SPEP Interpretation: 0
Total Protein ELP: 7.2 g/dL (ref 6.0–8.5)

## 2021-08-11 LAB — IMMUNOFIXATION REFLEX, SERUM
IgA: 145 mg/dL (ref 61–437)
IgG (Immunoglobin G), Serum: 569 mg/dL — ABNORMAL LOW (ref 603–1613)
IgM (Immunoglobulin M), Srm: 2076 mg/dL — ABNORMAL HIGH (ref 20–172)

## 2021-08-16 ENCOUNTER — Other Ambulatory Visit (HOSPITAL_COMMUNITY)
Admission: RE | Admit: 2021-08-16 | Discharge: 2021-08-16 | Disposition: A | Discharge status: Home / Self Care | Payer: BC Managed Care – PPO | Source: Ambulatory Visit | Attending: Internal Medicine | Admitting: Internal Medicine

## 2021-08-16 ENCOUNTER — Other Ambulatory Visit: Payer: BC Managed Care – PPO

## 2021-08-16 ENCOUNTER — Other Ambulatory Visit: Payer: Self-pay

## 2021-08-16 DIAGNOSIS — Z21 Asymptomatic human immunodeficiency virus [HIV] infection status: Secondary | ICD-10-CM | POA: Insufficient documentation

## 2021-08-16 DIAGNOSIS — Z113 Encounter for screening for infections with a predominantly sexual mode of transmission: Secondary | ICD-10-CM

## 2021-08-16 DIAGNOSIS — E7849 Other hyperlipidemia: Secondary | ICD-10-CM

## 2021-08-17 LAB — T-HELPER CELL (CD4) - (RCID CLINIC ONLY)
CD4 % Helper T Cell: 37 % (ref 33–65)
CD4 T Cell Abs: 521 /uL (ref 400–1790)

## 2021-08-17 LAB — URINE CYTOLOGY ANCILLARY ONLY
Chlamydia: NEGATIVE
Comment: NEGATIVE
Comment: NORMAL
Neisseria Gonorrhea: NEGATIVE

## 2021-08-18 LAB — COMPLETE METABOLIC PANEL WITH GFR
AG Ratio: 1.4 (calc) (ref 1.0–2.5)
ALT: 27 U/L (ref 9–46)
AST: 20 U/L (ref 10–35)
Albumin: 4.2 g/dL (ref 3.6–5.1)
Alkaline phosphatase (APISO): 49 U/L (ref 35–144)
BUN: 19 mg/dL (ref 7–25)
CO2: 26 mmol/L (ref 20–32)
Calcium: 9.5 mg/dL (ref 8.6–10.3)
Chloride: 104 mmol/L (ref 98–110)
Creat: 1.2 mg/dL (ref 0.70–1.28)
Globulin: 3 g/dL (calc) (ref 1.9–3.7)
Glucose, Bld: 100 mg/dL — ABNORMAL HIGH (ref 65–99)
Potassium: 4.8 mmol/L (ref 3.5–5.3)
Sodium: 137 mmol/L (ref 135–146)
Total Bilirubin: 0.8 mg/dL (ref 0.2–1.2)
Total Protein: 7.2 g/dL (ref 6.1–8.1)
eGFR: 65 mL/min/{1.73_m2} (ref >=60)

## 2021-08-18 LAB — LIPID PANEL
Cholesterol: 124 mg/dL (ref <200)
HDL: 42 mg/dL (ref >=40)
LDL Cholesterol (Calc): 68 mg/dL (calc)
Non-HDL Cholesterol (Calc): 82 mg/dL (calc) (ref <130)
Total CHOL/HDL Ratio: 3 (calc) (ref <5.0)
Triglycerides: 55 mg/dL (ref <150)

## 2021-08-18 LAB — CBC WITH DIFFERENTIAL/PLATELET
Absolute Monocytes: 621 cells/uL (ref 200–950)
Basophils Absolute: 21 cells/uL (ref 0–200)
Basophils Relative: 0.3 %
Eosinophils Absolute: 110 cells/uL (ref 15–500)
Eosinophils Relative: 1.6 %
HCT: 40.6 % (ref 38.5–50.0)
Hemoglobin: 13.9 g/dL (ref 13.2–17.1)
Lymphs Abs: 1511 cells/uL (ref 850–3900)
MCH: 31.2 pg (ref 27.0–33.0)
MCHC: 34.2 g/dL (ref 32.0–36.0)
MCV: 91 fL (ref 80.0–100.0)
MPV: 10.6 fL (ref 7.5–12.5)
Monocytes Relative: 9 %
Neutro Abs: 4637 cells/uL (ref 1500–7800)
Neutrophils Relative %: 67.2 %
Platelets: 283 10*3/uL (ref 140–400)
RBC: 4.46 10*6/uL (ref 4.20–5.80)
RDW: 13.9 % (ref 11.0–15.0)
Total Lymphocyte: 21.9 %
WBC: 6.9 10*3/uL (ref 3.8–10.8)

## 2021-08-18 LAB — HIV-1 RNA QUANT-NO REFLEX-BLD
HIV 1 RNA Quant: NOT DETECTED Copies/mL
HIV-1 RNA Quant, Log: NOT DETECTED Log cps/mL

## 2021-08-18 LAB — RPR: RPR Ser Ql: NONREACTIVE

## 2021-08-20 ENCOUNTER — Other Ambulatory Visit: Payer: Self-pay | Admitting: Family Medicine

## 2021-08-22 NOTE — Telephone Encounter (Signed)
Pt has upcoming appt on 9/15

## 2021-08-24 ENCOUNTER — Other Ambulatory Visit: Payer: Self-pay

## 2021-08-25 ENCOUNTER — Encounter: Payer: Self-pay | Admitting: Family Medicine

## 2021-08-25 ENCOUNTER — Ambulatory Visit (INDEPENDENT_AMBULATORY_CARE_PROVIDER_SITE_OTHER): Payer: BC Managed Care – PPO | Admitting: Family Medicine

## 2021-08-25 VITALS — BP 125/79 | HR 54 | Temp 97.6°F | Resp 16 | Ht 70.0 in | Wt 162.8 lb

## 2021-08-25 DIAGNOSIS — E7800 Pure hypercholesterolemia, unspecified: Secondary | ICD-10-CM | POA: Diagnosis not present

## 2021-08-25 DIAGNOSIS — Z0000 Encounter for general adult medical examination without abnormal findings: Secondary | ICD-10-CM

## 2021-08-25 DIAGNOSIS — N401 Enlarged prostate with lower urinary tract symptoms: Secondary | ICD-10-CM | POA: Diagnosis not present

## 2021-08-25 DIAGNOSIS — Z125 Encounter for screening for malignant neoplasm of prostate: Secondary | ICD-10-CM | POA: Diagnosis not present

## 2021-08-25 DIAGNOSIS — N138 Other obstructive and reflux uropathy: Secondary | ICD-10-CM

## 2021-08-25 DIAGNOSIS — Z23 Encounter for immunization: Secondary | ICD-10-CM | POA: Diagnosis not present

## 2021-08-25 DIAGNOSIS — I10 Essential (primary) hypertension: Secondary | ICD-10-CM | POA: Diagnosis not present

## 2021-08-25 LAB — PSA, MEDICARE: PSA: 0.83 ng/ml (ref 0.10–4.00)

## 2021-08-25 MED ORDER — TAMSULOSIN HCL 0.4 MG PO CAPS: ORAL_CAPSULE | ORAL | 1 refills | Status: DC

## 2021-08-25 MED ORDER — ATORVASTATIN CALCIUM 40 MG PO TABS: 40.0000 mg | ORAL_TABLET | Freq: Every day | ORAL | 3 refills | Status: DC

## 2021-08-25 NOTE — Progress Notes (Signed)
Office Note 08/25/2021  CC:  Chief Complaint  Patient presents with   Annual Exam    Pt is not fasting    HPI:  Edwin Padilla is a 70 y.o. White male who is here for annual health maintenance exam and f/u HTN and HLD. Feeling well. Still an avid runner.  Inguinal LN eval 06/2021-->bx-->path showed atypical lymphocyte proliferation worrisome for B cell lymphoproliferative process, IgM increased as well. Dr. Marin Olp feels he has a smoldering process at this time and no treatment recommended, particularly b/c pt is asymptomatic.    HTN: always normal at home.  Having more and more nocturia over time--up every 1-2 hrs.  Stream is sometimes poor.  Some incomplete emptying in night but sx's in daytime minimal. No dysuria.   Past Medical History:  Diagnosis Date   Allergy    Cat allergy   Bilateral primary osteoarthritis of knee    Coronary atherosclerosis of unspecified type of vessel, native or graft    CABG 07/2003; annual cardiology f/u (Dr. Pernell Dupre).  Stable as of 02/2017 f/u--echo 03/2017 showed normal LV fxn---ok to d/c ACE-I per cardiologist.   COVID-19 virus infection 05/05/2021   paxlovid   HIV infection ([place]) Dx 02/2002   ID clinic w/ [facility].   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 Marin Olp following.   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   Substance abuse ([place])    hx of    Past Surgical History:  Procedure Laterality Date   CARDIOVASCULAR STRESS TEST  05/2016   No ischemia   COLONOSCOPY  01/2011   Normal (recall 10 yrs)    CORONARY ARTERY BYPASS GRAFT  2004   HERNIA REPAIR     as infant   INGUINAL HERNIA REPAIR Right 04/09/2013   Procedure: RIGHT HERNIA REPAIR INGUINAL ADULT;  Surgeon: Haywood Lasso, MD;  Location: [place];  Service: General;  Laterality: Right;   LYMPH NODE BIOPSY Left 06/16/2021   Procedure: EXCISIONAL BIOPSY LEFT INGUINAL LYMPH NODE;  Surgeon: Coralie Keens, MD;  Location: [facility];  Service: General;  Laterality: Left;   TONSILLECTOMY     TRANSESOPHAGEAL ECHOCARDIOGRAM  12/2020   normal (mild av thickening)   TRANSTHORACIC ECHOCARDIOGRAM  03/19/2017   Normal    Family History  Problem Relation Age of Onset   Stroke Mother 21   Heart failure Father    Heart disease Father    Colon cancer Father 43   Cancer Brother 69       lymphoma    Social History   Socioeconomic History   Marital status: Single    Spouse name: Not on file   Number of children: Not on file   Years of education: Not on file   Highest education level: Not on file  Occupational History   Not on file  Tobacco Use   Smoking status: Former    Years: 35.00    Types: Cigarettes    Quit date: 12/12/2003    Years since quitting: 61.7  Smokeless tobacco: Never  Vaping Use   Vaping Use: Never used  Substance and Sexual Activity   Alcohol use: No    Comment: 2005   Drug use: No    Comment: past history of crystal meth addiction -clean since 2005    Sexual activity: Not Currently    Comment: 2005  Other Topics Concern   Not on file  Social History Narrative   Homosexual.   Never married.   Hx of methamph abuse; no IV drug use.  [place] remission drug program, no use since 2005.   Exercises regularly, says he eats a good diet.   Tob: quit 2004, 50 pack-yr hx.   No alcohol.   Social Determinants of Health   Financial Resource Strain: Low Risk    Difficulty of Paying Living Expenses: Not hard at all  Food Insecurity: No Food Insecurity   Worried About Paediatric nurse in the Last Year: Never true   [place] in the Last Year: Never true  Transportation Needs: No Transportation Needs   Lack of Transportation (Medical): No   Lack of Transportation (Non-Medical): No  Physical Activity: Sufficiently Active   Days of Exercise per Week: 5 days   Minutes of Exercise per Session: 40 min  Stress: No Stress Concern Present   Feeling of Stress : Not at all  Social Connections: Socially Isolated   Frequency of Communication with Friends and Family: More than three times a week   Frequency of Social Gatherings with Friends and Family: Twice a week   Attends Religious Services: Never   Marine scientist or Organizations: No   Attends Music therapist: Never   Marital Status: Never married  Human resources officer Violence: Not At Risk   Fear of Current or Ex-Partner: No   Emotionally Abused: No   Physically Abused: No   Sexually Abused: No    Outpatient Medications Prior to Visit  Medication Sig Dispense Refill   aspirin 81 MG chewable tablet Chew 81 mg by mouth daily.     BIKTARVY 50-200-25 MG TABS tablet TAKE 1 TABLET BY MOUTH DAILY. 90 tablet 2   losartan (COZAAR) 25 MG tablet Take 1 tablet (25 mg total) by mouth daily. 90 tablet 2   atorvastatin (LIPITOR) 40 MG tablet Take 1 tablet (40 mg total) by mouth daily. 90 tablet 3   traMADol (ULTRAM) 50 MG tablet Take 1-2 tablets (50-100 mg total) by mouth every 6 (six) hours as needed. (Patient not taking: No sig reported) 30 tablet 1   No facility-administered medications prior to visit.    Allergies  Allergen Reactions   Lisinopril Cough    cough    ROS Review of Systems  Constitutional:  Negative for appetite change, chills, fatigue and fever.  HENT:  Negative for congestion, dental problem, ear pain and sore throat.   Eyes:  Negative for discharge, redness and visual disturbance.  Respiratory:  Negative for cough, chest tightness, shortness of breath and wheezing.    Cardiovascular:  Negative for chest pain, palpitations and leg swelling.  Gastrointestinal:  Negative for abdominal pain, blood in stool, diarrhea, nausea and vomiting.  Genitourinary:  Positive for difficulty urinating. Negative for dysuria, flank pain, frequency, hematuria and urgency.       Nocturia   Musculoskeletal:  Negative for arthralgias, back pain, joint swelling, myalgias and neck stiffness.  Skin:  Negative for pallor and rash.  Neurological:  Negative for dizziness, speech difficulty, weakness and headaches.  Hematological:  Negative for adenopathy. Does not bruise/bleed easily.  Psychiatric/Behavioral:  Negative for confusion and sleep disturbance. The patient is not nervous/anxious.    PE; Vitals with BMI 08/25/2021 08/08/2021 06/16/2021  Height '5\' 10"'$  - -  Weight 162 lbs 13 oz 162 lbs -  BMI [id number] - -  Systolic 0000000 [id number]  Diastolic 79 69 65  Pulse 54 51 63     Gen: Alert, well appearing.  Patient is oriented to person, place, time, and situation. AFFECT: pleasant, lucid thought and speech. ENT: Ears: EACs clear, normal epithelium.  TMs with good light reflex and landmarks bilaterally.  Eyes: no injection, icteris, swelling, or exudate.  EOMI, PERRLA. Nose: no drainage or turbinate edema/swelling.  No injection or focal lesion.  Mouth: lips without lesion/swelling.  Oral mucosa pink and moist.  Dentition intact and without obvious caries or gingival swelling.  Oropharynx without erythema, exudate, or swelling.  Neck: supple/nontender.  No LAD, mass, or TM.  Carotid pulses 2+ bilaterally, without bruits. CV: RRR, no m/r/g.   LUNGS: CTA bilat, nonlabored resps, good aeration in all lung fields. ABD: soft, NT, ND, BS normal.  No hepatospenomegaly or mass.  No bruits. EXT: no clubbing, cyanosis, or edema.  Musculoskeletal: no joint swelling, erythema, warmth, or tenderness.  ROM of all joints intact. Skin - no sores or suspicious lesions or rashes or color  changes  Pertinent labs:  Lab Results  Component Value Date   TSH 1.48 04/27/2017   Lab Results  Component Value Date   WBC 6.9 08/16/2021   HGB 13.9 08/16/2021   HCT 40.6 08/16/2021   MCV 91.0 08/16/2021   PLT 283 08/16/2021   Lab Results  Component Value Date   CREATININE 1.20 08/16/2021   BUN 19 08/16/2021   NA 137 08/16/2021   K 4.8 08/16/2021   CL 104 08/16/2021   CO2 26 08/16/2021   Lab Results  Component Value Date   ALT 27 08/16/2021   AST 20 08/16/2021   ALKPHOS 50 08/08/2021   BILITOT 0.8 08/16/2021   Lab Results  Component Value Date   CHOL 124 08/16/2021   Lab Results  Component Value Date   HDL 42 08/16/2021   Lab Results  Component Value Date   LDLCALC 68 08/16/2021   Lab Results  Component Value Date   TRIG 55 08/16/2021   Lab Results  Component Value Date   CHOLHDL 3.0 08/16/2021   Lab Results  Component Value Date   PSA 0.38 08/25/2020   PSA 0.43 04/27/2017   PSA 0.46 03/15/2016   ASSESSMENT AND PLAN:   1) HTN, well controlled on losartan 25 qd. Lytes/cr normal 1 wk ago.  2) HLD: tolerating atorva 40 qd. FLP and hepatic panel 1 wk ago normal/at goal.  3) BPH--main symptom is nocturia. Start flomax 0.'4mg'$  qhs. Therapeutic expectations and side effect profile of medication discussed today.  Patient's questions answered.  4) Health maintenance exam: Reviewed age and gender appropriate health maintenance issues (prudent diet, regular exercise, health risks of tobacco and excessive alcohol, use of seatbelts, fire alarms in home, use of sunscreen).  Also reviewed age and gender appropriate health screening as well as vaccine recommendations. Vaccines: Flu->given today.  Tdap due->he pushed this off for now.  Otherwise all utd. Labs: CBC, CMET, FLP all normal 1 wk ago.  HIV monitoring labs all good at that time as well. Prostate ca screening: needs PSA today. Colon ca screening: normal colonoscopy 2012-->due for recall.  An  After  Visit Summary was printed and given to the patient.  FOLLOW UP:  Return in about 4 weeks (around 09/22/2021) for f/u bph.  Signed:  Crissie Sickles, MD           08/25/2021

## 2021-08-29 ENCOUNTER — Ambulatory Visit (INDEPENDENT_AMBULATORY_CARE_PROVIDER_SITE_OTHER): Payer: BC Managed Care – PPO | Admitting: Internal Medicine

## 2021-08-29 ENCOUNTER — Encounter: Payer: Self-pay | Admitting: Internal Medicine

## 2021-08-29 ENCOUNTER — Other Ambulatory Visit: Payer: Self-pay

## 2021-08-29 VITALS — BP 116/75 | HR 52 | Temp 97.6°F | Wt 162.8 lb

## 2021-08-29 DIAGNOSIS — Z5181 Encounter for therapeutic drug level monitoring: Secondary | ICD-10-CM | POA: Diagnosis not present

## 2021-08-29 DIAGNOSIS — Z21 Asymptomatic human immunodeficiency virus [HIV] infection status: Secondary | ICD-10-CM

## 2021-08-29 DIAGNOSIS — Z113 Encounter for screening for infections with a predominantly sexual mode of transmission: Secondary | ICD-10-CM

## 2021-08-29 DIAGNOSIS — E7849 Other hyperlipidemia: Secondary | ICD-10-CM | POA: Diagnosis not present

## 2021-08-29 NOTE — Assessment & Plan Note (Signed)
He continues to do well on [employer] with no concerns.  No changes indicated and he can rtc in 1 year.

## 2021-08-29 NOTE — Assessment & Plan Note (Signed)
LFTs, creat wnl.

## 2021-08-29 NOTE — Assessment & Plan Note (Signed)
Screened negative.   Discussed monkeypox and no risk factors.

## 2021-08-29 NOTE — Progress Notes (Signed)
   Subjective:    Patient ID: Edwin Padilla, male    DOB: 1951/07/28, 71 y.o.   MRN: [id number]  HPI Here for follow up of HIV He continues on Biktarvy with no missed doses.  No new issues.  CD4 of 521 and viral load not detected.  He was recently diagnosed with a smoldering IgM lymphoproliferative disorder and no treatment indicated at this time.  No new concerns otherwise.  Asking about the COVID-19 booster.    Review of Systems  Constitutional:  Negative for fatigue and unexpected weight change.  Gastrointestinal:  Negative for diarrhea and nausea.  Skin:  Negative for rash.      Objective:   Physical Exam Eyes:     General: No scleral icterus. Pulmonary:     Effort: Pulmonary effort is normal.  Neurological:     General: No focal deficit present.     Mental Status: He is alert.  Psychiatric:        Mood and Affect: Mood normal.   SH: no tobacco       Assessment & Plan:

## 2021-09-05 ENCOUNTER — Ambulatory Visit: Payer: BC Managed Care – PPO

## 2021-09-05 ENCOUNTER — Other Ambulatory Visit: Payer: Self-pay

## 2021-09-05 DIAGNOSIS — Z23 Encounter for immunization: Secondary | ICD-10-CM

## 2021-09-05 NOTE — Progress Notes (Signed)
   Covid-19 Vaccination Clinic  Name:  Edwin Padilla    MRN: [id number] DOB: March 25, 1951  09/05/2021  [name] was observed post Covid-19 immunization for 15 minutes without incident. He was provided with Vaccine Information Sheet and instruction to access the V-Safe system.   [name] was instructed to call 911 with any severe reactions post vaccine: Difficulty breathing  Swelling of face and throat  A fast heartbeat  A bad rash all over body  Dizziness and weakness     Zsazsa Bahena T Brooks Sailors

## 2021-09-22 ENCOUNTER — Ambulatory Visit: Payer: BC Managed Care – PPO | Admitting: Family Medicine

## 2021-09-23 ENCOUNTER — Encounter: Payer: Self-pay | Admitting: Family Medicine

## 2021-09-23 ENCOUNTER — Other Ambulatory Visit: Payer: Self-pay

## 2021-09-23 MED ORDER — ALBUTEROL SULFATE HFA 108 (90 BASE) MCG/ACT IN AERS: 1.0000 | INHALATION_SPRAY | Freq: Four times a day (QID) | RESPIRATORY_TRACT | 0 refills | Status: DC | PRN

## 2021-09-30 ENCOUNTER — Other Ambulatory Visit: Payer: Self-pay | Admitting: Internal Medicine

## 2021-09-30 DIAGNOSIS — Z21 Asymptomatic human immunodeficiency virus [HIV] infection status: Secondary | ICD-10-CM

## 2021-11-13 ENCOUNTER — Other Ambulatory Visit: Payer: Self-pay | Admitting: Interventional Cardiology

## 2021-11-21 ENCOUNTER — Other Ambulatory Visit: Payer: Self-pay | Admitting: Interventional Cardiology

## 2021-12-14 ENCOUNTER — Telehealth: Payer: Self-pay | Admitting: Interventional Cardiology

## 2021-12-14 MED ORDER — LOSARTAN POTASSIUM 25 MG PO TABS: 25.0000 mg | ORAL_TABLET | Freq: Every day | ORAL | 1 refills | Status: DC

## 2021-12-14 NOTE — Telephone Encounter (Signed)
°*  STAT* If patient is at the pharmacy, call can be transferred to refill team.   1. Which medications need to be refilled? (please list name of each medication and dose if known) losartan (COZAAR) 25 MG tablet  2. Which pharmacy/location (including street and city if local pharmacy) is medication to be sent to? [facility] [id number] - [place], [place]  3. Do they need a 30 day or 90 day supply? [place]

## 2021-12-14 NOTE — Telephone Encounter (Signed)
Pt's medication was sent to pt's pharmacy as requested. Confirmation received.  °

## 2021-12-19 ENCOUNTER — Encounter: Payer: Self-pay | Admitting: Hematology & Oncology

## 2021-12-19 ENCOUNTER — Other Ambulatory Visit: Payer: Self-pay

## 2021-12-19 ENCOUNTER — Inpatient Hospital Stay (HOSPITAL_BASED_OUTPATIENT_CLINIC_OR_DEPARTMENT_OTHER): Payer: BC Managed Care – PPO | Admitting: Hematology & Oncology

## 2021-12-19 ENCOUNTER — Inpatient Hospital Stay: Payer: BC Managed Care – PPO | Attending: Hematology & Oncology

## 2021-12-19 VITALS — BP 113/80 | HR 58 | Temp 97.6°F | Resp 18 | Ht 70.0 in | Wt 161.0 lb

## 2021-12-19 DIAGNOSIS — C880 Waldenstrom macroglobulinemia: Secondary | ICD-10-CM | POA: Diagnosis present

## 2021-12-19 DIAGNOSIS — D472 Monoclonal gammopathy: Secondary | ICD-10-CM

## 2021-12-19 LAB — CBC WITH DIFFERENTIAL (CANCER CENTER ONLY)
Abs Immature Granulocytes: 0.01 10*3/uL (ref 0.00–0.07)
Basophils Absolute: 0 10*3/uL (ref 0.0–0.1)
Basophils Relative: 0 %
Eosinophils Absolute: 0.2 10*3/uL (ref 0.0–0.5)
Eosinophils Relative: 3 %
HCT: 42.4 % (ref 39.0–52.0)
Hemoglobin: 14.6 g/dL (ref 13.0–17.0)
Immature Granulocytes: 0 %
Lymphocytes Relative: 31 %
Lymphs Abs: 2.1 10*3/uL (ref 0.7–4.0)
MCH: 31.5 pg (ref 26.0–34.0)
MCHC: 34.4 g/dL (ref 30.0–36.0)
MCV: 91.4 fL (ref 80.0–100.0)
Monocytes Absolute: 0.7 10*3/uL (ref 0.1–1.0)
Monocytes Relative: 11 %
Neutro Abs: 3.8 10*3/uL (ref 1.7–7.7)
Neutrophils Relative %: 55 %
Platelet Count: 290 10*3/uL (ref 150–400)
RBC: 4.64 MIL/uL (ref 4.22–5.81)
RDW: 13.4 % (ref 11.5–15.5)
WBC Count: 6.9 10*3/uL (ref 4.0–10.5)
nRBC: 0 % (ref 0.0–0.2)

## 2021-12-19 LAB — CMP (CANCER CENTER ONLY)
ALT: 24 U/L (ref 0–44)
AST: 20 U/L (ref 15–41)
Albumin: 4.2 g/dL (ref 3.5–5.0)
Alkaline Phosphatase: 57 U/L (ref 38–126)
Anion gap: 7 (ref 5–15)
BUN: 25 mg/dL — ABNORMAL HIGH (ref 8–23)
CO2: 27 mmol/L (ref 22–32)
Calcium: 10 mg/dL (ref 8.9–10.3)
Chloride: 104 mmol/L (ref 98–111)
Creatinine: 1.2 mg/dL (ref 0.61–1.24)
GFR, Estimated: >60 mL/min (ref >60)
Glucose, Bld: 92 mg/dL (ref 70–99)
Potassium: 4.3 mmol/L (ref 3.5–5.1)
Sodium: 138 mmol/L (ref 135–145)
Total Bilirubin: 0.7 mg/dL (ref 0.3–1.2)
Total Protein: 7.6 g/dL (ref 6.5–8.1)

## 2021-12-19 LAB — LACTATE DEHYDROGENASE: LDH: 134 U/L (ref 98–192)

## 2021-12-19 NOTE — Progress Notes (Signed)
Hematology and Oncology Follow Up Visit  Edwin Padilla [id number] 07/20/1951 71 y.o. 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 is Back for follow-up.  We last saw him back in August.  Since then, he has been doing quite well.  He did have a wonderful time over in [place].  He went to [place].  He had a wonderful time there.  This was in November.  He might be going back to [place] in the springtime for a alpine hiking trip.  We last saw him back in August, his IgM level was 2000 mg/dL.  This has been holding pretty steady.  The Kappa light chain was 12.6 mg/dL.  We are watch both of these.  He has had no headache.  He has had no blurred vision.  He has had no rashes.  There has been no change in bowel or bladder habits.  The HIV really has not been a problem for him.  He has had no issues with COVID.  Overall, his performance status is ECOG 0.     Medications:  Current Outpatient Medications:    albuterol (VENTOLIN HFA) 108 (90 Base) MCG/ACT inhaler, Inhale 1-2 puffs into the lungs every 6 (six) hours as needed for wheezing or shortness of breath., Disp: 1 each, Rfl: 0   aspirin 81 MG chewable tablet, Chew 81 mg by mouth daily., Disp: , Rfl:    atorvastatin (LIPITOR) 40 MG tablet, Take 1 tablet (40 mg total) by mouth daily., Disp: 90 tablet, Rfl: 3   BIKTARVY 50-200-25 MG TABS tablet, TAKE 1 TABLET BY MOUTH DAILY., Disp: 90 tablet, Rfl: 1   losartan (COZAAR) 25 MG tablet, Take 1 tablet (25 mg total) by mouth daily. Please keep upcoming appt in April 2023 with Dr. Tamala Julian before anymore refills. Thank you Final attempt, Disp: 90 tablet, Rfl: 1   tamsulosin (FLOMAX) 0.4 MG CAPS capsule, 1 tab po q supper, Disp: 30 capsule, Rfl: 1  Allergies:  Allergies  Allergen Reactions   Cat Hair Extract Shortness Of Breath   Lisinopril Cough    cough    Past Medical History, Surgical history,  Social history, and Family History were reviewed and updated.  Review of Systems: Review of Systems  Constitutional: Negative.   HENT:  Negative.    Eyes: Negative.   Respiratory: Negative.    Cardiovascular: Negative.   Gastrointestinal: Negative.   Endocrine: Negative.   Genitourinary: Negative.    Musculoskeletal: Negative.   Skin: Negative.   Neurological: Negative.   Hematological: Negative.   Psychiatric/Behavioral: Negative.     Physical Exam:  height is _0  (1.778 m) and weight is 161 lb (73 kg). His oral temperature is 97.6 F (36.4 C). His blood pressure is 113/80 and his pulse is 58 (abnormal). His respiration is 18 and oxygen saturation is 97%.   Wt Readings from Last 3 Encounters:  12/19/21 161 lb (73 kg)  08/29/21 162 lb 12.8 oz (73.8 kg)  08/25/21 162 lb 12.8 oz (73.8 kg)    Physical Exam Vitals reviewed.  HENT:     Head: Normocephalic and atraumatic.  Eyes:     Pupils: Pupils are equal, round, and reactive to light.  Cardiovascular:     Rate and Rhythm: Normal rate and regular rhythm.     Heart sounds: Normal heart sounds.  Pulmonary:     Effort: Pulmonary effort is normal.  Breath sounds: Normal breath sounds.  Abdominal:     General: Bowel sounds are normal.     Palpations: Abdomen is soft.  Musculoskeletal:        General: No tenderness or deformity. Normal range of motion.     Cervical back: Normal range of motion.  Lymphadenopathy:     Cervical: No cervical adenopathy.  Skin:    General: Skin is warm and dry.     Findings: No erythema or rash.  Neurological:     Mental Status: He is alert and oriented to person, place, and time.  Psychiatric:        Behavior: Behavior normal.        Thought Content: Thought content normal.        Judgment: Judgment normal.     Lab Results  Component Value Date   WBC 6.9 12/19/2021   HGB 14.6 12/19/2021   HCT 42.4 12/19/2021   MCV 91.4 12/19/2021   PLT 290 12/19/2021     Chemistry       Component Value Date/Time   NA 138 12/19/2021 0857   K 4.3 12/19/2021 0857   CL 104 12/19/2021 0857   CO2 27 12/19/2021 0857   BUN 25 (H) 12/19/2021 0857   CREATININE 1.20 12/19/2021 0857   CREATININE 1.20 08/16/2021 0908      Component Value Date/Time   CALCIUM 10.0 12/19/2021 0857   ALKPHOS 57 12/19/2021 0857   AST 20 12/19/2021 0857   ALT 24 12/19/2021 0857   BILITOT 0.7 12/19/2021 0857     Impression and Plan: Edwin Padilla is a very nice 71 year old white male.  He just turned 71 years old.  He has what some appears to be is some type of "smoldering" lymphoproliferative process.  I am sure this probably will be some type of lymphoplasmacytic lymphoma.    We will have to see what his IgM and kappa light chain levels are right now.  Ultimately, we may have to do a bone marrow biopsy on him.  He had had a CT scan done earlier in 2022 which really was unremarkable aside some nonspecific inguinal adenopathy which we actually biopsied.  I just would hate to have to embark upon therapy.  Hopefully, everything will be stable and we can just follow along.  I would like to get him back in another 4 months or so.  We can get him through the Winter.    Volanda Napoleon, MD 1/9/20239:52 AM

## 2021-12-20 LAB — IGG, IGA, IGM
IgA: 146 mg/dL (ref 61–437)
IgG (Immunoglobin G), Serum: 599 mg/dL — ABNORMAL LOW (ref 603–1613)
IgM (Immunoglobulin M), Srm: 2276 mg/dL — ABNORMAL HIGH (ref 20–172)

## 2021-12-20 LAB — KAPPA/LAMBDA LIGHT CHAINS
Kappa free light chain: 149.6 mg/L — ABNORMAL HIGH (ref 3.3–19.4)
Kappa, lambda light chain ratio: 19.95 — ABNORMAL HIGH (ref 0.26–1.65)
Lambda free light chains: 7.5 mg/L (ref 5.7–26.3)

## 2021-12-22 LAB — PROTEIN ELECTROPHORESIS, SERUM, WITH REFLEX
A/G Ratio: 1 (ref 0.7–1.7)
Albumin ELP: 3.8 g/dL (ref 2.9–4.4)
Alpha-1-Globulin: 0.2 g/dL (ref 0.0–0.4)
Alpha-2-Globulin: 0.6 g/dL (ref 0.4–1.0)
Beta Globulin: 0.7 g/dL (ref 0.7–1.3)
Gamma Globulin: 2.3 g/dL — ABNORMAL HIGH (ref 0.4–1.8)
Globulin, Total: 3.8 g/dL (ref 2.2–3.9)
M-Spike, %: 1 g/dL — ABNORMAL HIGH
SPEP Interpretation: 0
Total Protein ELP: 7.6 g/dL (ref 6.0–8.5)

## 2021-12-22 LAB — IMMUNOFIXATION REFLEX, SERUM
IgA: 162 mg/dL (ref 61–437)
IgG (Immunoglobin G), Serum: 641 mg/dL (ref 603–1613)
IgM (Immunoglobulin M), Srm: 2447 mg/dL — ABNORMAL HIGH (ref 20–172)

## 2022-01-09 IMAGING — CT CT ANGIO CHEST
2 of 6 series · 18 of 36 positions shown · IV contrast (omnipaque)
Comparison: None.

CLINICAL DATA: Chest pain

EXAM:
CT ANGIOGRAPHY CHEST WITH CONTRAST
TECHNIQUE: Multidetector CT imaging of the chest was performed using the
standard protocol during bolus administration of intravenous
contrast. Multiplanar CT image reconstructions and MIPs were
obtained to evaluate the vascular anatomy.
CONTRAST:  100mL OMNIPAQUE IOHEXOL 350 MG/ML SOLN

[Series 5: thins · axial · 0.67mm/px · z∈[+1423,+1681]mm · 17 of 292 slices shown]
[im 17/292  lung]
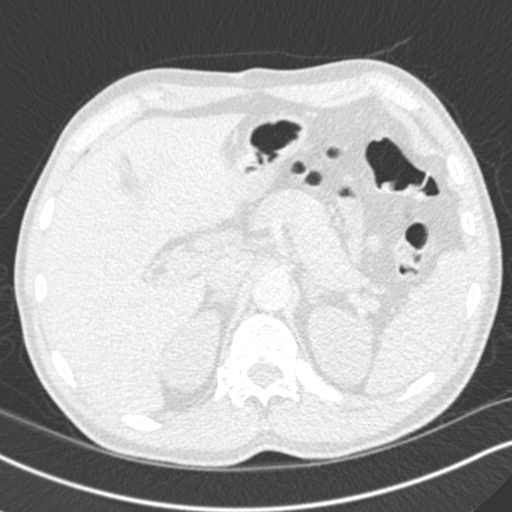
[im 33/292  mediastinal]
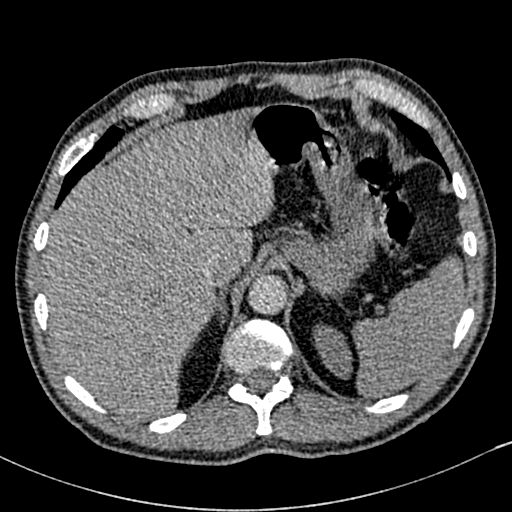
[im 49/292  lung]
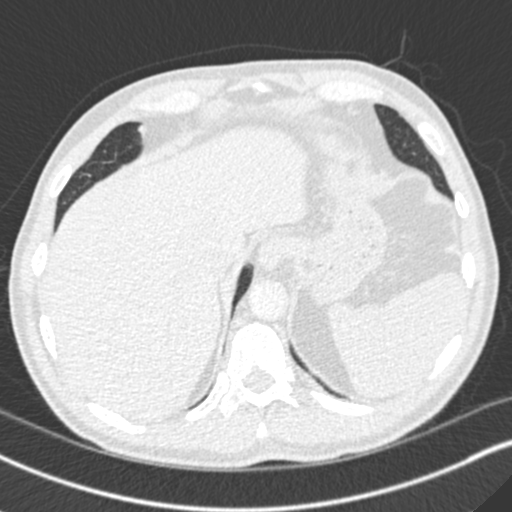
[im 65/292  mediastinal]
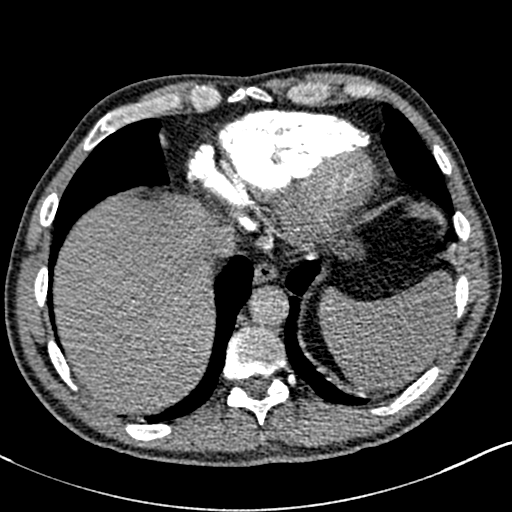
[im 81/292  lung]
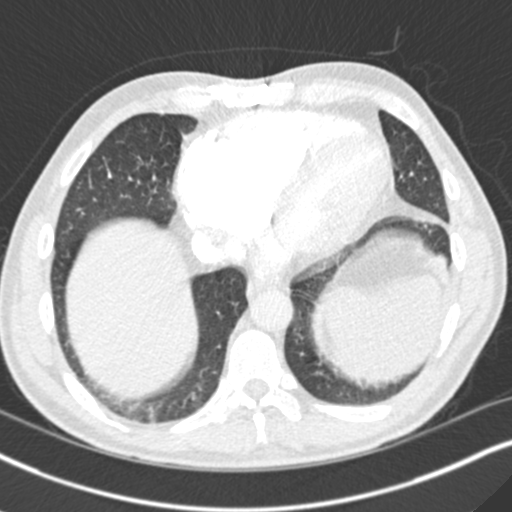
[im 98/292  mediastinal]
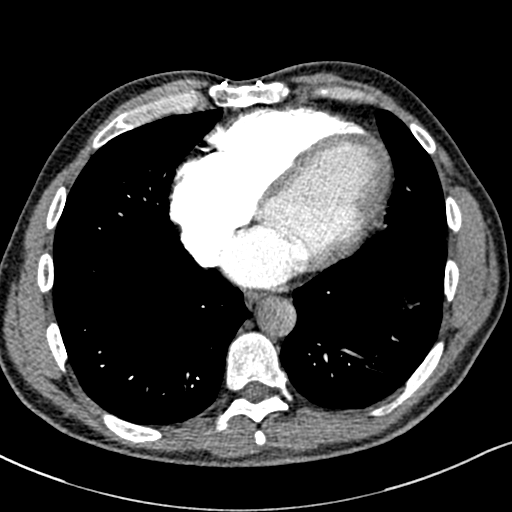
[im 114/292  lung]
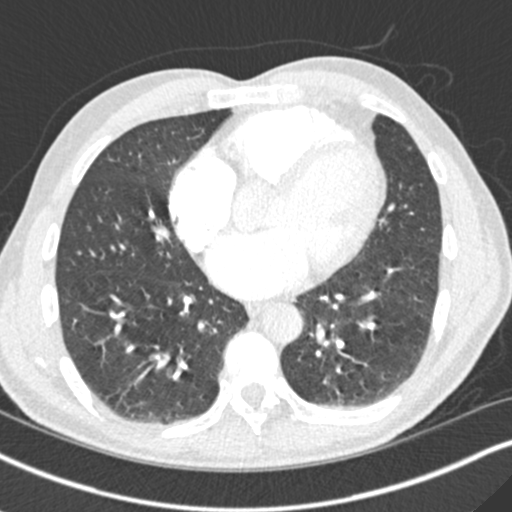
[im 130/292  mediastinal]
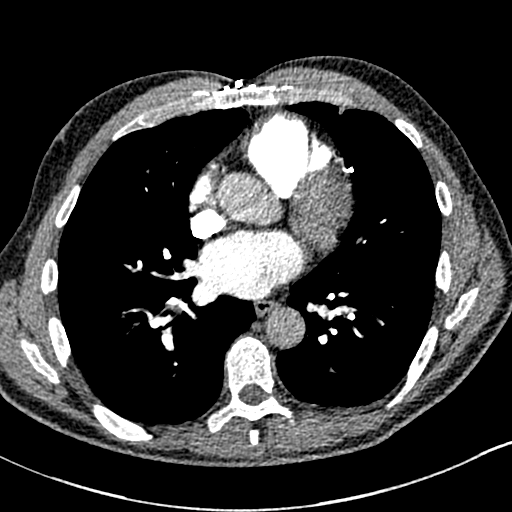
[im 146/292  lung]
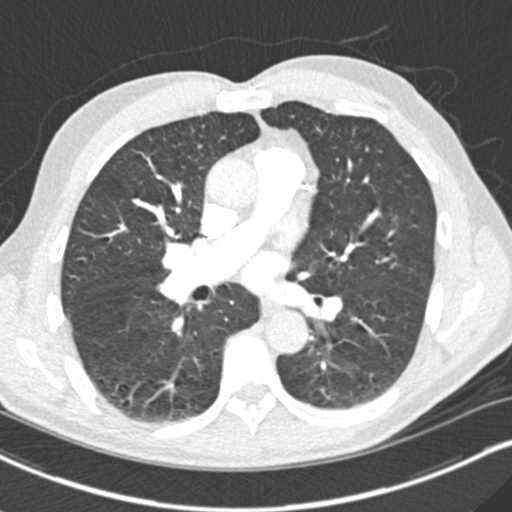
[im 162/292  mediastinal]
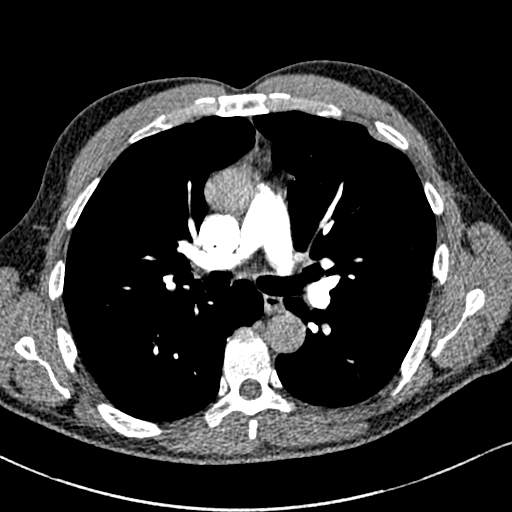
[im 178/292  lung]
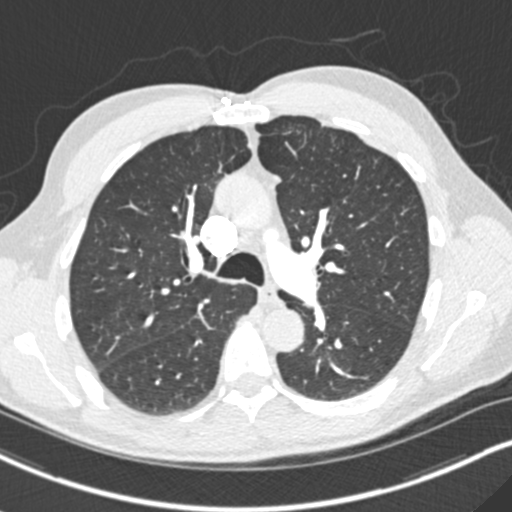
[im 195/292  mediastinal]
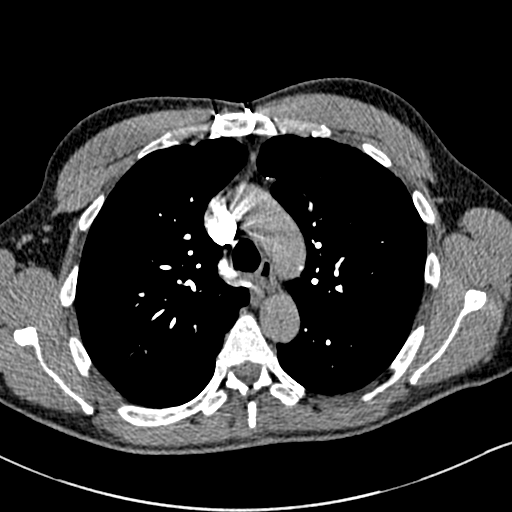
[im 211/292  lung]
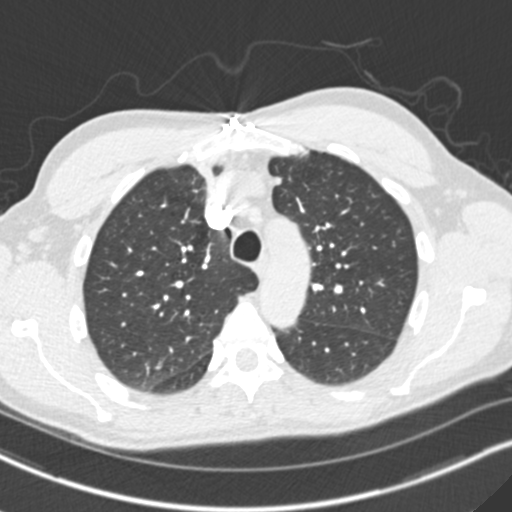
[im 227/292  mediastinal]
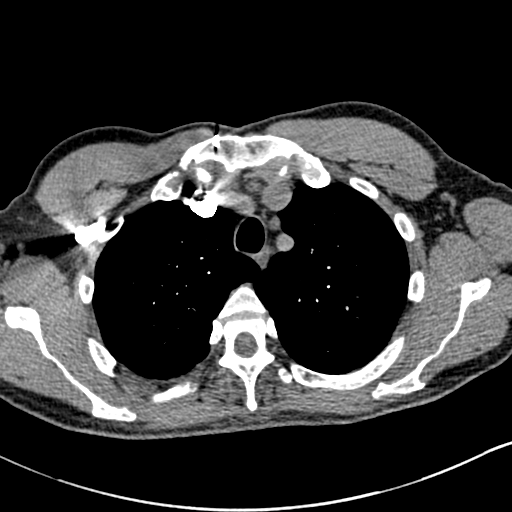
[im 243/292  lung]
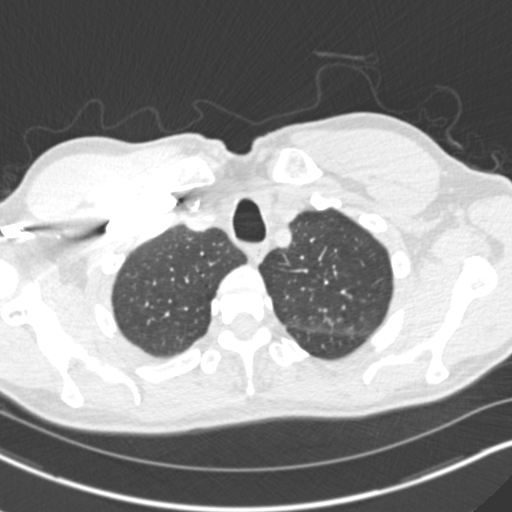
[im 259/292  mediastinal]
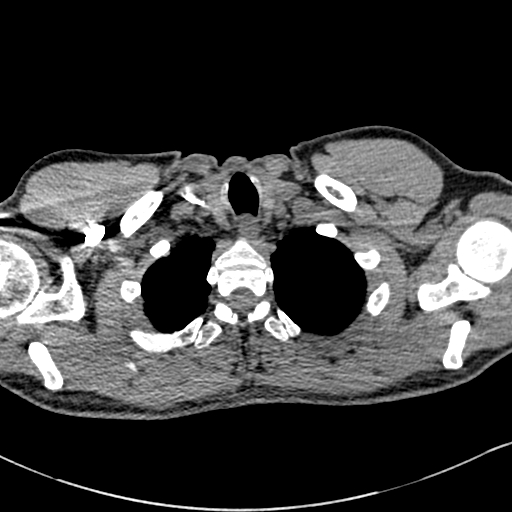
[im 275/292  lung]
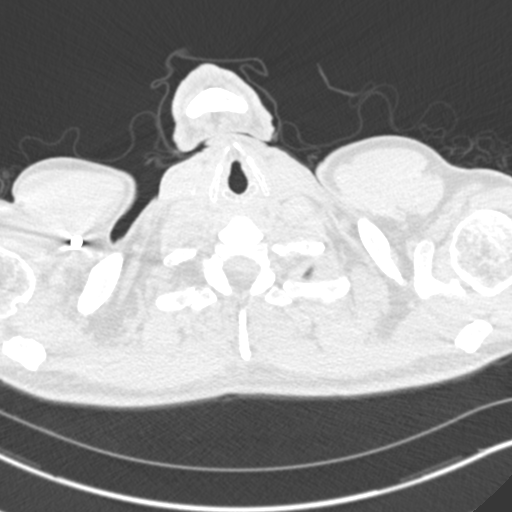

[Series 7: coronal mpr · coronal · 0.60mm/px · 1 of 132 slices shown]
[im 66/132  mediastinal]
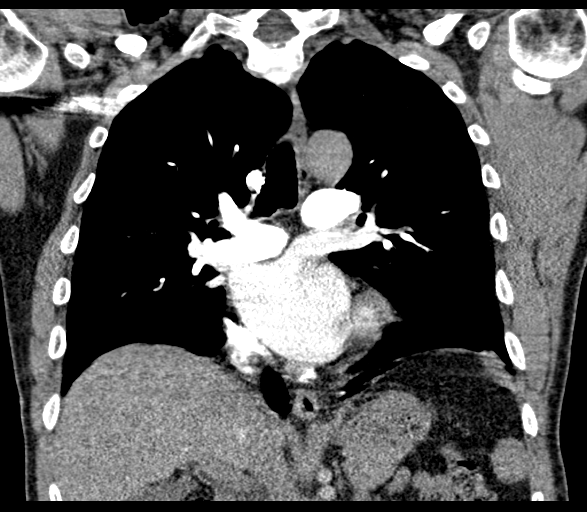

[18 of 36 positions shown; findings below may reference images not displayed]

FINDINGS: Cardiovascular: No filling defects in the pulmonary arteries to
suggest pulmonary emboli. Cardiomegaly. Coronary artery
calcifications, prior CABG. Aorta normal caliber.

Mediastinum/Nodes: No mediastinal, hilar, or axillary adenopathy.
Trachea and esophagus are unremarkable. Thyroid unremarkable.

Lungs/Pleura: No confluent opacities or effusions.

Upper Abdomen: Imaging into the upper abdomen shows no acute
findings.

Musculoskeletal: Chest wall soft tissues are unremarkable. No acute
bony abnormality.

Review of the MIP images confirms the above findings.
IMPRESSION: No evidence of pulmonary embolus.

Prior CABG.  Mild cardiomegaly.

No acute cardiopulmonary disease.

## 2022-03-19 NOTE — Progress Notes (Signed)
?Cardiology Office Note:   ? ?Date:  03/21/2022  ? ?ID:  Edwin Padilla, DOB 1951/05/06, MRN [id number] ? ?PCP:  Tammi Sou, MD  ?Cardiologist:  Edwin Grooms, MD  ? ?Referring MD: Tammi Sou, MD  ? ?Chief Complaint  ?Patient presents with  ? Coronary Artery Disease  ? Hyperlipidemia  ? ? ?History of Present Illness:   ? ?Edwin Padilla is a 71 y.o. male with a hx of CAD, 2004 CABG with LIMA to LAD and SVG to diagonal 1, hyperlipidemia, and treated HIV infection. ? ? ?[name] is doing well except he is under a lot of stress at work and also stressed because of a biopsy done on the lymph node that revealed atypical lymphoid cells raising question of myeloma or lymphoma.  His brother died of lymphoma. ? ?He continues to exercise vigorously.  No symptoms of ischemia, heart failure, or rhythm disturbance.  No nitroglycerin use has been required. ? ?Stress causes him to have restless nights.  He starts thinking about work which has been stressful because of staffing.  He has also been stressed because of his brothers history of lymphoma leading to a poor outcome.  This is being managed in a surveillance manner by Dr. Marin Olp. ? ?Past Medical History:  ?Diagnosis Date  ? Allergy   ? Cat allergy  ? Bilateral primary osteoarthritis of knee   ? Coronary atherosclerosis of unspecified type of vessel, native or graft   ? CABG 07/2003; annual cardiology f/u (Dr. Pernell Dupre).  Stable as of 02/2017 f/u--echo 03/2017 showed normal LV fxn---ok to d/c ACE-I per cardiologist.  ? COVID-19 virus infection 05/05/2021  ? paxlovid  ? HIV infection ([place]) Dx 02/2002  ? ID clinic w/ [facility].  ? Hyperlipidemia   ? 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 Lymphadenopathy, inguinal 08/08/2021  ? Inguinal LN bx 06/16/21->atypical lymphoprolif process, possible B cell lymphoprolif process, inc IgM as well-->Dr. Marin Olp following.  ? 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 hx of  ? Mild intermittent asthma   ? As of 2020, pt has not used  this in "years" but likes to keep it on hand in case he accidentally encounters cats  ? Myocardial infarction [facility]) 2004  ? Stress fracture of left hip 2020  ? running injury; sx's resolved with rest.  ? Substance abuse ([place])   ? hx of  ? ? ?Past Surgical History:  ?Procedure Laterality Date  ? CARDIOVASCULAR STRESS TEST  05/2016  ? No ischemia  ? COLONOSCOPY  01/2011  ? Normal (recall 10 yrs)  ? CORONARY ARTERY BYPASS GRAFT  2004  ? HERNIA REPAIR    ? as infant  ? INGUINAL HERNIA REPAIR Right 04/09/2013  ? Procedure: RIGHT HERNIA REPAIR INGUINAL ADULT;  Surgeon: Haywood Lasso, MD;  Location: [facility];  Service: General;  Laterality: Right;  ? LYMPH NODE BIOPSY Left 06/16/2021  ? Procedure: EXCISIONAL BIOPSY LEFT INGUINAL LYMPH NODE;  Surgeon: Coralie Keens, MD;  Location: [facility];  Service: General;  Laterality: Left;  ? TONSILLECTOMY    ? TRANSESOPHAGEAL ECHOCARDIOGRAM  12/2020  ? normal (mild av thickening)  ? TRANSTHORACIC ECHOCARDIOGRAM  03/19/2017  ? Normal  ? ? ?Current Medications: ?Current Meds  ?Medication Sig  ? albuterol (VENTOLIN HFA) 108 (90 Base) MCG/ACT inhaler Inhale 1-2 puffs into the lungs every 6 (six) hours as needed for wheezing or shortness of breath.  ? aspirin 81 MG chewable tablet Chew 81 mg  by mouth daily.  ? atorvastatin (LIPITOR) 40 MG tablet Take 1 tablet (40 mg total) by mouth daily.  ? BIKTARVY 50-200-25 MG TABS tablet TAKE 1 TABLET BY MOUTH DAILY.  ? losartan (COZAAR) 25 MG tablet Take 1 tablet (25 mg total) by mouth daily. Please keep upcoming appt in April 2023 with Dr. Tamala Julian before anymore refills. Thank you Final attempt  ?  ? ?Allergies:   Cat hair extract and Lisinopril  ? ?Social History  ? ?Socioeconomic History  ? Marital status: Single  ?  Spouse name: Not on file  ? Number of children: Not on file  ? Years of education: Not on file  ? Highest education level: Not on file  ?Occupational History  ? Not on file  ?Tobacco Use  ? Smoking  status: Former  ?  Years: 35.00  ?  Types: Cigarettes  ?  Quit date: 12/12/2003  ?  Years since quitting: 18.2  ? Smokeless tobacco: Never  ?Vaping Use  ? Vaping Use: Never used  ?Substance and Sexual Activity  ? Alcohol use: No  ?  Comment: 2005  ? Drug use: No  ?  Comment: past history of crystal meth addiction -clean since 2005   ? Sexual activity: Not Currently  ?  Comment: 2005  ?Other Topics Concern  ? Not on file  ?Social History Narrative  ? Homosexual.  ? Never married.  ? Hx of methamph abuse; no IV drug use.  [facility] remission drug program, no use since 2005.  ? Exercises regularly, says he eats a good diet.  ? Tob: quit 2004, 50 pack-yr hx.  ? No alcohol.  ? ?Social Determinants of Health  ? ?Financial Resource Strain: Low Risk   ? Difficulty of Paying Living Expenses: Not hard at all  ?Food Insecurity: No Food Insecurity  ? Worried About Charity fundraiser in the Last Year: Never true  ? Ran Out of Food in the Last Year: Never true  ?Transportation Needs: No Transportation Needs  ? Lack of Transportation (Medical): No  ? Lack of Transportation (Non-Medical): No  ?Physical Activity: Sufficiently Active  ? Days of Exercise per Week: 5 days  ? Minutes of Exercise per Session: 40 min  ?Stress: No Stress Concern Present  ? Feeling of Stress : Not at all  ?Social Connections: Socially Isolated  ? Frequency of Communication with Friends and Family: More than three times a week  ? Frequency of Social Gatherings with Friends and Family: Twice a week  ? Attends Religious Services: Never  ? Active Member of Clubs or Organizations: No  ? Attends Archivist Meetings: Never  ? Marital Status: Never married  ?  ? ?Family History: ?The patient's family history includes Cancer (age of onset: 34) in his brother; Colon cancer (age of onset: 76) in his father; Heart disease in his father; Heart failure in his father; Stroke (age of onset: 50) in his mother. ? ?ROS:   ?Please see the history of present illness.     ?He denies claudication, no change in exertional endurance, no PND.  Denies edema.  All other systems reviewed and are negative. ? ?EKGs/Labs/Other Studies Reviewed:   ? ?The following studies were reviewed today: ?No recent cardiovascular imaging. ? ?2D Doppler echocardiogram performed 12/16/2020: ? ?IMPRESSIONS  ? 1. Left ventricular ejection fraction, by estimation, is 55 to 60%. The  ?left ventricle has normal function. The left ventricle has no regional  ?wall motion abnormalities. Left ventricular diastolic parameters  were  ?normal.  ? 2. Right ventricular systolic function is normal. The right ventricular  ?size is normal. There is normal pulmonary artery systolic pressure. The  ?estimated right ventricular systolic pressure is 00.1 mmHg.  ? 3. Right atrial size was mildly dilated.  ? 4. The mitral valve is normal in structure. No evidence of mitral valve  ?regurgitation. No evidence of mitral stenosis.  ? 5. The aortic valve is tricuspid. There is mild thickening of the aortic  ?valve. Aortic valve regurgitation is not visualized. No aortic stenosis is  ?present.  ? 6. The inferior vena cava is normal in size with greater than 50%  ?respiratory variability, suggesting right atrial pressure of 3 mmHg.  ? ? ?EKG:  EKG sinus bradycardia at 48 bpm with first-degree heart block 212 ms.  EKG is otherwise normal.  When compared to the prior tracing from June 14, 2021, no significant changes noted. ? ?Recent Labs: ?12/19/2021: ALT 24; BUN 25; Creatinine 1.20; Hemoglobin 14.6; Platelet Count 290; Potassium 4.3; Sodium 138  ?Recent Lipid Panel ?   ?Component Value Date/Time  ? CHOL 124 08/16/2021 0908  ? CHOL 162 03/19/2017 0000  ? TRIG 55 08/16/2021 0908  ? HDL 42 08/16/2021 0908  ? HDL 58 03/19/2017 0000  ? CHOLHDL 3.0 08/16/2021 0908  ? VLDL 17 04/19/2016 0905  ? [place] 68 08/16/2021 0908  ? ? ?Physical Exam:   ? ?VS:  BP 112/80   Pulse (!) 48   Ht '5\' 10"'$  (1.778 m)   Wt 162 lb 3.2 oz (73.6 kg)   SpO2 96%   BMI  23.27 kg/m?    ? ?Wt Readings from Last 3 Encounters:  ?03/21/22 162 lb 3.2 oz (73.6 kg)  ?12/19/21 161 lb (73 kg)  ?08/29/21 162 lb 12.8 oz (73.8 kg)  ?  ? ?GEN: Healthy appearing with excellent weight cont

## 2022-03-21 ENCOUNTER — Encounter: Payer: Self-pay | Admitting: Interventional Cardiology

## 2022-03-21 ENCOUNTER — Ambulatory Visit (INDEPENDENT_AMBULATORY_CARE_PROVIDER_SITE_OTHER): Payer: BC Managed Care – PPO | Admitting: Interventional Cardiology

## 2022-03-21 VITALS — BP 112/80 | HR 48 | Ht 70.0 in | Wt 162.2 lb

## 2022-03-21 DIAGNOSIS — Z21 Asymptomatic human immunodeficiency virus [HIV] infection status: Secondary | ICD-10-CM

## 2022-03-21 DIAGNOSIS — I25708 Atherosclerosis of coronary artery bypass graft(s), unspecified, with other forms of angina pectoris: Secondary | ICD-10-CM | POA: Diagnosis not present

## 2022-03-21 DIAGNOSIS — E7849 Other hyperlipidemia: Secondary | ICD-10-CM | POA: Diagnosis not present

## 2022-03-21 DIAGNOSIS — D472 Monoclonal gammopathy: Secondary | ICD-10-CM

## 2022-03-21 NOTE — Patient Instructions (Signed)
Medication Instructions:  ?Your physician recommends that you continue on your current medications as directed. Please refer to the Current Medication list given to you today. ? ?*If you need a refill on your cardiac medications before your next appointment, please call your pharmacy* ? ? ?Lab Work: ?None ?If you have labs (blood work) drawn today and your tests are completely normal, you will receive your results only by: ?MyChart Message (if you have MyChart) OR ?A paper copy in the mail ?If you have any lab test that is abnormal or we need to change your treatment, we will call you to review the results. ? ? ?Testing/Procedures: ?None ? ? ?Follow-Up: ?At [facility], you and your health needs are our priority.  As part of our continuing mission to provide you with exceptional heart care, we have created designated Provider Care Teams.  These Care Teams include your primary Cardiologist (physician) and Advanced Practice Providers (APPs -  Physician Assistants and Nurse Practitioners) who all work together to provide you with the care you need, when you need it. ? ?We recommend signing up for the patient portal called "MyChart".  Sign up information is provided on this After Visit Summary.  MyChart is used to connect with patients for Virtual Visits (Telemedicine).  Patients are able to view lab/test results, encounter notes, upcoming appointments, etc.  Non-urgent messages can be sent to your provider as well.   ?To learn more about what you can do with MyChart, go to [email or web address].   ? ?Your next appointment:   ?1 year(s) ? ?The format for your next appointment:   ?In Person ? ?Provider:   ?Henry W Smith III, MD  ? ? ?Other Instructions ? ?Important Information About Sugar ? ? ? ? ?  ?

## 2022-04-17 ENCOUNTER — Inpatient Hospital Stay (HOSPITAL_BASED_OUTPATIENT_CLINIC_OR_DEPARTMENT_OTHER): Payer: BC Managed Care – PPO | Admitting: Hematology & Oncology

## 2022-04-17 ENCOUNTER — Inpatient Hospital Stay: Payer: BC Managed Care – PPO | Attending: Hematology & Oncology

## 2022-04-17 ENCOUNTER — Encounter: Payer: Self-pay | Admitting: Hematology & Oncology

## 2022-04-17 VITALS — BP 110/81 | HR 48 | Temp 98.3°F | Resp 20 | Wt 162.1 lb

## 2022-04-17 DIAGNOSIS — C880 Waldenstrom macroglobulinemia: Secondary | ICD-10-CM | POA: Insufficient documentation

## 2022-04-17 DIAGNOSIS — D472 Monoclonal gammopathy: Secondary | ICD-10-CM

## 2022-04-17 LAB — CBC WITH DIFFERENTIAL (CANCER CENTER ONLY)
Abs Immature Granulocytes: 0.07 10*3/uL (ref 0.00–0.07)
Basophils Absolute: 0 10*3/uL (ref 0.0–0.1)
Basophils Relative: 0 %
Eosinophils Absolute: 0.2 10*3/uL (ref 0.0–0.5)
Eosinophils Relative: 3 %
HCT: 42.5 % (ref 39.0–52.0)
Hemoglobin: 14.7 g/dL (ref 13.0–17.0)
Immature Granulocytes: 1 %
Lymphocytes Relative: 31 %
Lymphs Abs: 2.1 10*3/uL (ref 0.7–4.0)
MCH: 31.2 pg (ref 26.0–34.0)
MCHC: 34.6 g/dL (ref 30.0–36.0)
MCV: 90.2 fL (ref 80.0–100.0)
Monocytes Absolute: 0.8 10*3/uL (ref 0.1–1.0)
Monocytes Relative: 11 %
Neutro Abs: 3.7 10*3/uL (ref 1.7–7.7)
Neutrophils Relative %: 54 %
Platelet Count: 288 10*3/uL (ref 150–400)
RBC: 4.71 MIL/uL (ref 4.22–5.81)
RDW: 13.3 % (ref 11.5–15.5)
WBC Count: 6.9 10*3/uL (ref 4.0–10.5)
nRBC: 0 % (ref 0.0–0.2)

## 2022-04-17 LAB — CMP (CANCER CENTER ONLY)
ALT: 24 U/L (ref 0–44)
AST: 20 U/L (ref 15–41)
Albumin: 4.2 g/dL (ref 3.5–5.0)
Alkaline Phosphatase: 49 U/L (ref 38–126)
Anion gap: 6 (ref 5–15)
BUN: 24 mg/dL — ABNORMAL HIGH (ref 8–23)
CO2: 29 mmol/L (ref 22–32)
Calcium: 9.9 mg/dL (ref 8.9–10.3)
Chloride: 103 mmol/L (ref 98–111)
Creatinine: 1.18 mg/dL (ref 0.61–1.24)
GFR, Estimated: >60 mL/min (ref >60)
Glucose, Bld: 102 mg/dL — ABNORMAL HIGH (ref 70–99)
Potassium: 4.4 mmol/L (ref 3.5–5.1)
Sodium: 138 mmol/L (ref 135–145)
Total Bilirubin: 0.7 mg/dL (ref 0.3–1.2)
Total Protein: 7.7 g/dL (ref 6.5–8.1)

## 2022-04-17 LAB — LACTATE DEHYDROGENASE: LDH: 124 U/L (ref 98–192)

## 2022-04-17 NOTE — Progress Notes (Signed)
?Hematology and Oncology Follow Up Visit ? ?Edwin Padilla ?[id number] ?1950/12/27 71 y.o. ?04/17/2022 ? ? ?Principle Diagnosis:  ?IgM smoldering lymphoplasmacytic lymphoma ? ?Current Therapy:   ?Observation ?    ?Interim History:  Edwin Padilla is Back for follow-up.  We last saw him back in January.  Since then, he has been working quite a bit.  He is under some stress at work because of the work load. ? ?He is not can to go to [place].  Actually, he is going go to the [facility].  He will be going there in late May.  He will be going out there to see some friends and also to see the [employer].  Very jealous of that. ? ?As far as his lymphoplasmacytic lymphoma goes, this has been slowly trending upward.  His last M spike back exam was 1 g/dL.  The IgM level was 2300 mg/dL.  The Kappa light chain was 15 mg/dL. ? ?He has had no problem with infections.  He says he has felt a swollen lymph node in the left groin area.  This does tend to come and go. ? ?He has had no fever.  He has had no bleeding.  There is been no change in bowel or bladder habits.  He has had no leg swelling. ? ?He obviously is active.  He does a lot of running. ? ?Overall, his performance status is ECOG 0.   ? ? ?Medications:  ?Current Outpatient Medications:  ?  albuterol (VENTOLIN HFA) 108 (90 Base) MCG/ACT inhaler, Inhale 1-2 puffs into the lungs every 6 (six) hours as needed for wheezing or shortness of breath., Disp: 1 each, Rfl: 0 ?  aspirin 81 MG chewable tablet, Chew 81 mg by mouth daily., Disp: , Rfl:  ?  atorvastatin (LIPITOR) 40 MG tablet, Take 1 tablet (40 mg total) by mouth daily., Disp: 90 tablet, Rfl: 3 ?  BIKTARVY 50-200-25 MG TABS tablet, TAKE 1 TABLET BY MOUTH DAILY., Disp: 90 tablet, Rfl: 1 ?  losartan (COZAAR) 25 MG tablet, Take 1 tablet (25 mg total) by mouth daily. Please keep upcoming appt in April 2023 with Dr. Tamala Julian before anymore refills. Thank you Final attempt, Disp: 90 tablet, Rfl: 1 ? ?Allergies:  ?Allergies  ?Allergen  Reactions  ? Cat Hair Extract Shortness Of Breath  ? Lisinopril Cough  ?  cough  ? ? ?Past Medical History, Surgical history, Social history, and Family History were reviewed and updated. ? ?Review of Systems: ?Review of Systems  ?Constitutional: Negative.   ?HENT:  Negative.    ?Eyes: Negative.   ?Respiratory: Negative.    ?Cardiovascular: Negative.   ?Gastrointestinal: Negative.   ?Endocrine: Negative.   ?Genitourinary: Negative.    ?Musculoskeletal: Negative.   ?Skin: Negative.   ?Neurological: Negative.   ?Hematological: Negative.   ?Psychiatric/Behavioral: Negative.    ? ?Physical Exam: ? weight is 162 lb 1.3 oz (73.5 kg). His oral temperature is 98.3 ?F (36.8 ?C). His blood pressure is 110/81 and his pulse is 48 (abnormal). His respiration is 20 and oxygen saturation is 98%.  ? ?Wt Readings from Last 3 Encounters:  ?04/17/22 162 lb 1.3 oz (73.5 kg)  ?03/21/22 162 lb 3.2 oz (73.6 kg)  ?12/19/21 161 lb (73 kg)  ? ? ?Physical Exam ?Vitals reviewed.  ?HENT:  ?   Head: Normocephalic and atraumatic.  ?Eyes:  ?   Pupils: Pupils are equal, round, and reactive to light.  ?Cardiovascular:  ?   Rate and Rhythm:  Normal rate and regular rhythm.  ?   Heart sounds: Normal heart sounds.  ?Pulmonary:  ?   Effort: Pulmonary effort is normal.  ?   Breath sounds: Normal breath sounds.  ?Abdominal:  ?   General: Bowel sounds are normal.  ?   Palpations: Abdomen is soft.  ?Musculoskeletal:     ?   General: No tenderness or deformity. Normal range of motion.  ?   Cervical back: Normal range of motion.  ?Lymphadenopathy:  ?   Cervical: No cervical adenopathy.  ?Skin: ?   General: Skin is warm and dry.  ?   Findings: No erythema or rash.  ?Neurological:  ?   Mental Status: He is alert and oriented to person, place, and time.  ?Psychiatric:     ?   Behavior: Behavior normal.     ?   Thought Content: Thought content normal.     ?   Judgment: Judgment normal.  ? ? ? ?Lab Results  ?Component Value Date  ? WBC 6.9 04/17/2022  ? HGB 14.7  04/17/2022  ? HCT 42.5 04/17/2022  ? MCV 90.2 04/17/2022  ? PLT 288 04/17/2022  ? ?  Chemistry   ?   ?Component Value Date/Time  ? NA 138 04/17/2022 0904  ? K 4.4 04/17/2022 0904  ? CL 103 04/17/2022 0904  ? CO2 29 04/17/2022 0904  ? BUN 24 (H) 04/17/2022 0904  ? CREATININE 1.18 04/17/2022 0904  ? CREATININE 1.20 08/16/2021 0908  ?    ?Component Value Date/Time  ? CALCIUM 9.9 04/17/2022 0904  ? ALKPHOS 49 04/17/2022 0904  ? AST 20 04/17/2022 0904  ? ALT 24 04/17/2022 0904  ? BILITOT 0.7 04/17/2022 0904  ?  ? ?Impression and Plan: ?Edwin Padilla is a very nice 71 year old white male.  He is holding his own.  So far, we have not had to treat this lymphoplasmacytic lymphoma.  He has been asymptomatic with this. ? ?So far, I do not see anything with respect to his physical exam that would be troublesome.  His labs look okay.  The total protein is up a little bit which could indicate a rise in the lymphoma.  However, I think that we can still be conservative. ? ?I think we can try to get him back after Labor Day.  I think this is reasonable.   ? ? ?Volanda Napoleon, MD ?5/8/20239:51 AM  ?

## 2022-04-18 LAB — IGG, IGA, IGM
IgA: 82 mg/dL (ref 61–437)
IgG (Immunoglobin G), Serum: 455 mg/dL — ABNORMAL LOW (ref 603–1613)
IgM (Immunoglobulin M), Srm: 2246 mg/dL — ABNORMAL HIGH (ref 20–172)

## 2022-04-18 LAB — KAPPA/LAMBDA LIGHT CHAINS
Kappa free light chain: 159.3 mg/L — ABNORMAL HIGH (ref 3.3–19.4)
Kappa, lambda light chain ratio: 26.11 — ABNORMAL HIGH (ref 0.26–1.65)
Lambda free light chains: 6.1 mg/L (ref 5.7–26.3)

## 2022-04-19 ENCOUNTER — Other Ambulatory Visit: Payer: Self-pay

## 2022-04-19 ENCOUNTER — Ambulatory Visit (INDEPENDENT_AMBULATORY_CARE_PROVIDER_SITE_OTHER): Payer: BC Managed Care – PPO

## 2022-04-19 DIAGNOSIS — Z23 Encounter for immunization: Secondary | ICD-10-CM

## 2022-04-19 NOTE — Progress Notes (Signed)
? ?  Covid-19 Vaccination Clinic ? ?Name:  Edwin Padilla    ?MRN: [id number] ?DOB: [birth year] ? ?04/19/2022 ? ?Edwin Padilla declined observation for 15 minute time period. Vaccine administered without incident; patient confirmed no prior reactions to any Covid vaccines. Vaccine Information Sheet offered but declined by patient.  ? ?Edwin Padilla was instructed to call 911 with any severe reactions post vaccine: ?Difficulty breathing  ?Swelling of face and throat  ?A fast heartbeat  ?A bad rash all over body  ?Dizziness and weakness  ? ?Immunizations Administered   ? ? Name Date Dose VIS Date Route  ? Ambulance person Booster 04/19/2022 10:33 AM 0.3 mL 08/10/2021 Intramuscular  ? Manufacturer: [place]: [id number]  ? [place]: [phone]  ? ?  ?  ?Binnie Kand, RN  ?

## 2022-04-21 LAB — PROTEIN ELECTROPHORESIS, SERUM, WITH REFLEX
A/G Ratio: 1.1 (ref 0.7–1.7)
Albumin ELP: 4 g/dL (ref 2.9–4.4)
Alpha-1-Globulin: 0.2 g/dL (ref 0.0–0.4)
Alpha-2-Globulin: 0.6 g/dL (ref 0.4–1.0)
Beta Globulin: 0.7 g/dL (ref 0.7–1.3)
Gamma Globulin: 2 g/dL — ABNORMAL HIGH (ref 0.4–1.8)
Globulin, Total: 3.5 g/dL (ref 2.2–3.9)
M-Spike, %: 0.7 g/dL — ABNORMAL HIGH
SPEP Interpretation: 0
Total Protein ELP: 7.5 g/dL (ref 6.0–8.5)

## 2022-04-21 LAB — IMMUNOFIXATION REFLEX, SERUM
IgA: 127 mg/dL (ref 61–437)
IgG (Immunoglobin G), Serum: 595 mg/dL — ABNORMAL LOW (ref 603–1613)
IgM (Immunoglobulin M), Srm: 2317 mg/dL — ABNORMAL HIGH (ref 20–172)

## 2022-04-24 ENCOUNTER — Encounter: Payer: Self-pay | Admitting: *Deleted

## 2022-05-11 ENCOUNTER — Telehealth: Payer: Self-pay

## 2022-05-11 NOTE — Telephone Encounter (Signed)
Called pt to schedule AWV. Please schedule with health coach, Toria or Demone Lyles. ? ?

## 2022-05-31 ENCOUNTER — Ambulatory Visit (INDEPENDENT_AMBULATORY_CARE_PROVIDER_SITE_OTHER): Payer: BC Managed Care – PPO

## 2022-05-31 DIAGNOSIS — Z0000 Encounter for general adult medical examination without abnormal findings: Secondary | ICD-10-CM

## 2022-05-31 DIAGNOSIS — Z1211 Encounter for screening for malignant neoplasm of colon: Secondary | ICD-10-CM

## 2022-05-31 DIAGNOSIS — Z800 Family history of malignant neoplasm of digestive organs: Secondary | ICD-10-CM

## 2022-05-31 NOTE — Progress Notes (Addendum)
Virtual Visit via Telephone Note  I connected with  Edwin Padilla on 05/31/22 at 11:00 AM EDT by telephone and verified that I am speaking with the correct person using two identifiers.  Medicare Annual Wellness visit completed telephonically due to Covid-19 pandemic.   Persons participating in this call: This Health Coach and this patient.   Location: Patient: home Provider: Office   I discussed the limitations, risks, security and privacy concerns of performing an evaluation and management service by telephone and the availability of in person appointments. The patient expressed understanding and agreed to proceed.  Unable to perform video visit due to video visit attempted and failed and/or patient does not have video capability.   Some vital signs may be absent or patient reported.   Willette Brace, LPN  Subjective:   Edwin Padilla is a 71 y.o. male who presents for Medicare Annual/Subsequent preventive examination.  Review of Systems     Cardiac Risk Factors include: advanced age (>26mn, >>24women);dyslipidemia;male gender     Objective:    There were no vitals filed for this visit. There is no height or weight on file to calculate BMI.     05/31/2022   10:53 AM 04/17/2022    9:39 AM 12/19/2021    9:25 AM 08/08/2021    9:02 AM 06/16/2021    6:28 AM 06/03/2021   11:16 AM 05/18/2021    9:51 AM  Advanced Directives  Does Patient Have a Medical Advance Directive? Yes Yes Yes Yes Yes Yes Yes  Type of AParamedicof [place]West [place]Living will Living will Living will HSan PatricioLiving will HAndersonLiving will HBuchananLiving will HTammsLiving will  Does patient want to make changes to medical advance directive?   No - Patient declined   No - Patient declined No - Patient declined  Copy of [place]ake [place]in Chart? No - copy requested   No - copy requested   No - copy  requested  Would patient like information on creating a medical advance directive?      No - Patient declined     Current Medications (verified) Outpatient Encounter Medications as of 05/31/2022  Medication Sig   albuterol (VENTOLIN HFA) 108 (90 Base) MCG/ACT inhaler Inhale 1-2 puffs into the lungs every 6 (six) hours as needed for wheezing or shortness of breath.   aspirin 81 MG chewable tablet Chew 81 mg by mouth daily.   atorvastatin (LIPITOR) 40 MG tablet Take 1 tablet (40 mg total) by mouth daily.   BIKTARVY 50-200-25 MG TABS tablet TAKE 1 TABLET BY MOUTH DAILY.   losartan (COZAAR) 25 MG tablet Take 1 tablet (25 mg total) by mouth daily. Please keep upcoming appt in April 2023 with Dr. STamala Julianbefore anymore refills. Thank you Final attempt   No facility-administered encounter medications on file as of 05/31/2022.    Allergies (verified) Cat hair extract and Lisinopril   History: Past Medical History:  Diagnosis Date   Allergy    Cat allergy   Bilateral primary osteoarthritis of knee    Coronary atherosclerosis of unspecified type of vessel, native or graft    CABG 07/2003; annual cardiology f/u (Dr. HPernell Dupre.  Stable as of 02/2017 f/u--echo 03/2017 showed normal LV fxn---ok to d/c ACE-I per cardiologist.   COVID-19 virus infection 05/05/2021   paxlovid   [place]   ID clinic w/ [facility].   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 Marin Olp following.   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   Substance abuse ([place])    hx of    Past Surgical History:  Procedure Laterality Date   CARDIOVASCULAR STRESS TEST  05/2016   No ischemia   COLONOSCOPY  01/2011   Normal (recall 10 yrs)   CORONARY ARTERY BYPASS GRAFT  2004   HERNIA REPAIR     as infant   INGUINAL HERNIA REPAIR Right 04/09/2013   Procedure: RIGHT HERNIA REPAIR INGUINAL ADULT;  Surgeon: Haywood Lasso, MD;  Location: [place];  Service: General;  Laterality: Right;   LYMPH NODE BIOPSY Left 06/16/2021   Procedure: EXCISIONAL BIOPSY LEFT INGUINAL LYMPH NODE;  Surgeon: Coralie Keens, MD;  Location: [place];  Service: General;  Laterality: Left;   TONSILLECTOMY     TRANSESOPHAGEAL ECHOCARDIOGRAM  12/2020   normal (mild av thickening)   TRANSTHORACIC ECHOCARDIOGRAM  03/19/2017   Normal   Family History  Problem Relation Age of Onset   Stroke Mother 51   Heart failure Father    Heart disease Father    Colon cancer Father 15   Cancer Brother 24       lymphoma   Social History   Socioeconomic History   Marital status: Single    Spouse name: Not on file   Number of children: Not on file   Years of education: Not on file   Highest education level: Not on file  Occupational History   Not on file  Tobacco Use   Smoking status: Former    Years: 35.00    Types: Cigarettes    Quit date: 12/12/2003    Years since quitting: 18.4   Smokeless tobacco: Never  Vaping Use   Vaping Use: Never used  Substance and Sexual Activity   Alcohol use: No    Comment: 2005   Drug use: No    Comment: past history of crystal meth addiction -clean since 2005    Sexual activity: Not Currently    Comment: 2005  Other Topics Concern   Not on file  Social History Narrative   Homosexual.   Never married.   Hx of methamph abuse; no IV drug use.  [place] remission drug program, no use since 2005.   Exercises regularly, says he eats a good diet.   Tob: quit 2004, 50 pack-yr hx.   No alcohol.   Social Determinants of Health    Financial Resource Strain: Low Risk  (05/18/2021)   Overall Financial Resource Strain (CARDIA)    Difficulty of Paying Living Expenses: Not hard at all  Food Insecurity: No Food Insecurity (05/18/2021)   Hunger Vital Sign    Worried About Running Out of Food in the Last Year: Never true    Ran Out of Food in the Last Year: Never true  Transportation Needs: No Transportation Needs (05/18/2021)   PRAPARE - Hydrologist (Medical): No    Lack of Transportation (Non-Medical): No  Physical Activity: Insufficiently  Active (05/31/2022)   Exercise Vital Sign    Days of Exercise per Week: 4 days    Minutes of Exercise per Session: 30 min  Stress: Stress Concern Present (05/31/2022)   [place]    Feeling of Stress : To some extent  Social Connections: Socially Isolated (05/31/2022)   Social Connection and Isolation Panel [NHANES]    Frequency of Communication with Friends and Family: More than three times a week    Frequency of Social Gatherings with Friends and Family: Twice a week    Attends Religious Services: Never    Marine scientist or Organizations: No    Attends Music therapist: Never    Marital Status: Never married    Tobacco Counseling Counseling given: Not Answered   Clinical Intake:  Pre-visit preparation completed: Yes  Pain : No/denies pain     BMI - recorded: 23.26 Nutritional Status: BMI of 19-24  Normal Nutritional Risks: None Diabetes: No  How often do you need to have someone help you when you read instructions, pamphlets, or other written materials from your doctor or pharmacy?: 1 - Never  Diabetic?no  Interpreter Needed?: No  Information entered by :: Charlott Rakes, LPN   Activities of Daily Living    05/31/2022   10:54 AM 06/16/2021    6:37 AM  In your present state of health, do you have any difficulty performing the following activities:   Hearing? 0 0  Vision? 0 0  Difficulty concentrating or making decisions? 0 0  Walking or climbing stairs? 0 0  Dressing or bathing? 0 0  Doing errands, shopping? 0   Preparing Food and eating ? N   Using the Toilet? N   In the past six months, have you accidently leaked urine? N   Do you have problems with loss of bowel control? N   Managing your Medications? N   Managing your Finances? N   Housekeeping or managing your Housekeeping? N     Patient Care Team: Tammi Sou, MD as PCP - General (Family Medicine) Belva Crome, MD as PCP - Cardiology (Cardiology) Belva Crome, MD (Cardiology) Neldon Mc, MD (General Surgery) Garlan Fair, MD (Gastroenterology) Comer, Okey Regal, MD as Consulting Physician (Infectious Diseases) Belva Crome, MD as Consulting Physician (Cardiology) Druscilla Brownie, MD as Consulting Physician (Dermatology) Dene Gentry, MD as Consulting Physician (Sports Medicine) Coralie Keens, MD as Consulting Physician (General Surgery) Volanda Napoleon, MD as Consulting Physician (Oncology)  Indicate any recent Medical Services you may have received from other than [facility] providers in the past year (date may be approximate).     Assessment:   This is a routine wellness examination for [place].  Hearing/Vision screen Hearing Screening - Comments:: Pt denies any hearing issues  Vision Screening - Comments:: Pt follows up with [facility]   Dietary issues and exercise activities discussed: Current Exercise Habits: Home exercise routine, Type of exercise: Other - see comments, Time (Minutes): 30, Frequency (Times/Week): 4, Weekly Exercise (Minutes/Week): 120   Goals Addressed             This Visit's Progress    Patient Stated       Lose a couple lbs & resolve emotional challenges        Depression Screen    05/31/2022   10:52 AM 05/18/2021    9:59 AM 05/18/2021    9:51 AM 08/25/2020  8:10 AM 02/27/2020   11:15 AM  06/19/2018    8:51 AM 01/08/2018    8:19 AM  PHQ 2/9 Scores  PHQ - 2 Score 0 0 0 0 0 0 0    Fall Risk    05/31/2022   10:54 AM 05/18/2021    9:52 AM 03/04/2021   10:34 AM 02/01/2021   10:25 AM 08/26/2020    8:56 AM  Fall Risk   Falls in the past year? 0 0 0 0 0  Number falls in past yr: 0 0 0 0   Injury with Fall? 0 0 0 0   Risk for fall due to : Impaired vision    No Fall Risks  Follow up Falls prevention discussed Falls evaluation completed;Falls prevention discussed Falls evaluation completed Falls evaluation completed Falls evaluation completed    FALL RISK PREVENTION PERTAINING TO THE HOME:  Any stairs in or around the home? No  If so, are there any without handrails? No  Home free of loose throw rugs in walkways, pet beds, electrical cords, etc? Yes  Adequate lighting in your home to reduce risk of falls? Yes   ASSISTIVE DEVICES UTILIZED TO PREVENT FALLS:  Life alert? No  Use of a cane, walker or w/c? No  Grab bars in the bathroom? No  Shower chair or bench in shower? No  Elevated toilet seat or a handicapped toilet? No   TIMED UP AND GO:  Was the test performed? No .   Cognitive Function:        05/31/2022   10:55 AM  6CIT Screen  What Year? 0 points  What month? 0 points  What time? 0 points  Count back from 20 0 points  Months in reverse 0 points  Repeat phrase 0 points  Total Score 0 points    Immunizations Immunization History  Administered Date(s) Administered   Fluad Quad(high Dose 65+) 08/13/2019, 08/26/2020, 08/25/2021   Hepatitis A 01/29/2013   Hepatitis A, Adult 08/20/2013   Influenza Split 09/04/2012   Influenza,inj,Quad PF,6+ Mos 09/27/2017   Influenza-Unspecified 09/18/2013, 09/22/2015, 10/10/2018   PFIZER Comirnaty(Gray Top)Covid-19 Tri-Sucrose Vaccine 04/21/2021   PFIZER(Purple Top)SARS-COV-2 Vaccination 01/17/2020, 02/11/2020, 09/17/2020   PPD Test 08/11/2009, 08/09/2011   Pfizer Covid-19 Vaccine Bivalent Booster 91yr & up  09/05/2021, 04/19/2022   Pneumococcal Conjugate-13 03/15/2015   Pneumococcal Polysaccharide-23 08/03/2011, 02/20/2017   Tdap 09/07/2010   Zoster Recombinat (Shingrix) 09/12/2017, 09/16/2017   Zoster, Live 09/14/2014    TDAP status: Due, Education has been provided regarding the importance of this vaccine. Advised may receive this vaccine at local pharmacy or Health Dept. Aware to provide a copy of the vaccination record if obtained from local pharmacy or Health Dept. Verbalized acceptance and understanding.  Flu Vaccine status: Up to date  Pneumococcal vaccine status: Up to date  Covid-19 vaccine status: Completed vaccines  Qualifies for Shingles Vaccine? Yes   Zostavax completed Yes   Shingrix Completed?: No.    Education has been provided regarding the importance of this vaccine. Patient has been advised to call insurance company to determine out of pocket expense if they have not yet received this vaccine. Advised may also receive vaccine at local pharmacy or Health Dept. Verbalized acceptance and understanding.  Screening Tests Health Maintenance  Topic Date Due   Zoster Vaccines- Shingrix (2 of 2) 11/11/2017   TETANUS/TDAP  09/07/2020   COLONOSCOPY (Pts 45-428yrInsurance coverage will need to be confirmed)  01/19/2021   INFLUENZA VACCINE  07/11/2022   Pneumonia  Vaccine 11+ Years old  Completed   COVID-19 Vaccine  Completed   Hepatitis C Screening  Completed   HPV VACCINES  Aged Out    Health Maintenance  Health Maintenance Due  Topic Date Due   Zoster Vaccines- Shingrix (2 of 2) 11/11/2017   TETANUS/TDAP  09/07/2020   COLONOSCOPY (Pts 45-67yr Insurance coverage will need to be confirmed)  01/19/2021    Colorectal cancer screening: Referral to GI placed 05/31/22. Pt aware the office will call re: appt.    Additional Screening:  Hepatitis C Screening: Completed 02/16/14  Vision Screening: Recommended annual ophthalmology exams for early detection of glaucoma and  other disorders of the eye. Is the patient up to date with their annual eye exam?  Yes  Who is the provider or what is the name of the office in which the patient attends annual eye exams? [facility] If pt is not established with a provider, would they like to be referred to a provider to establish care? No .   Dental Screening: Recommended annual dental exams for proper oral hygiene  Community Resource Referral / Chronic Care Management: CRR required this visit?  No   CCM required this visit?  No      Plan:     I have personally reviewed and noted the following in the patient's chart:   Medical and social history Use of alcohol, tobacco or illicit drugs  Current medications and supplements including opioid prescriptions. Patient is not currently taking opioid prescriptions. Functional ability and status Nutritional status Physical activity Advanced directives List of other physicians Hospitalizations, surgeries, and ER visits in previous 12 months Vitals Screenings to include cognitive, depression, and falls Referrals and appointments  In addition, I have reviewed and discussed with patient certain preventive protocols, quality metrics, and best practice recommendations. A written personalized care plan for preventive services as well as general preventive health recommendations were provided to patient.     TWillette Brace LPN   [id number]  Nurse Notes: None

## 2022-05-31 NOTE — Patient Instructions (Addendum)
[name] , Thank you for taking time to come for your Medicare Wellness Visit. I appreciate your ongoing commitment to your health goals. Please review the following plan we discussed and let me know if I can assist you in the future.   Screening recommendations/referrals: Colonoscopy: Order placed 05/31/22 Recommended yearly ophthalmology/optometry visit for glaucoma screening and checkup Recommended yearly dental visit for hygiene and checkup  Vaccinations: Influenza vaccine: Done 08/25/21 repeat every year  Pneumococcal vaccine: Up to date Tdap vaccine: Due  Shingles vaccine: 1st dose 09/16/17    Covid-19: Completed 2/6, 3/3, 09/17/20, 5/12, 09/05/21 & 04/19/22  Advanced directives: Please bring a copy of your health care power of attorney and living will to the office at your convenience.  Conditions/risks identified: Lose a couple lbs and resolve emotional challenges with work   Next appointment: Follow up in one year for your annual wellness visit.   Preventive Care 23 Years and Older, Male Preventive care refers to lifestyle choices and visits with your health care provider that can promote health and wellness. What does preventive care include? A yearly physical exam. This is also called an annual well check. Dental exams once or twice a year. Routine eye exams. Ask your health care provider how often you should have your eyes checked. Personal lifestyle choices, including: Daily care of your teeth and gums. Regular physical activity. Eating a healthy diet. Avoiding tobacco and drug use. Limiting alcohol use. Practicing safe sex. Taking low doses of aspirin every day. Taking vitamin and mineral supplements as recommended by your health care provider. What happens during an annual well check? The services and screenings done by your health care provider during your annual well check will depend on your age, overall health, lifestyle risk factors, and family history of  disease. Counseling  Your health care provider may ask you questions about your: Alcohol use. Tobacco use. Drug use. Emotional well-being. Home and relationship well-being. Sexual activity. Eating habits. History of falls. Memory and ability to understand (cognition). Work and work Statistician. Screening  You may have the following tests or measurements: Height, weight, and BMI. Blood pressure. Lipid and cholesterol levels. These may be checked every 5 years, or more frequently if you are over 22 years old. Skin check. Lung cancer screening. You may have this screening every year starting at age 8 if you have a 30-pack-year history of smoking and currently smoke or have quit within the past 15 years. Fecal occult blood test (FOBT) of the stool. You may have this test every year starting at age 54. Flexible sigmoidoscopy or colonoscopy. You may have a sigmoidoscopy every 5 years or a colonoscopy every 10 years starting at age 38. Prostate cancer screening. Recommendations will vary depending on your family history and other risks. Hepatitis C blood test. Hepatitis B blood test. Sexually transmitted disease (STD) testing. Diabetes screening. This is done by checking your blood sugar (glucose) after you have not eaten for a while (fasting). You may have this done every 1-3 years. Abdominal aortic aneurysm (AAA) screening. You may need this if you are a current or former smoker. Osteoporosis. You may be screened starting at age 91 if you are at high risk. Talk with your health care provider about your test results, treatment options, and if necessary, the need for more tests. Vaccines  Your health care provider may recommend certain vaccines, such as: Influenza vaccine. This is recommended every year. Tetanus, diphtheria, and acellular pertussis (Tdap, Td) vaccine. You may need a  Td booster every 10 years. Zoster vaccine. You may need this after age 24. Pneumococcal 13-valent  conjugate (PCV13) vaccine. One dose is recommended after age 72. Pneumococcal polysaccharide (PPSV23) vaccine. One dose is recommended after age 55. Talk to your health care provider about which screenings and vaccines you need and how often you need them. This information is not intended to replace advice given to you by your health care provider. Make sure you discuss any questions you have with your health care provider. Document Released: 12/24/2015 Document Revised: 08/16/2016 Document Reviewed: 09/28/2015 Elsevier Interactive Patient Education  2017 [place] Prevention in the Home Falls can cause injuries. They can happen to people of all ages. There are many things you can do to make your home safe and to help prevent falls. What can I do on the outside of my home? Regularly fix the edges of walkways and driveways and fix any cracks. Remove anything that might make you trip as you walk through a door, such as a raised step or threshold. Trim any bushes or trees on the path to your home. Use bright outdoor lighting. Clear any walking paths of anything that might make someone trip, such as rocks or tools. Regularly check to see if handrails are loose or broken. Make sure that both sides of any steps have handrails. Any raised decks and porches should have guardrails on the edges. Have any leaves, snow, or ice cleared regularly. Use sand or salt on walking paths during winter. Clean up any spills in your garage right away. This includes oil or grease spills. What can I do in the bathroom? Use night lights. Install grab bars by the toilet and in the tub and shower. Do not use towel bars as grab bars. Use non-skid mats or decals in the tub or shower. If you need to sit down in the shower, use a plastic, non-slip stool. Keep the floor dry. Clean up any water that spills on the floor as soon as it happens. Remove soap buildup in the tub or shower regularly. Attach bath mats  securely with double-sided non-slip rug tape. Do not have throw rugs and other things on the floor that can make you trip. What can I do in the bedroom? Use night lights. Make sure that you have a light by your bed that is easy to reach. Do not use any sheets or blankets that are too big for your bed. They should not hang down onto the floor. Have a firm chair that has side arms. You can use this for support while you get dressed. Do not have throw rugs and other things on the floor that can make you trip. What can I do in the kitchen? Clean up any spills right away. Avoid walking on wet floors. Keep items that you use a lot in easy-to-reach places. If you need to reach something above you, use a strong step stool that has a grab bar. Keep electrical cords out of the way. Do not use floor polish or wax that makes floors slippery. If you must use wax, use non-skid floor wax. Do not have throw rugs and other things on the floor that can make you trip. What can I do with my stairs? Do not leave any items on the stairs. Make sure that there are handrails on both sides of the stairs and use them. Fix handrails that are broken or loose. Make sure that handrails are as long as the stairways. Check any  carpeting to make sure that it is firmly attached to the stairs. Fix any carpet that is loose or worn. Avoid having throw rugs at the top or bottom of the stairs. If you do have throw rugs, attach them to the floor with carpet tape. Make sure that you have a light switch at the top of the stairs and the bottom of the stairs. If you do not have them, ask someone to add them for you. What else can I do to help prevent falls? Wear shoes that: Do not have high heels. Have rubber bottoms. Are comfortable and fit you well. Are closed at the toe. Do not wear sandals. If you use a stepladder: Make sure that it is fully opened. Do not climb a closed stepladder. Make sure that both sides of the stepladder  are locked into place. Ask someone to hold it for you, if possible. Clearly mark and make sure that you can see: Any grab bars or handrails. First and last steps. Where the edge of each step is. Use tools that help you move around (mobility aids) if they are needed. These include: Canes. Walkers. Scooters. Crutches. Turn on the lights when you go into a dark area. Replace any light bulbs as soon as they burn out. Set up your furniture so you have a clear path. Avoid moving your furniture around. If any of your floors are uneven, fix them. If there are any pets around you, be aware of where they are. Review your medicines with your doctor. Some medicines can make you feel dizzy. This can increase your chance of falling. Ask your doctor what other things that you can do to help prevent falls. This information is not intended to replace advice given to you by your health care provider. Make sure you discuss any questions you have with your health care provider. Document Released: 09/23/2009 Document Revised: 05/04/2016 Document Reviewed: 01/01/2015 Elsevier Interactive Patient Education  2017 [employer].

## 2022-06-18 ENCOUNTER — Other Ambulatory Visit: Payer: Self-pay | Admitting: Interventional Cardiology

## 2022-07-20 DIAGNOSIS — L821 Other seborrheic keratosis: Secondary | ICD-10-CM | POA: Diagnosis not present

## 2022-07-20 DIAGNOSIS — L570 Actinic keratosis: Secondary | ICD-10-CM | POA: Diagnosis not present

## 2022-07-20 DIAGNOSIS — D225 Melanocytic nevi of trunk: Secondary | ICD-10-CM | POA: Diagnosis not present

## 2022-07-20 DIAGNOSIS — L814 Other melanin hyperpigmentation: Secondary | ICD-10-CM | POA: Diagnosis not present

## 2022-08-16 ENCOUNTER — Other Ambulatory Visit: Payer: Self-pay

## 2022-08-16 ENCOUNTER — Other Ambulatory Visit: Payer: BC Managed Care – PPO

## 2022-08-16 ENCOUNTER — Other Ambulatory Visit (HOSPITAL_COMMUNITY)
Admission: RE | Admit: 2022-08-16 | Discharge: 2022-08-16 | Disposition: A | Discharge status: Home / Self Care | Payer: BC Managed Care – PPO | Source: Ambulatory Visit | Attending: Internal Medicine | Admitting: Internal Medicine

## 2022-08-16 DIAGNOSIS — Z113 Encounter for screening for infections with a predominantly sexual mode of transmission: Secondary | ICD-10-CM

## 2022-08-16 DIAGNOSIS — Z21 Asymptomatic human immunodeficiency virus [HIV] infection status: Secondary | ICD-10-CM | POA: Insufficient documentation

## 2022-08-16 DIAGNOSIS — E7849 Other hyperlipidemia: Secondary | ICD-10-CM

## 2022-08-17 LAB — T-HELPER CELL (CD4) - (RCID CLINIC ONLY)
CD4 % Helper T Cell: 37 % (ref 33–65)
CD4 T Cell Abs: 838 /uL (ref 400–1790)

## 2022-08-17 LAB — URINE CYTOLOGY ANCILLARY ONLY
Chlamydia: NEGATIVE
Comment: NEGATIVE
Comment: NORMAL
Neisseria Gonorrhea: NEGATIVE

## 2022-08-18 LAB — CBC
HCT: 43.1 % (ref 38.5–50.0)
Hemoglobin: 15 g/dL (ref 13.2–17.1)
MCH: 31.2 pg (ref 27.0–33.0)
MCHC: 34.8 g/dL (ref 32.0–36.0)
MCV: 89.6 fL (ref 80.0–100.0)
MPV: 10.2 fL (ref 7.5–12.5)
Platelets: 359 10*3/uL (ref 140–400)
RBC: 4.81 10*6/uL (ref 4.20–5.80)
RDW: 13.2 % (ref 11.0–15.0)
WBC: 7.6 10*3/uL (ref 3.8–10.8)

## 2022-08-18 LAB — COMPLETE METABOLIC PANEL WITH GFR
AG Ratio: 1.2 (calc) (ref 1.0–2.5)
ALT: 20 U/L (ref 9–46)
AST: 17 U/L (ref 10–35)
Albumin: 4 g/dL (ref 3.6–5.1)
Alkaline phosphatase (APISO): 50 U/L (ref 35–144)
BUN: 22 mg/dL (ref 7–25)
CO2: 26 mmol/L (ref 20–32)
Calcium: 9.8 mg/dL (ref 8.6–10.3)
Chloride: 104 mmol/L (ref 98–110)
Creat: 1.26 mg/dL (ref 0.70–1.28)
Globulin: 3.4 g/dL (calc) (ref 1.9–3.7)
Glucose, Bld: 91 mg/dL (ref 65–99)
Potassium: 5 mmol/L (ref 3.5–5.3)
Sodium: 139 mmol/L (ref 135–146)
Total Bilirubin: 0.5 mg/dL (ref 0.2–1.2)
Total Protein: 7.4 g/dL (ref 6.1–8.1)
eGFR: 61 mL/min/{1.73_m2} (ref >=60)

## 2022-08-18 LAB — HIV-1 RNA QUANT-NO REFLEX-BLD
HIV 1 RNA Quant: <20 Copies/mL — ABNORMAL HIGH
HIV-1 RNA Quant, Log: <1.3 Log cps/mL — ABNORMAL HIGH

## 2022-08-18 LAB — LIPID PANEL
Cholesterol: 130 mg/dL (ref <200)
HDL: 39 mg/dL — ABNORMAL LOW (ref >=40)
LDL Cholesterol (Calc): 74 mg/dL (calc)
Non-HDL Cholesterol (Calc): 91 mg/dL (calc) (ref <130)
Total CHOL/HDL Ratio: 3.3 (calc) (ref <5.0)
Triglycerides: 88 mg/dL (ref <150)

## 2022-08-18 LAB — RPR: RPR Ser Ql: NONREACTIVE

## 2022-08-22 ENCOUNTER — Other Ambulatory Visit: Payer: BC Managed Care – PPO

## 2022-08-28 ENCOUNTER — Inpatient Hospital Stay: Payer: BC Managed Care – PPO | Attending: Hematology & Oncology

## 2022-08-28 ENCOUNTER — Other Ambulatory Visit: Payer: Self-pay

## 2022-08-28 ENCOUNTER — Encounter: Payer: Self-pay | Admitting: Hematology & Oncology

## 2022-08-28 ENCOUNTER — Inpatient Hospital Stay (HOSPITAL_BASED_OUTPATIENT_CLINIC_OR_DEPARTMENT_OTHER): Payer: BC Managed Care – PPO | Admitting: Hematology & Oncology

## 2022-08-28 VITALS — BP 123/81 | HR 54 | Temp 98.0°F | Resp 16 | Ht 70.0 in | Wt 157.0 lb

## 2022-08-28 DIAGNOSIS — D472 Monoclonal gammopathy: Secondary | ICD-10-CM

## 2022-08-28 DIAGNOSIS — C880 Waldenstrom macroglobulinemia: Secondary | ICD-10-CM | POA: Diagnosis present

## 2022-08-28 LAB — CBC WITH DIFFERENTIAL (CANCER CENTER ONLY)
Abs Immature Granulocytes: 0.02 10*3/uL (ref 0.00–0.07)
Basophils Absolute: 0 10*3/uL (ref 0.0–0.1)
Basophils Relative: 0 %
Eosinophils Absolute: 0.3 10*3/uL (ref 0.0–0.5)
Eosinophils Relative: 4 %
HCT: 42.5 % (ref 39.0–52.0)
Hemoglobin: 14.4 g/dL (ref 13.0–17.0)
Immature Granulocytes: 0 %
Lymphocytes Relative: 30 %
Lymphs Abs: 2 10*3/uL (ref 0.7–4.0)
MCH: 30.8 pg (ref 26.0–34.0)
MCHC: 33.9 g/dL (ref 30.0–36.0)
MCV: 90.8 fL (ref 80.0–100.0)
Monocytes Absolute: 0.7 10*3/uL (ref 0.1–1.0)
Monocytes Relative: 10 %
Neutro Abs: 3.7 10*3/uL (ref 1.7–7.7)
Neutrophils Relative %: 56 %
Platelet Count: 313 10*3/uL (ref 150–400)
RBC: 4.68 MIL/uL (ref 4.22–5.81)
RDW: 13.6 % (ref 11.5–15.5)
WBC Count: 6.7 10*3/uL (ref 4.0–10.5)
nRBC: 0 % (ref 0.0–0.2)

## 2022-08-28 LAB — CMP (CANCER CENTER ONLY)
ALT: 21 U/L (ref 0–44)
AST: 19 U/L (ref 15–41)
Albumin: 4.3 g/dL (ref 3.5–5.0)
Alkaline Phosphatase: 48 U/L (ref 38–126)
Anion gap: 7 (ref 5–15)
BUN: 23 mg/dL (ref 8–23)
CO2: 29 mmol/L (ref 22–32)
Calcium: 10.2 mg/dL (ref 8.9–10.3)
Chloride: 103 mmol/L (ref 98–111)
Creatinine: 1.33 mg/dL — ABNORMAL HIGH (ref 0.61–1.24)
GFR, Estimated: 57 mL/min — ABNORMAL LOW (ref >60)
Glucose, Bld: 92 mg/dL (ref 70–99)
Potassium: 4.5 mmol/L (ref 3.5–5.1)
Sodium: 139 mmol/L (ref 135–145)
Total Bilirubin: 0.8 mg/dL (ref 0.3–1.2)
Total Protein: 8.1 g/dL (ref 6.5–8.1)

## 2022-08-28 LAB — LACTATE DEHYDROGENASE: LDH: 126 U/L (ref 98–192)

## 2022-08-28 NOTE — Progress Notes (Signed)
Hematology and Oncology Follow Up Visit  JOVE BEYL [id number] 1951-08-27 71 y.o. 08/28/2022   Principle Diagnosis:  IgM smoldering lymphoplasmacytic lymphoma  Current Therapy:   Observation     Interim History:  Mr. Dworkin is back for follow-up.  We last saw back in May.  Since then, has been incredibly busy.  He actually did go out to the [employer].  He did see the [employer].  He had a good time while he was out there.  He might be going to [place] this winter.  He is still busy with work.  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  As far as the the lymphoplasmacytic lymphoma Nils Flack goes, this has been steady.  We last saw him, his monoclonal spike was 0.7 g/dL.  His IgM level was 2300 mg/dL.  The Kappa light chain was 16 mg/dL.  He has not noted any swollen lymph nodes.  He has had no change in bowel or bladder habits.  He has had no bleeding.  He is still running.  He has had no rashes.  There is been no leg swelling.  He has had no fever.  He is doing well on the [place].  Overall, I would say his performance status is probably ECOG 0.   Medications:  Current Outpatient Medications:    albuterol (VENTOLIN HFA) 108 (90 Base) MCG/ACT inhaler, Inhale 1-2 puffs into the lungs every 6 (six) hours as needed for wheezing or shortness of breath., Disp: 1 each, Rfl: 0   aspirin 81 MG chewable tablet, Chew 81 mg by mouth daily., Disp: , Rfl:    atorvastatin (LIPITOR) 40 MG tablet, Take 1 tablet (40 mg total) by mouth daily., Disp: 90 tablet, Rfl: 3   BIKTARVY 50-200-25 MG TABS tablet, TAKE 1 TABLET BY MOUTH DAILY., Disp: 90 tablet, Rfl: 1   losartan (COZAAR) 25 MG tablet, Take 1 tablet (25 mg total) by mouth daily., Disp: 90 tablet, Rfl: 2  Allergies:  Allergies  Allergen Reactions   Cat Hair Extract Shortness Of Breath   Lisinopril Cough    cough    Past Medical History, Surgical history, Social history, and Family History were  reviewed and updated.  Review of Systems: Review of Systems  Constitutional: Negative.   HENT:  Negative.    Eyes: Negative.   Respiratory: Negative.    Cardiovascular: Negative.   Gastrointestinal: Negative.   Endocrine: Negative.   Genitourinary: Negative.    Musculoskeletal: Negative.   Skin: Negative.   Neurological: Negative.   Hematological: Negative.   Psychiatric/Behavioral: Negative.      Physical Exam:  height is '5\' 10"'$  (1.778 m) and weight is 157 lb (71.2 kg). His oral temperature is 98 F (36.7 C). His blood pressure is 123/81 and his pulse is 54 (abnormal). His respiration is 16 and oxygen saturation is 98%.   Wt Readings from Last 3 Encounters:  08/28/22 157 lb (71.2 kg)  04/17/22 162 lb 1.3 oz (73.5 kg)  03/21/22 162 lb 3.2 oz (73.6 kg)    Physical Exam Vitals reviewed.  HENT:     Head: Normocephalic and atraumatic.  Eyes:     Pupils: Pupils are equal, round, and reactive to light.  Cardiovascular:     Rate and Rhythm: Normal rate and regular rhythm.     Heart sounds: Normal heart sounds.  Pulmonary:     Effort: Pulmonary effort is normal.     Breath  sounds: Normal breath sounds.  Abdominal:     General: Bowel sounds are normal.     Palpations: Abdomen is soft.  Musculoskeletal:        General: No tenderness or deformity. Normal range of motion.     Cervical back: Normal range of motion.  Lymphadenopathy:     Cervical: No cervical adenopathy.  Skin:    General: Skin is warm and dry.     Findings: No erythema or rash.  Neurological:     Mental Status: He is alert and oriented to person, place, and time.  Psychiatric:        Behavior: Behavior normal.        Thought Content: Thought content normal.        Judgment: Judgment normal.      Lab Results  Component Value Date   WBC 6.7 08/28/2022   HGB 14.4 08/28/2022   HCT 42.5 08/28/2022   MCV 90.8 08/28/2022   PLT 313 08/28/2022     Chemistry      Component Value Date/Time   NA 139  08/28/2022 0901   K 4.5 08/28/2022 0901   CL 103 08/28/2022 0901   CO2 29 08/28/2022 0901   BUN 23 08/28/2022 0901   CREATININE 1.33 (H) 08/28/2022 0901   CREATININE 1.26 08/16/2022 1011      Component Value Date/Time   CALCIUM 10.2 08/28/2022 0901   ALKPHOS 48 08/28/2022 0901   AST 19 08/28/2022 0901   ALT 21 08/28/2022 0901   BILITOT 0.8 08/28/2022 0901     Impression and Plan: Mr. Hartstein is a very nice 71 year old white male.  He is holding his own.  So far, we have not had to treat this lymphoplasmacytic lymphoma.  He has been asymptomatic with this.  Hopefully, we will see that his protein studies are still looking stable.  I will plan to see him back after the holidays.  I do not see need to do any scans on him right now.   Volanda Napoleon, MD 9/18/20239:47 AM

## 2022-08-29 ENCOUNTER — Encounter: Payer: Self-pay | Admitting: *Deleted

## 2022-08-29 LAB — IGG, IGA, IGM
IgA: 102 mg/dL (ref 61–437)
IgG (Immunoglobin G), Serum: 535 mg/dL — ABNORMAL LOW (ref 603–1613)
IgM (Immunoglobulin M), Srm: 2137 mg/dL — ABNORMAL HIGH (ref 15–143)

## 2022-08-29 LAB — KAPPA/LAMBDA LIGHT CHAINS
Kappa free light chain: 166.9 mg/L — ABNORMAL HIGH (ref 3.3–19.4)
Kappa, lambda light chain ratio: 24.19 — ABNORMAL HIGH (ref 0.26–1.65)
Lambda free light chains: 6.9 mg/L (ref 5.7–26.3)

## 2022-09-01 LAB — PROTEIN ELECTROPHORESIS, SERUM, WITH REFLEX
A/G Ratio: 1.1 (ref 0.7–1.7)
Albumin ELP: 3.8 g/dL (ref 2.9–4.4)
Alpha-1-Globulin: 0.2 g/dL (ref 0.0–0.4)
Alpha-2-Globulin: 0.6 g/dL (ref 0.4–1.0)
Beta Globulin: 1 g/dL (ref 0.7–1.3)
Gamma Globulin: 1.6 g/dL (ref 0.4–1.8)
Globulin, Total: 3.4 g/dL (ref 2.2–3.9)
M-Spike, %: 1.3 g/dL — ABNORMAL HIGH
SPEP Interpretation: 0
Total Protein ELP: 7.2 g/dL (ref 6.0–8.5)

## 2022-09-01 LAB — IMMUNOFIXATION REFLEX, SERUM
IgA: 100 mg/dL (ref 61–437)
IgG (Immunoglobin G), Serum: 558 mg/dL — ABNORMAL LOW (ref 603–1613)
IgM (Immunoglobulin M), Srm: 2600 mg/dL — ABNORMAL HIGH (ref 15–143)

## 2022-09-04 ENCOUNTER — Other Ambulatory Visit: Payer: Self-pay | Admitting: Internal Medicine

## 2022-09-04 DIAGNOSIS — Z21 Asymptomatic human immunodeficiency virus [HIV] infection status: Secondary | ICD-10-CM

## 2022-09-04 NOTE — Telephone Encounter (Signed)
Appointment 9/26 - refills to be provided then.   Beryle Flock, RN

## 2022-09-05 ENCOUNTER — Other Ambulatory Visit: Payer: Self-pay

## 2022-09-05 ENCOUNTER — Encounter: Payer: Self-pay | Admitting: Internal Medicine

## 2022-09-05 ENCOUNTER — Ambulatory Visit (INDEPENDENT_AMBULATORY_CARE_PROVIDER_SITE_OTHER): Payer: BC Managed Care – PPO | Admitting: Internal Medicine

## 2022-09-05 VITALS — BP 129/75 | HR 51 | Temp 97.5°F | Ht 70.0 in | Wt 161.0 lb

## 2022-09-05 DIAGNOSIS — E7849 Other hyperlipidemia: Secondary | ICD-10-CM | POA: Diagnosis not present

## 2022-09-05 DIAGNOSIS — Z5181 Encounter for therapeutic drug level monitoring: Secondary | ICD-10-CM | POA: Diagnosis not present

## 2022-09-05 DIAGNOSIS — Z23 Encounter for immunization: Secondary | ICD-10-CM | POA: Diagnosis not present

## 2022-09-05 DIAGNOSIS — I25708 Atherosclerosis of coronary artery bypass graft(s), unspecified, with other forms of angina pectoris: Secondary | ICD-10-CM

## 2022-09-05 DIAGNOSIS — Z21 Asymptomatic human immunodeficiency virus [HIV] infection status: Secondary | ICD-10-CM

## 2022-09-05 DIAGNOSIS — Z113 Encounter for screening for infections with a predominantly sexual mode of transmission: Secondary | ICD-10-CM

## 2022-09-05 MED ORDER — BIKTARVY 50-200-25 MG PO TABS: 1.0000 | ORAL_TABLET | Freq: Every day | ORAL | 3 refills | Status: DC

## 2022-09-05 NOTE — Assessment & Plan Note (Signed)
Flu shot given today

## 2022-09-05 NOTE — Assessment & Plan Note (Signed)
Screened negative 

## 2022-09-05 NOTE — Progress Notes (Signed)
   Subjective:    Patient ID: Edwin Padilla, male    DOB: 1951/07/28, 72 y.o.   MRN: [id number]  HPI Here for follow up of HIV He continues on Biktarvy and denies any missed doses.  No new issues or concerns.  No issues with getting, taking or tolerating the medication.  Continues to follow with oncology for IgM smoldering lymphoplasmacytic lymphoma and remains on observation.     Review of Systems  Constitutional:  Negative for fatigue.  Gastrointestinal:  Negative for diarrhea.  Skin:  Negative for rash.       Objective:   Physical Exam Eyes:     General: No scleral icterus. Pulmonary:     Effort: Pulmonary effort is normal.  Neurological:     General: No focal deficit present.     Mental Status: He is alert.  Psychiatric:        Mood and Affect: Mood normal.   SH: no tobacco        Assessment & Plan:

## 2022-09-05 NOTE — Assessment & Plan Note (Signed)
He continues to do great on [place].  I reviewed the labs with him and he remains undetectable by definition at <20.  I discussed good suppression despite some detection < 20.

## 2022-09-05 NOTE — Assessment & Plan Note (Signed)
Creat, LFTs ok 

## 2022-09-18 ENCOUNTER — Ambulatory Visit (INDEPENDENT_AMBULATORY_CARE_PROVIDER_SITE_OTHER): Payer: BC Managed Care – PPO | Admitting: Family Medicine

## 2022-09-18 VITALS — BP 120/64 | Ht 70.0 in | Wt 150.0 lb

## 2022-09-18 DIAGNOSIS — M79672 Pain in left foot: Secondary | ICD-10-CM

## 2022-09-18 DIAGNOSIS — I25708 Atherosclerosis of coronary artery bypass graft(s), unspecified, with other forms of angina pectoris: Secondary | ICD-10-CM

## 2022-09-18 NOTE — Progress Notes (Unsigned)
    SUBJECTIVE:   CHIEF COMPLAINT / HPI:   Patient is a 71 year old active male presenting with transient left foot pain. Patient reported while running on the treadmill 5 days ago had a sharp pain on the lateral aspect of his left foot. He was half a mile or 2 minutes into his routine run when he had the sharp pain going from his lateral foot to the top of the 3rd-5th metatarsal bone. He was unable to continue his run due to the pain No issues with weight baring and no swelling or bruises but tender with palpation. Denies any recent trauma to his foot. Pain continued to the next day and by friday night pain spontaneously resolved. Since then he has not had any pain and has been able to run twice, same distance and speed without pain.   PERTINENT  PMH / PSH: Reviewed  OBJECTIVE:   BP 120/64   Ht '5\' 10"'$  (1.778 m)   Wt 150 lb (68 kg)   BMI 21.52 kg/m    Physical Exam General: Alert, well appearing, NAD  Left Foot -No deformity, erythema or edema -Palpable plantar pedal pulse, No pain with palpation -Normal ROM and muscle strength. -No pain with inversion/Eversion of the foot -Negative Thompson test, anterior drawer test    ASSESSMENT/PLAN:   Patient present with transient right foot pain from running likely due to peroneal longus tendor sprain that has since resolved. His exam and history are reassuring. Provided patient with reassurance and encouraged him to continue running at his normal pace and rate. Should this occur again could need imagining to reassess.    Alen Bleacher, MD [place]   {    This will disappear when note is signed, click to select method of visit    :1}

## 2022-09-19 ENCOUNTER — Encounter: Payer: Self-pay | Admitting: Family Medicine

## 2022-09-19 ENCOUNTER — Encounter: Payer: Self-pay | Admitting: Interventional Cardiology

## 2022-09-19 ENCOUNTER — Other Ambulatory Visit: Payer: Self-pay

## 2022-09-19 MED ORDER — ATORVASTATIN CALCIUM 40 MG PO TABS: 40.0000 mg | ORAL_TABLET | Freq: Every day | ORAL | 1 refills | Status: DC

## 2022-09-29 ENCOUNTER — Other Ambulatory Visit: Payer: Self-pay

## 2022-09-29 ENCOUNTER — Ambulatory Visit (INDEPENDENT_AMBULATORY_CARE_PROVIDER_SITE_OTHER): Payer: BC Managed Care – PPO

## 2022-09-29 DIAGNOSIS — Z23 Encounter for immunization: Secondary | ICD-10-CM | POA: Diagnosis not present

## 2022-10-02 DIAGNOSIS — L570 Actinic keratosis: Secondary | ICD-10-CM | POA: Diagnosis not present

## 2022-10-12 ENCOUNTER — Ambulatory Visit (INDEPENDENT_AMBULATORY_CARE_PROVIDER_SITE_OTHER): Payer: BC Managed Care – PPO | Admitting: Family Medicine

## 2022-10-12 ENCOUNTER — Encounter: Payer: Self-pay | Admitting: Family Medicine

## 2022-10-12 VITALS — BP 114/78 | HR 51 | Temp 98.4°F | Wt 158.8 lb

## 2022-10-12 DIAGNOSIS — I25708 Atherosclerosis of coronary artery bypass graft(s), unspecified, with other forms of angina pectoris: Secondary | ICD-10-CM

## 2022-10-12 DIAGNOSIS — J069 Acute upper respiratory infection, unspecified: Secondary | ICD-10-CM | POA: Diagnosis not present

## 2022-10-12 NOTE — Progress Notes (Signed)
OFFICE VISIT  10/12/2022  CC:  Chief Complaint  Patient presents with   Cough    Day 9 today    Patient is a 71 y.o. male who presents for cough.  HPI: Describes onset of nasal congestion/runny nose and cough about 9 days ago. The URI symptoms have resolved for the most part. His cough has continued and is usually dry and worse at night.  He denies wheezing, fever, shortness of breath, chest pain, or fatigue.  No hemoptysis.  No facial pain or sinus pressure.  Most recent infectious disease monitoring labs were September this year were good.  Past Medical History:  Diagnosis Date   Allergy    Cat allergy   Bilateral primary osteoarthritis of knee    Coronary atherosclerosis of unspecified type of vessel, native or graft    CABG 07/2003; annual cardiology f/u (Dr. Pernell Dupre).  Stable as of 02/2017 f/u--echo 03/2017 showed normal LV fxn---ok to d/c ACE-I per cardiologist.   COVID-19 virus infection 05/05/2021   paxlovid   HIV infection ([place]) Dx 02/2002   ID clinic w/ [facility].   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 Marin Olp following.   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   Substance abuse ([place])    hx of    Past Surgical History:  Procedure Laterality Date   CARDIOVASCULAR STRESS TEST  05/2016   No ischemia   COLONOSCOPY  01/2011   Normal (recall 10 yrs)   CORONARY ARTERY BYPASS GRAFT  2004   HERNIA REPAIR     as infant   INGUINAL HERNIA REPAIR Right 04/09/2013   Procedure: RIGHT HERNIA REPAIR INGUINAL ADULT;  Surgeon: Haywood Lasso, MD;  Location: [place];  Service: General;  Laterality: Right;   LYMPH NODE BIOPSY Left 06/16/2021   Procedure: EXCISIONAL BIOPSY LEFT INGUINAL LYMPH NODE;  Surgeon: Coralie Keens, MD;  Location: [place];  Service: General;  Laterality: Left;   TONSILLECTOMY     TRANSESOPHAGEAL ECHOCARDIOGRAM  12/2020   normal (mild av thickening)   TRANSTHORACIC ECHOCARDIOGRAM  03/19/2017   Normal    Outpatient Medications Prior to Visit  Medication Sig Dispense Refill   albuterol (VENTOLIN HFA) 108 (90 Base) MCG/ACT inhaler Inhale 1-2 puffs into the lungs every 6 (six) hours as needed for wheezing or shortness of breath. 1 each 0   aspirin 81 MG chewable tablet Chew 81 mg by mouth daily.     atorvastatin (LIPITOR) 40 MG tablet Take 1 tablet (40 mg total) by mouth daily. 90 tablet 1   bictegravir-emtricitabine-tenofovir AF (BIKTARVY) 50-200-25 MG TABS tablet Take 1 tablet by mouth daily. 90 tablet 3   losartan (COZAAR) 25 MG tablet Take 1 tablet (25 mg total) by mouth daily. 90 tablet 2   No facility-administered medications prior to visit.    Allergies  Allergen Reactions   Cat Hair Extract Shortness Of Breath   Lisinopril Cough    cough    ROS As per HPI  PE:    10/12/2022  3:16 PM 09/18/2022    8:55 AM 09/05/2022    8:34 AM  Vitals with BMI  Height  '5\' 10"'$  '5\' 10"'$   Weight 158 lbs 13 oz 150 lbs 161 lbs  BMI  17.49 44.9  Systolic 675 916 384  Diastolic 78 64 75  Pulse 51  51     Physical Exam  Gen: Alert, well appearing.  Patient is oriented to person, place, time, and situation. AFFECT: pleasant, lucid thought and speech. ENT: Ears: EACs clear, normal epithelium.  TMs with good light reflex and landmarks bilaterally.  Eyes: no injection, icteris, swelling, or exudate.  EOMI, PERRLA. Nose: no drainage or turbinate edema/swelling.  No injection or focal lesion.  Mouth: lips without lesion/swelling.  Oral mucosa pink and moist.  Dentition  intact and without obvious caries or gingival swelling.  Oropharynx without erythema, exudate, or swelling.  NECK: no LAD CV: RRR, no m/r/g.   LUNGS: CTA bilat, nonlabored resps, good aeration in all lung fields.   LABS:  Last CBC Lab Results  Component Value Date   WBC 6.7 08/28/2022   HGB 14.4 08/28/2022   HCT 42.5 08/28/2022   MCV 90.8 08/28/2022   MCH 30.8 08/28/2022   RDW 13.6 08/28/2022   PLT 313 66/59/9357   Last metabolic panel Lab Results  Component Value Date   GLUCOSE 92 08/28/2022   NA 139 08/28/2022   K 4.5 08/28/2022   CL 103 08/28/2022   CO2 29 08/28/2022   BUN 23 08/28/2022   CREATININE 1.33 (H) 08/28/2022   GFRNONAA 57 (L) 08/28/2022   CALCIUM 10.2 08/28/2022   PROT 8.1 08/28/2022   ALBUMIN 4.3 08/28/2022   LABGLOB 3.4 08/28/2022   AGRATIO 1.1 08/28/2022   BILITOT 0.8 08/28/2022   ALKPHOS 48 08/28/2022   AST 19 08/28/2022   ALT 21 08/28/2022   ANIONGAP 7 08/28/2022   IMPRESSION AND PLAN:  #1 viral URI with cough, bronchitis. Reassured.  We will give this 4-5 more days. If not significantly improved at that point then will do a prednisone burst. Signs/symptoms to call or return for were reviewed and pt expressed understanding.  An After Visit Summary was printed and given to the patient.  FOLLOW UP: No follow-ups on file.  Signed:  Crissie Sickles, MD           10/12/2022

## 2022-12-25 ENCOUNTER — Encounter: Payer: Self-pay | Admitting: Hematology & Oncology

## 2022-12-25 ENCOUNTER — Other Ambulatory Visit: Payer: Self-pay

## 2022-12-25 ENCOUNTER — Inpatient Hospital Stay (HOSPITAL_BASED_OUTPATIENT_CLINIC_OR_DEPARTMENT_OTHER): Payer: BC Managed Care – PPO | Admitting: Hematology & Oncology

## 2022-12-25 ENCOUNTER — Inpatient Hospital Stay: Payer: BC Managed Care – PPO | Attending: Hematology & Oncology

## 2022-12-25 VITALS — BP 108/64 | HR 46 | Temp 97.5°F | Resp 18 | Ht 70.0 in | Wt 157.8 lb

## 2022-12-25 DIAGNOSIS — Z7982 Long term (current) use of aspirin: Secondary | ICD-10-CM | POA: Insufficient documentation

## 2022-12-25 DIAGNOSIS — Z79624 Long term (current) use of inhibitors of nucleotide synthesis: Secondary | ICD-10-CM | POA: Diagnosis not present

## 2022-12-25 DIAGNOSIS — Z79899 Other long term (current) drug therapy: Secondary | ICD-10-CM | POA: Diagnosis not present

## 2022-12-25 DIAGNOSIS — R768 Other specified abnormal immunological findings in serum: Secondary | ICD-10-CM | POA: Diagnosis not present

## 2022-12-25 DIAGNOSIS — D472 Monoclonal gammopathy: Secondary | ICD-10-CM | POA: Diagnosis not present

## 2022-12-25 LAB — CBC WITH DIFFERENTIAL (CANCER CENTER ONLY)
Abs Immature Granulocytes: 0.02 10*3/uL (ref 0.00–0.07)
Basophils Absolute: 0 10*3/uL (ref 0.0–0.1)
Basophils Relative: 0 %
Eosinophils Absolute: 0.3 10*3/uL (ref 0.0–0.5)
Eosinophils Relative: 5 %
HCT: 42 % (ref 39.0–52.0)
Hemoglobin: 14.2 g/dL (ref 13.0–17.0)
Immature Granulocytes: 0 %
Lymphocytes Relative: 34 %
Lymphs Abs: 2.1 10*3/uL (ref 0.7–4.0)
MCH: 30.9 pg (ref 26.0–34.0)
MCHC: 33.8 g/dL (ref 30.0–36.0)
MCV: 91.5 fL (ref 80.0–100.0)
Monocytes Absolute: 0.7 10*3/uL (ref 0.1–1.0)
Monocytes Relative: 11 %
Neutro Abs: 3.1 10*3/uL (ref 1.7–7.7)
Neutrophils Relative %: 50 %
Platelet Count: 288 10*3/uL (ref 150–400)
RBC: 4.59 MIL/uL (ref 4.22–5.81)
RDW: 13.9 % (ref 11.5–15.5)
WBC Count: 6.3 10*3/uL (ref 4.0–10.5)
nRBC: 0 % (ref 0.0–0.2)

## 2022-12-25 LAB — CMP (CANCER CENTER ONLY)
ALT: 23 U/L (ref 0–44)
AST: 19 U/L (ref 15–41)
Albumin: 4.2 g/dL (ref 3.5–5.0)
Alkaline Phosphatase: 43 U/L (ref 38–126)
Anion gap: 7 (ref 5–15)
BUN: 24 mg/dL — ABNORMAL HIGH (ref 8–23)
CO2: 28 mmol/L (ref 22–32)
Calcium: 10.2 mg/dL (ref 8.9–10.3)
Chloride: 104 mmol/L (ref 98–111)
Creatinine: 1.32 mg/dL — ABNORMAL HIGH (ref 0.61–1.24)
GFR, Estimated: 58 mL/min — ABNORMAL LOW (ref >60)
Glucose, Bld: 101 mg/dL — ABNORMAL HIGH (ref 70–99)
Potassium: 4.4 mmol/L (ref 3.5–5.1)
Sodium: 139 mmol/L (ref 135–145)
Total Bilirubin: 0.8 mg/dL (ref 0.3–1.2)
Total Protein: 8 g/dL (ref 6.5–8.1)

## 2022-12-25 LAB — SAVE SMEAR(SSMR), FOR PROVIDER SLIDE REVIEW

## 2022-12-25 LAB — LACTATE DEHYDROGENASE: LDH: 118 U/L (ref 98–192)

## 2022-12-25 NOTE — Progress Notes (Signed)
Hematology and Oncology Follow Up Visit  LUIS NICKLES [id number] 1951/07/31 72 y.o. 12/25/2022   Principle Diagnosis:  IgM smoldering lymphoplasmacytic lymphoma  Current Therapy:   Observation     Interim History:  Mr. Maniscalco is back for follow-up.  We last saw him back in September.  Since then, he has been doing quite well.  He is working.  He does travel.  The big trip this year is going to be to the [place].  He will be going in August.  He has had no problems over the Holiday season.  He had no issues with COVID or Influenza.  When we last saw him, his monoclonal studies were up a little bit.  The monoclonal spike was 1.3 g/dL.  His IgM level was about 2500 mg/dL.  The Kappa light chain was 16.8 mg/dL.  Again, has been totally asymptomatic with the elevated IgM.  As such, we are just monitoring this for right now.  He has had no problems with bowels or bladder.  He has had no headache.  There is been no visual changes.  He has had no leg swelling.  There has been no rashes.  He has had good appetite.  He likes to workout.  He keeps in great shape.  Overall, I would have to say that his performance status is probably ECOG 0.   Medications:  Current Outpatient Medications:    albuterol (VENTOLIN HFA) 108 (90 Base) MCG/ACT inhaler, Inhale 1-2 puffs into the lungs every 6 (six) hours as needed for wheezing or shortness of breath., Disp: 1 each, Rfl: 0   aspirin 81 MG chewable tablet, Chew 81 mg by mouth daily., Disp: , Rfl:    atorvastatin (LIPITOR) 40 MG tablet, Take 1 tablet (40 mg total) by mouth daily., Disp: 90 tablet, Rfl: 1   bictegravir-emtricitabine-tenofovir AF (BIKTARVY) 50-200-25 MG TABS tablet, Take 1 tablet by mouth daily., Disp: 90 tablet, Rfl: 3   losartan (COZAAR) 25 MG tablet, Take 1 tablet (25 mg total) by mouth daily., Disp: 90 tablet, Rfl: 2  Allergies:  Allergies  Allergen Reactions   Cat Hair Extract Shortness Of Breath   Lisinopril Cough    cough     Past Medical History, Surgical history, Social history, and Family History were reviewed and updated.  Review of Systems: Review of Systems  Constitutional: Negative.   HENT:  Negative.    Eyes: Negative.   Respiratory: Negative.    Cardiovascular: Negative.   Gastrointestinal: Negative.   Endocrine: Negative.   Genitourinary: Negative.    Musculoskeletal: Negative.   Skin: Negative.   Neurological: Negative.   Hematological: Negative.   Psychiatric/Behavioral: Negative.      Physical Exam:  height is '5\' 10"'$  (1.778 m) and weight is 157 lb 12.8 oz (71.6 kg). His oral temperature is 97.5 F (36.4 C) (abnormal). His blood pressure is 108/64 and his pulse is 46 (abnormal). His respiration is 18 and oxygen saturation is 97%.   Wt Readings from Last 3 Encounters:  12/25/22 157 lb 12.8 oz (71.6 kg)  10/12/22 158 lb 12.8 oz (72 kg)  09/18/22 150 lb (68 kg)    Physical Exam Vitals reviewed.  HENT:     Head: Normocephalic and atraumatic.  Eyes:     Pupils: Pupils are equal, round, and reactive to light.  Cardiovascular:     Rate and Rhythm: Normal rate and regular rhythm.     Heart sounds: Normal heart sounds.  Pulmonary:  Effort: Pulmonary effort is normal.     Breath sounds: Normal breath sounds.  Abdominal:     General: Bowel sounds are normal.     Palpations: Abdomen is soft.  Musculoskeletal:        General: No tenderness or deformity. Normal range of motion.     Cervical back: Normal range of motion.  Lymphadenopathy:     Cervical: No cervical adenopathy.  Skin:    General: Skin is warm and dry.     Findings: No erythema or rash.  Neurological:     Mental Status: He is alert and oriented to person, place, and time.  Psychiatric:        Behavior: Behavior normal.        Thought Content: Thought content normal.        Judgment: Judgment normal.      Lab Results  Component Value Date   WBC 6.3 12/25/2022   HGB 14.2 12/25/2022   HCT 42.0 12/25/2022    MCV 91.5 12/25/2022   PLT 288 12/25/2022     Chemistry      Component Value Date/Time   NA 139 08/28/2022 0901   K 4.5 08/28/2022 0901   CL 103 08/28/2022 0901   CO2 29 08/28/2022 0901   BUN 23 08/28/2022 0901   CREATININE 1.33 (H) 08/28/2022 0901   CREATININE 1.26 08/16/2022 1011      Component Value Date/Time   CALCIUM 10.2 08/28/2022 0901   ALKPHOS 48 08/28/2022 0901   AST 19 08/28/2022 0901   ALT 21 08/28/2022 0901   BILITOT 0.8 08/28/2022 0901     Impression and Plan: Mr. Kuck is a very nice 72 year old white male.  We will have to keep monitoring the IgM levels.  Again, they have been trending upward a little bit.  However, has been very asymptomatic with this.  Our goal is to monitor him and not to interfere with his quality of life.  He is quite busy with work.  I would like to get him back probably in the late Spring.  I want to make sure that everything is going be okay before a heads across the [place] to the [place].   Volanda Napoleon, MD 1/15/20249:40 AM

## 2022-12-26 ENCOUNTER — Encounter: Payer: Self-pay | Admitting: Family Medicine

## 2022-12-26 DIAGNOSIS — Z1211 Encounter for screening for malignant neoplasm of colon: Secondary | ICD-10-CM

## 2022-12-26 LAB — IGG, IGA, IGM
IgA: 88 mg/dL (ref 61–437)
IgG (Immunoglobin G), Serum: 564 mg/dL — ABNORMAL LOW (ref 603–1613)
IgM (Immunoglobulin M), Srm: 2361 mg/dL — ABNORMAL HIGH (ref 15–143)

## 2022-12-26 LAB — KAPPA/LAMBDA LIGHT CHAINS
Kappa free light chain: 176.6 mg/L — ABNORMAL HIGH (ref 3.3–19.4)
Kappa, lambda light chain ratio: 33.32 — ABNORMAL HIGH (ref 0.26–1.65)
Lambda free light chains: 5.3 mg/L — ABNORMAL LOW (ref 5.7–26.3)

## 2023-01-01 ENCOUNTER — Encounter: Payer: Self-pay | Admitting: Gastroenterology

## 2023-01-01 LAB — PROTEIN ELECTROPHORESIS, SERUM, WITH REFLEX
A/G Ratio: 1.1 (ref 0.7–1.7)
Albumin ELP: 4.1 g/dL (ref 2.9–4.4)
Alpha-1-Globulin: 0.2 g/dL (ref 0.0–0.4)
Alpha-2-Globulin: 0.6 g/dL (ref 0.4–1.0)
Beta Globulin: 0.7 g/dL (ref 0.7–1.3)
Gamma Globulin: 2 g/dL — ABNORMAL HIGH (ref 0.4–1.8)
Globulin, Total: 3.6 g/dL (ref 2.2–3.9)
M-Spike, %: 1.4 g/dL — ABNORMAL HIGH
SPEP Interpretation: 0
Total Protein ELP: 7.7 g/dL (ref 6.0–8.5)

## 2023-01-01 LAB — IMMUNOFIXATION REFLEX, SERUM
IgA: 86 mg/dL (ref 61–437)
IgG (Immunoglobin G), Serum: 608 mg/dL (ref 603–1613)
IgM (Immunoglobulin M), Srm: 2231 mg/dL — ABNORMAL HIGH (ref 15–143)

## 2023-02-12 ENCOUNTER — Ambulatory Visit (AMBULATORY_SURGERY_CENTER): Payer: BC Managed Care – PPO

## 2023-02-12 VITALS — Ht 70.0 in | Wt 152.0 lb

## 2023-02-12 DIAGNOSIS — Z1211 Encounter for screening for malignant neoplasm of colon: Secondary | ICD-10-CM

## 2023-02-12 MED ORDER — PEG 3350-KCL-NA BICARB-NACL 420 G PO SOLR: 4000.0000 mL | Freq: Once | ORAL | 0 refills | Status: AC

## 2023-02-12 NOTE — Progress Notes (Signed)
No egg or soy allergy known to patient   No issues known to pt with past sedation with any surgeries or procedures  Patient denies ever being told they had issues or difficulty with intubation   No FH of Malignant Hyperthermia  Pt is not on diet pills  Pt is not on  home 02   Pt is not on blood thinners   Pt denies issues with constipation   No A fib or A flutter  Have any cardiac testing pending--no Pt instructed to use Singlecare.com or GoodRx for a price reduction on prep   

## 2023-03-05 ENCOUNTER — Encounter: Payer: BC Managed Care – PPO | Admitting: Gastroenterology

## 2023-03-13 NOTE — Progress Notes (Signed)
  Cardiology Office Note:   Date:  03/16/2023  ID:  Edwin Padilla, DOB May 02, 1951, MRN [id number]  History of Present Illness:   Edwin Padilla is a 72 y.o. male with history of CAD with prior CABG (LIMA-LAD, SVG-D1) in 2004, HLD, lymphoma and HIV who was previously followed by Dr. Katrinka Blazing who now presents to clinic for follow-up.  Patient was last seen by Dr. Katrinka Blazing in 03/2022 where he was recently diagnosed with IgM smoldering lymphoplasmacytic lymphoma. Was otherwise doing well from a CV standpoint.  Today, the patient overall feels well. No chest pain, SOB, orthopnea or LE edema. No palpitations. Remains active and he runs 3-4x/week and feels well with activity.   ROS: As per HPI  Studies Reviewed:    EKG:  SB with HR 47  TTE 12/2020:   IMPRESSIONS    1. Left ventricular ejection fraction, by estimation, is 55 to 60%. The left ventricle has normal function. The left ventricle has no regional wall motion abnormalities. Left ventricular diastolic parameters were normal.  2. Right ventricular systolic function is normal. The right ventricular size is normal. There is normal pulmonary artery systolic pressure. The estimated right ventricular systolic pressure is 22.4 mmHg.  3. Right atrial size was mildly dilated.  4. The mitral valve is normal in structure. No evidence of mitral valve regurgitation. No evidence of mitral stenosis.  5. The aortic valve is tricuspid. There is mild thickening of the aortic valve. Aortic valve regurgitation is not visualized. No aortic stenosis is present.  6. The inferior vena cava is normal in size with greater than 50% respiratory variability, suggesting right atrial pressure of 3 mmHg.     Risk Assessment/Calculations:              Physical Exam:   VS:  BP 108/66   Pulse (!) 47   Ht 5\' 10"  (1.778 m)   Wt 158 lb (71.7 kg)   SpO2 95%   BMI 22.67 kg/m    Wt Readings from Last 3 Encounters:  03/16/23 158 lb (71.7 kg)  02/12/23 152 lb  (68.9 kg)  12/25/22 157 lb 12.8 oz (71.6 kg)     GEN: Well nourished, well developed in no acute distress NECK: No JVD; No carotid bruits CARDIAC: Bradycardiac, regular RESPIRATORY:  Clear to auscultation without rales, wheezing or rhonchi  ABDOMEN: Soft, non-tender, non-distended EXTREMITIES:  No edema; No deformity   ASSESSMENT AND PLAN:   #CAD s/p CABG: -Doing well without anginal symptoms -Continue ASA 81mg  daily -Continue lipitor 40mg  daily -Continue losartan 25mg  daily  #HLD: -Continue lipitor 40mg  daily -LDL was 74 08/2022 -Lipids monitored by PCP  #HTN: -Continue losartan 25mg  daily -Blood pressure is very well controlled <130/90  #IgM smoldering lymphoplasmacytic lymphoma: -Follows with Dr. Myna Hidalgo         Signed, Meriam Sprague, MD

## 2023-03-16 ENCOUNTER — Ambulatory Visit: Payer: BC Managed Care – PPO | Attending: Cardiology | Admitting: Cardiology

## 2023-03-16 ENCOUNTER — Encounter: Payer: Self-pay | Admitting: Cardiology

## 2023-03-16 VITALS — BP 108/66 | HR 47 | Ht 70.0 in | Wt 158.0 lb

## 2023-03-16 DIAGNOSIS — E7849 Other hyperlipidemia: Secondary | ICD-10-CM | POA: Diagnosis not present

## 2023-03-16 DIAGNOSIS — I25708 Atherosclerosis of coronary artery bypass graft(s), unspecified, with other forms of angina pectoris: Secondary | ICD-10-CM

## 2023-03-16 NOTE — Patient Instructions (Signed)
Medication Instructions:   Your physician recommends that you continue on your current medications as directed. Please refer to the Current Medication list given to you today.  *If you need a refill on your cardiac medications before your next appointment, please call your pharmacy*   Follow-Up: At [facility], you and your health needs are our priority.  As part of our continuing mission to provide you with exceptional heart care, we have created designated Provider Care Teams.  These Care Teams include your primary Cardiologist (physician) and Advanced Practice Providers (APPs -  Physician Assistants and Nurse Practitioners) who all work together to provide you with the care you need, when you need it.  We recommend signing up for the patient portal called "MyChart".  Sign up information is provided on this After Visit Summary.  MyChart is used to connect with patients for Virtual Visits (Telemedicine).  Patients are able to view lab/test results, encounter notes, upcoming appointments, etc.  Non-urgent messages can be sent to your provider as well.   To learn more about what you can do with MyChart, go to [email or web address].    Your next appointment:   1 year(s)  Provider:   DR. PEMBERTON   

## 2023-03-26 ENCOUNTER — Encounter: Payer: Self-pay | Admitting: Cardiology

## 2023-03-26 MED ORDER — LOSARTAN POTASSIUM 25 MG PO TABS: 25.0000 mg | ORAL_TABLET | Freq: Every day | ORAL | 3 refills | Status: DC

## 2023-03-26 MED ORDER — ATORVASTATIN CALCIUM 40 MG PO TABS: 40.0000 mg | ORAL_TABLET | Freq: Every day | ORAL | 3 refills | Status: DC

## 2023-04-03 DIAGNOSIS — L821 Other seborrheic keratosis: Secondary | ICD-10-CM | POA: Diagnosis not present

## 2023-04-03 DIAGNOSIS — D225 Melanocytic nevi of trunk: Secondary | ICD-10-CM | POA: Diagnosis not present

## 2023-04-03 DIAGNOSIS — L814 Other melanin hyperpigmentation: Secondary | ICD-10-CM | POA: Diagnosis not present

## 2023-04-10 ENCOUNTER — Encounter: Payer: BC Managed Care – PPO | Admitting: Gastroenterology

## 2023-05-14 ENCOUNTER — Inpatient Hospital Stay (HOSPITAL_BASED_OUTPATIENT_CLINIC_OR_DEPARTMENT_OTHER): Payer: BC Managed Care – PPO | Admitting: Hematology & Oncology

## 2023-05-14 ENCOUNTER — Encounter: Payer: Self-pay | Admitting: Hematology & Oncology

## 2023-05-14 ENCOUNTER — Inpatient Hospital Stay: Payer: BC Managed Care – PPO | Attending: Hematology & Oncology

## 2023-05-14 VITALS — BP 109/86 | HR 45 | Temp 98.0°F | Resp 17 | Wt 156.0 lb

## 2023-05-14 DIAGNOSIS — Z79899 Other long term (current) drug therapy: Secondary | ICD-10-CM | POA: Insufficient documentation

## 2023-05-14 DIAGNOSIS — D472 Monoclonal gammopathy: Secondary | ICD-10-CM | POA: Diagnosis not present

## 2023-05-14 DIAGNOSIS — C8300 Small cell B-cell lymphoma, unspecified site: Secondary | ICD-10-CM | POA: Diagnosis present

## 2023-05-14 DIAGNOSIS — Z7982 Long term (current) use of aspirin: Secondary | ICD-10-CM | POA: Diagnosis not present

## 2023-05-14 LAB — CBC WITH DIFFERENTIAL (CANCER CENTER ONLY)
Abs Immature Granulocytes: 0.02 10*3/uL (ref 0.00–0.07)
Basophils Absolute: 0 10*3/uL (ref 0.0–0.1)
Basophils Relative: 1 %
Eosinophils Absolute: 0.3 10*3/uL (ref 0.0–0.5)
Eosinophils Relative: 4 %
HCT: 41 % (ref 39.0–52.0)
Hemoglobin: 14.1 g/dL (ref 13.0–17.0)
Immature Granulocytes: 0 %
Lymphocytes Relative: 32 %
Lymphs Abs: 2 10*3/uL (ref 0.7–4.0)
MCH: 31.2 pg (ref 26.0–34.0)
MCHC: 34.4 g/dL (ref 30.0–36.0)
MCV: 90.7 fL (ref 80.0–100.0)
Monocytes Absolute: 0.7 10*3/uL (ref 0.1–1.0)
Monocytes Relative: 11 %
Neutro Abs: 3.3 10*3/uL (ref 1.7–7.7)
Neutrophils Relative %: 52 %
Platelet Count: 255 10*3/uL (ref 150–400)
RBC: 4.52 MIL/uL (ref 4.22–5.81)
RDW: 13.8 % (ref 11.5–15.5)
WBC Count: 6.3 10*3/uL (ref 4.0–10.5)
nRBC: 0 % (ref 0.0–0.2)

## 2023-05-14 LAB — CMP (CANCER CENTER ONLY)
ALT: 24 U/L (ref 0–44)
AST: 20 U/L (ref 15–41)
Albumin: 4.2 g/dL (ref 3.5–5.0)
Alkaline Phosphatase: 45 U/L (ref 38–126)
Anion gap: 6 (ref 5–15)
BUN: 25 mg/dL — ABNORMAL HIGH (ref 8–23)
CO2: 27 mmol/L (ref 22–32)
Calcium: 9.9 mg/dL (ref 8.9–10.3)
Chloride: 105 mmol/L (ref 98–111)
Creatinine: 1.25 mg/dL — ABNORMAL HIGH (ref 0.61–1.24)
GFR, Estimated: >60 mL/min (ref >60)
Glucose, Bld: 90 mg/dL (ref 70–99)
Potassium: 4.1 mmol/L (ref 3.5–5.1)
Sodium: 138 mmol/L (ref 135–145)
Total Bilirubin: 0.7 mg/dL (ref 0.3–1.2)
Total Protein: 7.6 g/dL (ref 6.5–8.1)

## 2023-05-14 LAB — LACTATE DEHYDROGENASE: LDH: 124 U/L (ref 98–192)

## 2023-05-14 NOTE — Progress Notes (Signed)
Hematology and Oncology Follow Up Visit  Edwin Padilla [id number] 1951-07-30 72 y.o. 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  We last saw him back in January.  Since then, he has been doing pretty well.  He is going to be going to [place] in August.  Sounds like he will be there for about 10 days.  I know that he will have a wonderful time.  He will be gone up to the [facility] I think either this month or next month.  I know that he will enjoy this.  He is a big [employer].  I am sure that he will catch some games at [facility].  As far as his monoclonal gammopathy is concerned, everything is still holding steady.  When we last saw him, his monoclonal spike was 1.4 g/dL.  The IgM level was 2200 mg/dL.  The Kappa light chain was 17.7 mg/dL.  He is still working.  He works quite a bit.  He has had no issues with respect to lymph nodes.  He has had no change in bowel or bladder habits.  He has had no nausea or vomiting.  He had no cough or shortness of breath.  He has had no bleeding.  Overall, I would say that his performance status is probably ECOG 1.   Medications:  Current Outpatient Medications:    albuterol (VENTOLIN HFA) 108 (90 Base) MCG/ACT inhaler, Inhale 1-2 puffs into the lungs every 6 (six) hours as needed for wheezing or shortness of breath., Disp: 1 each, Rfl: 0   aspirin 81 MG chewable tablet, Chew 81 mg by mouth daily., Disp: , Rfl:    atorvastatin (LIPITOR) 40 MG tablet, Take 1 tablet (40 mg total) by mouth daily., Disp: 90 tablet, Rfl: 3   bictegravir-emtricitabine-tenofovir AF (BIKTARVY) 50-200-25 MG TABS tablet, Take 1 tablet by mouth daily., Disp: 90 tablet, Rfl: 3   losartan (COZAAR) 25 MG tablet, Take 1 tablet (25 mg total) by mouth daily., Disp: 90 tablet, Rfl: 3  Allergies:  Allergies  Allergen Reactions   Cat Hair Extract  Shortness Of Breath   Lisinopril Cough    cough    Past Medical History, Surgical history, Social history, and Family History were reviewed and updated.  Review of Systems: Review of Systems  Constitutional: Negative.   HENT:  Negative.    Eyes: Negative.   Respiratory: Negative.    Cardiovascular: Negative.   Gastrointestinal: Negative.   Endocrine: Negative.   Genitourinary: Negative.    Musculoskeletal: Negative.   Skin: Negative.   Neurological: Negative.   Hematological: Negative.   Psychiatric/Behavioral: Negative.      Physical Exam:  weight is 156 lb (70.8 kg). His oral temperature is 98 F (36.7 C). His blood pressure is 109/86 and his pulse is 45 (abnormal). His respiration is 17 and oxygen saturation is 99%.   Wt Readings from Last 3 Encounters:  05/14/23 156 lb (70.8 kg)  03/16/23 158 lb (71.7 kg)  02/12/23 152 lb (68.9 kg)    Physical Exam Vitals reviewed.  HENT:     Head: Normocephalic and atraumatic.  Eyes:     Pupils: Pupils are equal, round, and reactive to light.  Cardiovascular:     Rate and Rhythm: Normal rate and regular rhythm.     Heart sounds: Normal heart sounds.  Pulmonary:  Effort: Pulmonary effort is normal.     Breath sounds: Normal breath sounds.  Abdominal:     General: Bowel sounds are normal.     Palpations: Abdomen is soft.  Musculoskeletal:        General: No tenderness or deformity. Normal range of motion.     Cervical back: Normal range of motion.  Lymphadenopathy:     Cervical: No cervical adenopathy.  Skin:    General: Skin is warm and dry.     Findings: No erythema or rash.  Neurological:     Mental Status: He is alert and oriented to person, place, and time.  Psychiatric:        Behavior: Behavior normal.        Thought Content: Thought content normal.        Judgment: Judgment normal.     Lab Results  Component Value Date   WBC 6.3 05/14/2023   HGB 14.1 05/14/2023   HCT 41.0 05/14/2023   MCV 90.7  05/14/2023   PLT 255 05/14/2023     Chemistry      Component Value Date/Time   NA 139 12/25/2022 0922   K 4.4 12/25/2022 0922   CL 104 12/25/2022 0922   CO2 28 12/25/2022 0922   BUN 24 (H) 12/25/2022 0922   CREATININE 1.32 (H) 12/25/2022 0922   CREATININE 1.26 08/16/2022 1011      Component Value Date/Time   CALCIUM 10.2 12/25/2022 0922   ALKPHOS 43 12/25/2022 0922   AST 19 12/25/2022 0922   ALT 23 12/25/2022 0922   BILITOT 0.8 12/25/2022 0922     Impression and Plan: [name] is a very nice 72 year old white male.  He has a lymphoplasmacytic lymphoma.  Everything is been holding relatively stable for him.  I would think that his monoclonal studies should be stable.  His total protein is not on the high side.  I know that he will have a wonderful time over in [place].  I told to make sure that he stays well-hydrated.  We will plan to see him back in September.    Edwin Macho, MD 6/3/20249:20 AM

## 2023-05-15 LAB — KAPPA/LAMBDA LIGHT CHAINS
Kappa free light chain: 194.9 mg/L — ABNORMAL HIGH (ref 3.3–19.4)
Kappa, lambda light chain ratio: 27.45 — ABNORMAL HIGH (ref 0.26–1.65)
Lambda free light chains: 7.1 mg/L (ref 5.7–26.3)

## 2023-05-16 LAB — IGG, IGA, IGM
IgA: 69 mg/dL (ref 61–437)
IgG (Immunoglobin G), Serum: 525 mg/dL — ABNORMAL LOW (ref 603–1613)
IgM (Immunoglobulin M), Srm: 2090 mg/dL — ABNORMAL HIGH (ref 15–143)

## 2023-05-23 LAB — PROTEIN ELECTROPHORESIS, SERUM, WITH REFLEX
A/G Ratio: 1 (ref 0.7–1.7)
Albumin ELP: 3.6 g/dL (ref 2.9–4.4)
Alpha-1-Globulin: 0.2 g/dL (ref 0.0–0.4)
Alpha-2-Globulin: 0.6 g/dL (ref 0.4–1.0)
Beta Globulin: 0.7 g/dL (ref 0.7–1.3)
Gamma Globulin: 1.9 g/dL — ABNORMAL HIGH (ref 0.4–1.8)
Globulin, Total: 3.5 g/dL (ref 2.2–3.9)
M-Spike, %: 1.3 g/dL — ABNORMAL HIGH
SPEP Interpretation: 0
Total Protein ELP: 7.1 g/dL (ref 6.0–8.5)

## 2023-05-23 LAB — IMMUNOFIXATION REFLEX, SERUM
IgA: 68 mg/dL (ref 61–437)
IgG (Immunoglobin G), Serum: 526 mg/dL — ABNORMAL LOW (ref 603–1613)
IgM (Immunoglobulin M), Srm: 2089 mg/dL — ABNORMAL HIGH (ref 15–143)

## 2023-06-13 ENCOUNTER — Ambulatory Visit (INDEPENDENT_AMBULATORY_CARE_PROVIDER_SITE_OTHER): Payer: BC Managed Care – PPO

## 2023-06-13 VITALS — Ht 70.0 in | Wt 150.0 lb

## 2023-06-13 DIAGNOSIS — Z0000 Encounter for general adult medical examination without abnormal findings: Secondary | ICD-10-CM

## 2023-06-13 NOTE — Patient Instructions (Signed)
Edwin Padilla , Thank you for taking time to come for your Medicare Wellness Visit. I appreciate your ongoing commitment to your health goals. Please review the following plan we discussed and let me know if I can assist you in the future.   Ask Dr Milinda Cave about Low Dose Chest CT Lung Cancer screening. Get your tetanus vaccine as it has been over 10 years.   These are the goals we discussed:  Goals      Patient Stated     Maintain current health     Patient Stated     Lose a couple lbs & resolve emotional challenges         This is a list of the screening recommended for you and due dates:  Health Maintenance  Topic Date Due   Zoster (Shingles) Vaccine (2 of 2) 11/11/2017   DTaP/Tdap/Td vaccine (2 - Td or Tdap) 09/07/2020   Colon Cancer Screening  01/19/2021   COVID-19 Vaccine (8 - 2023-24 season) 11/24/2022   Flu Shot  07/12/2023   Medicare Annual Wellness Visit  06/12/2024   Pneumonia Vaccine  Completed   Hepatitis C Screening  Completed   HPV Vaccine  Aged Out    Advanced directives: Please bring a copy of your health care power of attorney and living will to the office to be added to your chart at your convenience.   Conditions/risks identified: Aim for 30 minutes of exercise or brisk walking, 6-8 glasses of water, and 5 servings of fruits and vegetables each day.   Next appointment: Follow up in one year for your annual wellness visit.   Preventive Care 26 Years and Older, Male  Preventive care refers to lifestyle choices and visits with your health care provider that can promote health and wellness. What does preventive care include? A yearly physical exam. This is also called an annual well check. Dental exams once or twice a year. Routine eye exams. Ask your health care provider how often you should have your eyes checked. Personal lifestyle choices, including: Daily care of your teeth and gums. Regular physical activity. Eating a healthy diet. Avoiding tobacco and  drug use. Limiting alcohol use. Practicing safe sex. Taking low doses of aspirin every day. Taking vitamin and mineral supplements as recommended by your health care provider. What happens during an annual well check? The services and screenings done by your health care provider during your annual well check will depend on your age, overall health, lifestyle risk factors, and family history of disease. Counseling  Your health care provider may ask you questions about your: Alcohol use. Tobacco use. Drug use. Emotional well-being. Home and relationship well-being. Sexual activity. Eating habits. History of falls. Memory and ability to understand (cognition). Work and work Astron[employer]. Screening  You may have the following tests or measurements: Height, weight, and BMI. Blood pressure. Lipid and cholesterol levels. These may be checked every 5 years, or more frequently if you are over 68 years old. Skin check. Lung cancer screening. You may have this screening every year starting at age 96 if you have a 30-pack-year history of smoking and currently smoke or have quit within the past 15 years. Fecal occult blood test (FOBT) of the stool. You may have this test every year starting at age 24. Flexible sigmoidoscopy or colonoscopy. You may have a sigmoidoscopy every 5 years or a colonoscopy every 10 years starting at age 48. Prostate cancer screening. Recommendations will vary depending on your family history and other risks.  Hepatitis C blood test. Hepatitis B blood test. Sexually transmitted disease (STD) testing. Diabetes screening. This is done by checking your blood sugar (glucose) after you have not eaten for a while (fasting). You may have this done every 1-3 years. Abdominal aortic aneurysm (AAA) screening. You may need this if you are a current or former smoker. Osteoporosis. You may be screened starting at age 56 if you are at high risk. Talk with your health care provider  about your test results, treatment options, and if necessary, the need for more tests. Vaccines  Your health care provider may recommend certain vaccines, such as: Influenza vaccine. This is recommended every year. Tetanus, diphtheria, and acellular pertussis (Tdap, Td) vaccine. You may need a Td booster every 10 years. Zoster vaccine. You may need this after age 40. Pneumococcal 13-valent conjugate (PCV13) vaccine. One dose is recommended after age 89. Pneumococcal polysaccharide (PPSV23) vaccine. One dose is recommended after age 69. Talk to your health care provider about which screenings and vaccines you need and how often you need them. This information is not intended to replace advice given to you by your health care provider. Make sure you discuss any questions you have with your health care provider. Document Released: 12/24/2015 Document Revised: 08/16/2016 Document Reviewed: 09/28/2015 Elsevier Interactive Patient Education  2017 [place] Prevention in the Home Falls can cause injuries. They can happen to people of all ages. There are many things you can do to make your home safe and to help prevent falls. What can I do on the outside of my home? Regularly fix the edges of walkways and driveways and fix any cracks. Remove anything that might make you trip as you walk through a door, such as a raised step or threshold. Trim any bushes or trees on the path to your home. Use bright outdoor lighting. Clear any walking paths of anything that might make someone trip, such as rocks or tools. Regularly check to see if handrails are loose or broken. Make sure that both sides of any steps have handrails. Any raised decks and porches should have guardrails on the edges. Have any leaves, snow, or ice cleared regularly. Use sand or salt on walking paths during winter. Clean up any spills in your garage right away. This includes oil or grease spills. What can I do in the  bathroom? Use night lights. Install grab bars by the toilet and in the tub and shower. Do not use towel bars as grab bars. Use non-skid mats or decals in the tub or shower. If you need to sit down in the shower, use a plastic, non-slip stool. Keep the floor dry. Clean up any water that spills on the floor as soon as it happens. Remove soap buildup in the tub or shower regularly. Attach bath mats securely with double-sided non-slip rug tape. Do not have throw rugs and other things on the floor that can make you trip. What can I do in the bedroom? Use night lights. Make sure that you have a light by your bed that is easy to reach. Do not use any sheets or blankets that are too big for your bed. They should not hang down onto the floor. Have a firm chair that has side arms. You can use this for support while you get dressed. Do not have throw rugs and other things on the floor that can make you trip. What can I do in the kitchen? Clean up any spills right away. Avoid  walking on wet floors. Keep items that you use a lot in easy-to-reach places. If you need to reach something above you, use a strong step stool that has a grab bar. Keep electrical cords out of the way. Do not use floor polish or wax that makes floors slippery. If you must use wax, use non-skid floor wax. Do not have throw rugs and other things on the floor that can make you trip. What can I do with my stairs? Do not leave any items on the stairs. Make sure that there are handrails on both sides of the stairs and use them. Fix handrails that are broken or loose. Make sure that handrails are as long as the stairways. Check any carpeting to make sure that it is firmly attached to the stairs. Fix any carpet that is loose or worn. Avoid having throw rugs at the top or bottom of the stairs. If you do have throw rugs, attach them to the floor with carpet tape. Make sure that you have a light switch at the top of the stairs and the  bottom of the stairs. If you do not have them, ask someone to add them for you. What else can I do to help prevent falls? Wear shoes that: Do not have high heels. Have rubber bottoms. Are comfortable and fit you well. Are closed at the toe. Do not wear sandals. If you use a stepladder: Make sure that it is fully opened. Do not climb a closed stepladder. Make sure that both sides of the stepladder are locked into place. Ask someone to hold it for you, if possible. Clearly mark and make sure that you can see: Any grab bars or handrails. First and last steps. Where the edge of each step is. Use tools that help you move around (mobility aids) if they are needed. These include: Canes. Walkers. Scooters. Crutches. Turn on the lights when you go into a dark area. Replace any light bulbs as soon as they burn out. Set up your furniture so you have a clear path. Avoid moving your furniture around. If any of your floors are uneven, fix them. If there are any pets around you, be aware of where they are. Review your medicines with your doctor. Some medicines can make you feel dizzy. This can increase your chance of falling. Ask your doctor what other things that you can do to help prevent falls. This information is not intended to replace advice given to you by your health care provider. Make sure you discuss any questions you have with your health care provider. Document Released: 09/23/2009 Document Revised: 05/04/2016 Document Reviewed: 01/01/2015 Elsevier Interactive Patient Education  2017 [employer].

## 2023-06-13 NOTE — Progress Notes (Signed)
Subjective:   Edwin Padilla is a 72 y.o. male who presents for Medicare Annual/Subsequent preventive examination.  Visit Complete: Virtual  I connected with  [name] on 06/13/23 by a audio enabled telemedicine application and verified that I am speaking with the correct person using two identifiers.  Patient Location: Home  Provider Location: Home Office  I discussed the limitations of evaluation and management by telemedicine. The patient expressed understanding and agreed to proceed.   Review of Systems     Cardiac Risk Factors include: advanced age (>31men, >54 women);dyslipidemia;male gender;Other (see comment), Risk factor comments: HIV, CAD     Objective:    Today's Vitals   06/13/23 0925  Weight: 150 lb (68 kg)  Height: 5\' 10"  (1.778 m)   Body mass index is 21.52 kg/m.     06/13/2023    9:35 AM 05/14/2023    8:57 AM 12/25/2022    9:36 AM 08/28/2022    9:25 AM 05/31/2022   10:53 AM 04/17/2022    9:39 AM 12/19/2021    9:25 AM  Advanced Directives  Does Patient Have a Medical Advance Directive? Yes Yes Yes Yes Yes Yes Yes  Type of Estate agent of [place];Living will Healthcare Power of [place];Living will Healthcare Power of [place];Living will Living will;Healthcare Power of [employer] Power of [place];Living will Living will Living will  Does patient want to make changes to medical advance directive?   No - Patient declined No - Patient declined   No - Patient declined  Copy of Healthcare Power of Attorney in Chart? No - copy requested No - copy requested No - copy requested  No - copy requested      Current Medications (verified) Outpatient Encounter Medications as of 06/13/2023  Medication Sig   albuterol (VENTOLIN HFA) 108 (90 Base) MCG/ACT inhaler Inhale 1-2 puffs into the lungs every 6 (six) hours as needed for wheezing or shortness of breath.   aspirin 81 MG chewable tablet Chew 81 mg by mouth daily.   atorvastatin (LIPITOR)  40 MG tablet Take 1 tablet (40 mg total) by mouth daily.   bictegravir-emtricitabine-tenofovir AF (BIKTARVY) 50-200-25 MG TABS tablet Take 1 tablet by mouth daily.   losartan (COZAAR) 25 MG tablet Take 1 tablet (25 mg total) by mouth daily.   No facility-administered encounter medications on file as of 06/13/2023.    Allergies (verified) Cat hair extract and Lisinopril   History: Past Medical History:  Diagnosis Date   Allergy    Cat allergy   Bilateral primary osteoarthritis of knee    Coronary atherosclerosis of unspecified type of vessel, native or graft    CABG 07/2003; annual cardiology f/u (Dr. Garnette Scheuermann).  Stable as of 02/2017 f/u--echo 03/2017 showed normal LV fxn---ok to d/c ACE-I per cardiologist.   COVID-19 virus infection 05/05/2021   paxlovid   HIV infection (HCC) Dx 02/2002   ID clinic w/ [facility].   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 Myna Hidalgo following.   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   Substance abuse (HCC)    hx of   Past Surgical History:  Procedure Laterality Date   biopsy lymph node groin  2022   BYPASS GRAFT  2004   CARDIOVASCULAR STRESS TEST  05/2016   No ischemia   COLONOSCOPY  01/2011   Normal (recall 10 yrs)   CORONARY ARTERY BYPASS GRAFT  2004   HERNIA REPAIR     as infant   INGUINAL HERNIA REPAIR Right 04/09/2013   Procedure: RIGHT HERNIA REPAIR INGUINAL ADULT;  Surgeon: Currie Paris, MD;  Location: [facility];  Service: General;   Laterality: Right;   LYMPH NODE BIOPSY Left 06/16/2021   Procedure: EXCISIONAL BIOPSY LEFT INGUINAL LYMPH NODE;  Surgeon: Abigail Miyamoto, MD;  Location:  SURGERY CENTER;  Service: General;  Laterality: Left;   TONSILLECTOMY     TRANSESOPHAGEAL ECHOCARDIOGRAM  12/2020   normal (mild av thickening)   TRANSTHORACIC ECHOCARDIOGRAM  03/19/2017   Normal   Family History  Problem Relation Age of Onset   Stroke Mother 54   Heart failure Father    Heart disease Father    Colon cancer Father 43   Cancer Brother 27       lymphoma   Colon polyps Neg Hx    Esophageal cancer Neg Hx    Rectal cancer Neg Hx    Stomach cancer Neg Hx    Social History   Socioeconomic History   Marital status: Single    Spouse name: Not on file   Number of children: Not on file   Years of education: Not on file   Highest education level: Not on file  Occupational History   Not on file  Tobacco Use   Smoking status: Former    Years: 35    Types: Cigarettes    Quit date: 12/12/2003    Years since quitting: 19.5   Smokeless tobacco: Never  Vaping Use   Vaping Use: Never used  Substance and Sexual Activity   Alcohol use: Never    Comment: 2005   Drug use: No    Comment: past history of crystal meth addiction -clean since 2005    Sexual activity: Not Currently    Comment: 2005  Other Topics Concern   Not on file  Social History Narrative   Homosexual.   Never married.   Hx of methamph abuse; no IV drug use.  [place] remission drug program, no use since 2005.   Exercises regularly, says he eats a good diet.   Tob: quit 2004, 50 pack-yr hx.   No alcohol.   Social Determinants of Health   Financial Resource Strain: Low Risk  (06/13/2023)   Overall Financial Resource Strain (CARDIA)    Difficulty of Paying Living Expenses: Not hard at all  Food Insecurity: No Food Insecurity (06/13/2023)   Hunger Vital Sign    Worried About Running Out of Food in the Last Year: Never true    Ran Out of  Food in the Last Year: Never true  Transportation Needs: No Transportation Needs (06/13/2023)   PRAPARE - Administrator, Civil Service (Medical): No    Lack of Transportation (Non-Medical): No  Physical Activity: Sufficiently Active (06/13/2023)   Exercise Vital Sign    Days of Exercise per Week: 4 days    Minutes of Exercise per Session: 60 min  Stress: Stress Concern Present (06/13/2023)   [employer] of Occupational Health - Occupational Stress Questionnaire  Feeling of Stress : To some extent  Social Connections: Moderately Integrated (06/13/2023)   Social Connection and Isolation Panel [NHANES]    Frequency of Communication with Friends and Family: More than three times a week    Frequency of Social Gatherings with Friends and Family: Twice a week    Attends Religious Services: More than 4 times per year    Active Member of [employer] or Organizations: Yes    Attends Engineer, structural: More than 4 times per year    Marital Status: Never married    Tobacco Counseling Counseling given: Not Answered   Clinical Intake:  Pre-visit preparation completed: Yes  Pain : No/denies pain     BMI - recorded: 21.52 Nutritional Status: BMI of 19-24  Normal Nutritional Risks: None Diabetes: No  How often do you need to have someone help you when you read instructions, pamphlets, or other written materials from your doctor or pharmacy?: 1 - Never  Interpreter Needed?: No  Information entered by :: Lynniah Janoski, LPN   Activities of Daily Living    06/13/2023    9:27 AM  In your present state of health, do you have any difficulty performing the following activities:  Hearing? 1  Comment mild  Vision? 0  Difficulty concentrating or making decisions? 0  Walking or climbing stairs? 0  Dressing or bathing? 0  Doing errands, shopping? 0  Preparing Food and eating ? N  Using the Toilet? N  In the past six months, have you accidently leaked urine? N  Do you have  problems with loss of bowel control? N  Managing your Medications? N  Managing your Finances? N  Housekeeping or managing your Housekeeping? N    Patient Care Team: Jeoffrey Massed, MD as PCP - General (Family Medicine) Lyn Records, MD (Inactive) as PCP - Cardiology (Cardiology) Lyn Records, MD (Inactive) (Cardiology) Cyndia Bent, MD (Inactive) (General Surgery) Charolett Bumpers, MD (Gastroenterology) Comer, Belia Heman, MD as Consulting Physician (Infectious Diseases) Lyn Records, MD (Inactive) as Consulting Physician (Cardiology) Cherlyn Roberts, MD as Consulting Physician (Dermatology) Lenda Kelp, MD as Consulting Physician (Sports Medicine) Abigail Miyamoto, MD as Consulting Physician (General Surgery) Josph Macho, MD as Consulting Physician (Oncology)  Indicate any recent Medical Services you may have received from other than [facility] providers in the past year (date may be approximate).     Assessment:   This is a routine wellness examination for Coree.  Hearing/Vision screen Hearing Screening - Comments:: Denies hearing difficulties    Dietary issues and exercise activities discussed:     Goals Addressed             This Visit's Progress    Patient Stated   On track    Maintain current health     Patient Stated   On track    Lose a couple lbs & resolve emotional challenges       Depression Screen    06/13/2023    9:32 AM 09/05/2022    8:35 AM 05/31/2022   10:52 AM 05/18/2021    9:59 AM 05/18/2021    9:51 AM 08/25/2020    8:10 AM 02/27/2020   11:15 AM  PHQ 2/9 Scores  PHQ - 2 Score 2 0 0 0 0 0 0  PHQ- 9 Score 3          Fall Risk    06/13/2023    9:35 AM 09/05/2022    8:35 AM 05/31/2022  10:54 AM 05/18/2021    9:52 AM 03/04/2021   10:34 AM  Fall Risk   Falls in the past year? 0 0 0 0 0  Number falls in past yr: 0 0 0 0 0  Injury with Fall? 0 0 0 0 0  Risk for fall due to : No Fall Risks No Fall Risks Impaired vision    Follow up  Falls prevention discussed;Falls evaluation completed Falls evaluation completed Falls prevention discussed Falls evaluation completed;Falls prevention discussed Falls evaluation completed    MEDICARE RISK AT HOME:  Medicare Risk at Home - 06/13/23 0935     Any stairs in or around the home? Yes   2 to get in home   If so, are there any without handrails? No    Home free of loose throw rugs in walkways, pet beds, electrical cords, etc? Yes    Adequate lighting in your home to reduce risk of falls? Yes    Life alert? No    Use of a cane, walker or w/c? No    Grab bars in the bathroom? No    Shower chair or bench in shower? No    Elevated toilet seat or a handicapped toilet? No             TIMED UP AND GO:  Was the test performed?  No    Cognitive Function:        06/13/2023    9:37 AM 05/31/2022   10:55 AM  6CIT Screen  What Year? 0 points 0 points  What month? 0 points 0 points  What time? 0 points 0 points  Count back from 20 0 points 0 points  Months in reverse 0 points 0 points  Repeat phrase 0 points 0 points  Total Score 0 points 0 points    Immunizations Immunization History  Administered Date(s) Administered   COVID-19, mRNA, vaccine(Comirnaty)12 years and older 09/29/2022   Fluad Quad(high Dose 65+) 08/13/2019, 08/26/2020, 08/25/2021, 09/05/2022   Hepatitis A 01/29/2013   Hepatitis A, Adult 08/20/2013   Influenza Split 09/04/2012   Influenza,inj,Quad PF,6+ Mos 09/27/2017   Influenza-Unspecified 09/18/2013, 09/22/2015, 10/10/2018   PFIZER Comirnaty(Gray Top)Covid-19 Tri-Sucrose Vaccine 04/21/2021   PFIZER(Purple Top)SARS-COV-2 Vaccination 01/17/2020, 02/11/2020, 09/17/2020   PPD Test 08/11/2009, 08/09/2011   Pfizer Covid-19 Vaccine Bivalent Booster 74yrs & up 09/05/2021, 04/19/2022   Pneumococcal Conjugate-13 03/15/2015   Pneumococcal Polysaccharide-23 08/03/2011, 02/20/2017   Tdap 09/07/2010   Zoster Recombinant(Shingrix) 09/12/2017, 09/16/2017    Zoster, Live 09/14/2014    TDAP status: Due, Education has been provided regarding the importance of this vaccine. Advised may receive this vaccine at local pharmacy or Health Dept. Aware to provide a copy of the vaccination record if obtained from local pharmacy or Health Dept. Verbalized acceptance and understanding.  Flu Vaccine status: Up to date  Pneumococcal vaccine status: Up to date  Covid-19 vaccine status: Information provided on how to obtain vaccines.  He is getting another next week before traveling.  Qualifies for Shingles Vaccine? Yes   Zostavax completed Yes   Shingrix Completed?: Yes  Screening Tests Health Maintenance  Topic Date Due   Zoster Vaccines- Shingrix (2 of 2) 11/11/2017   DTaP/Tdap/Td (2 - Td or Tdap) 09/07/2020   Colonoscopy  01/19/2021   COVID-19 Vaccine (8 - 2023-24 season) 11/24/2022   INFLUENZA VACCINE  07/12/2023   Medicare Annual Wellness (AWV)  06/12/2024   Pneumonia Vaccine 61+ Years old  Completed   Hepatitis C Screening  Completed  HPV VACCINES  Aged Out    Health Maintenance  Health Maintenance Due  Topic Date Due   Zoster Vaccines- Shingrix (2 of 2) 11/11/2017   DTaP/Tdap/Td (2 - Td or Tdap) 09/07/2020   Colonoscopy  01/19/2021   COVID-19 Vaccine (8 - 2023-24 season) 11/24/2022    Colorectal cancer screening: Referral to GI placed around 03/2023. Pt aware the office will call re: appt. He had to reschedule. He will call again soon.  Lung Cancer Screening: (Low Dose CT Chest recommended if Age 28-80 years, 20 pack-year currently smoking OR have quit w/in 15years.) does qualify.   Lung Cancer Screening Referral: will discuss with Dr. Milinda Cave  Additional Screening:  Hepatitis C Screening: does qualify; Completed 02/16/2014  Vision Screening: Recommended annual ophthalmology exams for early detection of glaucoma and other disorders of the eye. Is the patient up to date with their annual eye exam?  Yes  Who is the provider or what  is the name of the office in which the patient attends annual eye exams? [facility] If pt is not established with a provider, would they like to be referred to a provider to establish care? No .   Dental Screening: Recommended annual dental exams for proper oral hygiene   Community Resource Referral / Chronic Care Management: CRR required this visit?  No   CCM required this visit?  No     Plan:     I have personally reviewed and noted the following in the patient's chart:   Medical and social history Use of alcohol, tobacco or illicit drugs  Current medications and supplements including opioid prescriptions. Patient is not currently taking opioid prescriptions. Functional ability and status Nutritional status Physical activity Advanced directives List of other physicians Hospitalizations, surgeries, and ER visits in previous 12 months Vitals Screenings to include cognitive, depression, and falls Referrals and appointments  In addition, I have reviewed and discussed with patient certain preventive protocols, quality metrics, and best practice recommendations. A written personalized care plan for preventive services as well as general preventive health recommendations were provided to patient.    Deryk Bozman E Nealie Mchatton, LPN   4/0/9811   After Visit Summary: (MyChart) Due to this being a telephonic visit, the after visit summary with patients personalized plan was offered to patient via MyChart   Nurse Notes: DUE FOR TDAP, getting a covid booster next week. Unsure about Low Dose Chest CT - advised pt to discuss with PCP.

## 2023-06-20 ENCOUNTER — Ambulatory Visit: Payer: Medicare Other

## 2023-07-02 ENCOUNTER — Telehealth: Payer: Self-pay

## 2023-07-02 NOTE — Telephone Encounter (Signed)
Patient traveling to [place] in August.  Should he get the latest COVID booster before traveling out of the country? His last booster was given in Oct 2023. Pfizer.  Please advise.

## 2023-07-03 NOTE — Telephone Encounter (Signed)
Pt sent message.

## 2023-07-03 NOTE — Telephone Encounter (Signed)
Yes get booster

## 2023-07-03 NOTE — Telephone Encounter (Signed)
Please Advise

## 2023-07-23 ENCOUNTER — Other Ambulatory Visit: Payer: Self-pay | Admitting: Family Medicine

## 2023-07-23 MED ORDER — ALBUTEROL SULFATE HFA 108 (90 BASE) MCG/ACT IN AERS: 1.0000 | INHALATION_SPRAY | Freq: Four times a day (QID) | RESPIRATORY_TRACT | 0 refills | Status: DC | PRN

## 2023-08-15 ENCOUNTER — Other Ambulatory Visit: Payer: BC Managed Care – PPO

## 2023-08-15 ENCOUNTER — Other Ambulatory Visit: Payer: Self-pay

## 2023-08-15 ENCOUNTER — Other Ambulatory Visit (HOSPITAL_COMMUNITY)
Admission: RE | Admit: 2023-08-15 | Discharge: 2023-08-15 | Disposition: A | Discharge status: Home / Self Care | Payer: BC Managed Care – PPO | Source: Ambulatory Visit | Attending: Internal Medicine | Admitting: Internal Medicine

## 2023-08-15 DIAGNOSIS — Z113 Encounter for screening for infections with a predominantly sexual mode of transmission: Secondary | ICD-10-CM | POA: Insufficient documentation

## 2023-08-15 DIAGNOSIS — E7849 Other hyperlipidemia: Secondary | ICD-10-CM

## 2023-08-15 DIAGNOSIS — Z21 Asymptomatic human immunodeficiency virus [HIV] infection status: Secondary | ICD-10-CM | POA: Insufficient documentation

## 2023-08-16 LAB — HIV-1 RNA QUANT-NO REFLEX-BLD
HIV 1 RNA Quant: <20 {copies}/mL — ABNORMAL HIGH
HIV-1 RNA Quant, Log: <1.3 {Log_copies}/mL — ABNORMAL HIGH

## 2023-08-16 LAB — LIPID PANEL
Cholesterol: 145 mg/dL (ref <200)
HDL: 41 mg/dL (ref >=40)
LDL Cholesterol (Calc): 81 mg/dL
Non-HDL Cholesterol (Calc): 104 mg/dL (ref <130)
Total CHOL/HDL Ratio: 3.5 (calc) (ref <5.0)
Triglycerides: 125 mg/dL (ref <150)

## 2023-08-16 LAB — T-HELPER CELL (CD4) - (RCID CLINIC ONLY)
CD4 % Helper T Cell: 37 % (ref 33–65)
CD4 T Cell Abs: 722 /uL (ref 400–1790)

## 2023-08-16 LAB — URINE CYTOLOGY ANCILLARY ONLY
Chlamydia: NEGATIVE
Comment: NEGATIVE
Comment: NORMAL
Neisseria Gonorrhea: NEGATIVE

## 2023-08-16 LAB — RPR: RPR Ser Ql: NONREACTIVE

## 2023-08-29 ENCOUNTER — Other Ambulatory Visit: Payer: Self-pay

## 2023-08-29 ENCOUNTER — Encounter: Payer: Self-pay | Admitting: Internal Medicine

## 2023-08-29 ENCOUNTER — Ambulatory Visit: Payer: BC Managed Care – PPO | Admitting: Internal Medicine

## 2023-08-29 VITALS — BP 108/69 | HR 67 | Temp 97.7°F | Ht 70.0 in | Wt 156.0 lb

## 2023-08-29 DIAGNOSIS — Z23 Encounter for immunization: Secondary | ICD-10-CM | POA: Diagnosis not present

## 2023-08-29 DIAGNOSIS — Z21 Asymptomatic human immunodeficiency virus [HIV] infection status: Secondary | ICD-10-CM

## 2023-08-29 DIAGNOSIS — Z113 Encounter for screening for infections with a predominantly sexual mode of transmission: Secondary | ICD-10-CM

## 2023-08-29 DIAGNOSIS — B20 Human immunodeficiency virus [HIV] disease: Secondary | ICD-10-CM

## 2023-08-29 MED ORDER — BIKTARVY 50-200-25 MG PO TABS: 1.0000 | ORAL_TABLET | Freq: Every day | ORAL | 3 refills | Status: DC

## 2023-08-29 NOTE — Assessment & Plan Note (Signed)
Screened negative

## 2023-08-29 NOTE — Assessment & Plan Note (Signed)
Whitley continues to do well on his current regimen and no changes indicated.  Labs reviewed with him and discussed the nature of 'detected' on the result and still being considered undetectable at <20.   Refills provided and he can rtc in 1 year.

## 2023-08-29 NOTE — Assessment & Plan Note (Signed)
Flu shot discussed and given today

## 2023-08-29 NOTE — Progress Notes (Signed)
Subjective:    Patient ID: Edwin Padilla, male    DOB: 1951/10/05, 72 y.o.   MRN: [id number]  HPI Edwin Padilla is here for follow up of HIV He continues on Biktarvy with no missed doses.  Here for his yearly follow up.  No issues with getting or taking his medication.     Review of Systems  Constitutional:  Negative for fatigue.  Gastrointestinal:  Negative for diarrhea and nausea.  Skin:  Negative for rash.       Objective:   Physical Exam Eyes:     General: No scleral icterus. Pulmonary:     Effort: Pulmonary effort is normal.  Neurological:     Mental Status: He is alert.   SH: no tobacco        Assessment & Plan:

## 2023-08-30 ENCOUNTER — Inpatient Hospital Stay: Payer: BC Managed Care – PPO

## 2023-08-30 ENCOUNTER — Inpatient Hospital Stay: Payer: BC Managed Care – PPO | Admitting: Hematology & Oncology

## 2023-09-04 ENCOUNTER — Encounter: Payer: Self-pay | Admitting: Hematology & Oncology

## 2023-09-04 ENCOUNTER — Other Ambulatory Visit: Payer: Self-pay

## 2023-09-04 ENCOUNTER — Inpatient Hospital Stay: Payer: BC Managed Care – PPO | Attending: Hematology & Oncology

## 2023-09-04 ENCOUNTER — Inpatient Hospital Stay (HOSPITAL_BASED_OUTPATIENT_CLINIC_OR_DEPARTMENT_OTHER): Payer: BC Managed Care – PPO | Admitting: Hematology & Oncology

## 2023-09-04 VITALS — BP 118/69 | HR 52 | Temp 98.2°F | Resp 18 | Ht 70.0 in | Wt 155.0 lb

## 2023-09-04 DIAGNOSIS — D472 Monoclonal gammopathy: Secondary | ICD-10-CM

## 2023-09-04 DIAGNOSIS — C880 Waldenstrom macroglobulinemia: Secondary | ICD-10-CM | POA: Diagnosis present

## 2023-09-04 LAB — CBC WITH DIFFERENTIAL (CANCER CENTER ONLY)
Abs Immature Granulocytes: 0.02 10*3/uL (ref 0.00–0.07)
Basophils Absolute: 0 10*3/uL (ref 0.0–0.1)
Basophils Relative: 0 %
Eosinophils Absolute: 0.2 10*3/uL (ref 0.0–0.5)
Eosinophils Relative: 2 %
HCT: 43 % (ref 39.0–52.0)
Hemoglobin: 14.7 g/dL (ref 13.0–17.0)
Immature Granulocytes: 0 %
Lymphocytes Relative: 24 %
Lymphs Abs: 1.9 10*3/uL (ref 0.7–4.0)
MCH: 31.2 pg (ref 26.0–34.0)
MCHC: 34.2 g/dL (ref 30.0–36.0)
MCV: 91.3 fL (ref 80.0–100.0)
Monocytes Absolute: 0.6 10*3/uL (ref 0.1–1.0)
Monocytes Relative: 8 %
Neutro Abs: 5 10*3/uL (ref 1.7–7.7)
Neutrophils Relative %: 66 %
Platelet Count: 283 10*3/uL (ref 150–400)
RBC: 4.71 MIL/uL (ref 4.22–5.81)
RDW: 13.6 % (ref 11.5–15.5)
WBC Count: 7.7 10*3/uL (ref 4.0–10.5)
nRBC: 0 % (ref 0.0–0.2)

## 2023-09-04 LAB — CMP (CANCER CENTER ONLY)
ALT: 22 U/L (ref 0–44)
AST: 18 U/L (ref 15–41)
Albumin: 4.1 g/dL (ref 3.5–5.0)
Alkaline Phosphatase: 51 U/L (ref 38–126)
Anion gap: 7 (ref 5–15)
BUN: 20 mg/dL (ref 8–23)
CO2: 29 mmol/L (ref 22–32)
Calcium: 9.6 mg/dL (ref 8.9–10.3)
Chloride: 103 mmol/L (ref 98–111)
Creatinine: 1.27 mg/dL — ABNORMAL HIGH (ref 0.61–1.24)
GFR, Estimated: >60 mL/min (ref >60)
Glucose, Bld: 101 mg/dL — ABNORMAL HIGH (ref 70–99)
Potassium: 4.5 mmol/L (ref 3.5–5.1)
Sodium: 139 mmol/L (ref 135–145)
Total Bilirubin: 0.6 mg/dL (ref 0.3–1.2)
Total Protein: 7.6 g/dL (ref 6.5–8.1)

## 2023-09-04 LAB — LACTATE DEHYDROGENASE: LDH: 130 U/L (ref 98–192)

## 2023-09-04 NOTE — Progress Notes (Signed)
Hematology and Oncology Follow Up Visit  ELVEN GROOME [id number] 1951-05-31 72 y.o. 09/04/2023   Principle Diagnosis:  IgM smoldering lymphoplasmacytic lymphoma  Current Therapy:   Observation     Interim History:  Mr. Brando is back for follow-up.  He did have a wonderful time increase.  He went back himself.  He is incredibly active while he was over there.  [place] sounds like [place] to stay [place].  So happy that he enjoyed himself.  He is still working.  He is working full-time.  He is thinking about switching jobs.  He will have to see how everything goes.  As always, we talked about baseball.  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  He is very happy that they will be in the playoffs.  He asked he may go to a Oral Series game if the reading he is to make it.  As far as the smoldering lymphoma goes, this been holding pretty steady.  His last monoclonal spike was 1.3 g/dL.  His IgM level was 2098 mg/dL.  The Kappa light chain was 19.5 mg/dL.  He has had no fever.  He has had no headache.  He has had no mouth sores.  There is been no leg swelling.  He has had no bony pain.  There has been no nausea or vomiting.  Overall, I would have to say that his performance status is probably ECOG 0.      Medications:  Current Outpatient Medications:    albuterol (VENTOLIN HFA) 108 (90 Base) MCG/ACT inhaler, Inhale 1-2 puffs into the lungs every 6 (six) hours as needed for wheezing or shortness of breath., Disp: 1 each, Rfl: 0   aspirin 81 MG chewable tablet, Chew 81 mg by mouth daily., Disp: , Rfl:    atorvastatin (LIPITOR) 40 MG tablet, Take 1 tablet (40 mg total) by mouth daily., Disp: 90 tablet, Rfl: 3   bictegravir-emtricitabine-tenofovir AF (BIKTARVY) 50-200-25 MG TABS tablet, Take 1 tablet by mouth daily., Disp: 90 tablet, Rfl: 3   losartan (COZAAR) 25 MG tablet, Take 1 tablet (25 mg total) by mouth daily., Disp: 90 tablet, Rfl: 3  Allergies:  Allergies  Allergen Reactions   Cat  Hair Extract Shortness Of Breath   Lisinopril Cough    cough    Past Medical History, Surgical history, Social history, and Family History were reviewed and updated.  Review of Systems: Review of Systems  Constitutional: Negative.   HENT:  Negative.    Eyes: Negative.   Respiratory: Negative.    Cardiovascular: Negative.   Gastrointestinal: Negative.   Endocrine: Negative.   Genitourinary: Negative.    Musculoskeletal: Negative.   Skin: Negative.   Neurological: Negative.   Hematological: Negative.   Psychiatric/Behavioral: Negative.      Physical Exam:  height is 5\' 10"  (1.778 m) and weight is 155 lb (70.3 kg). His oral temperature is 98.2 F (36.8 C). His blood pressure is 118/69 and his pulse is 52 (abnormal). His respiration is 18 and oxygen saturation is 98%.   Wt Readings from Last 3 Encounters:  09/04/23 155 lb (70.3 kg)  08/29/23 156 lb (70.8 kg)  06/13/23 150 lb (68 kg)    Physical Exam Vitals reviewed.  HENT:     Head: Normocephalic and atraumatic.  Eyes:     Pupils: Pupils are equal, round, and reactive to light.  Cardiovascular:     Rate and Rhythm: Normal rate and regular rhythm.  Heart sounds: Normal heart sounds.  Pulmonary:     Effort: Pulmonary effort is normal.     Breath sounds: Normal breath sounds.  Abdominal:     General: Bowel sounds are normal.     Palpations: Abdomen is soft.  Musculoskeletal:        General: No tenderness or deformity. Normal range of motion.     Cervical back: Normal range of motion.  Lymphadenopathy:     Cervical: No cervical adenopathy.  Skin:    General: Skin is warm and dry.     Findings: No erythema or rash.  Neurological:     Mental Status: He is alert and oriented to person, place, and time.  Psychiatric:        Behavior: Behavior normal.        Thought Content: Thought content normal.        Judgment: Judgment normal.     Lab Results  Component Value Date   WBC 7.7 09/04/2023   HGB 14.7  09/04/2023   HCT 43.0 09/04/2023   MCV 91.3 09/04/2023   PLT 283 09/04/2023     Chemistry      Component Value Date/Time   NA 139 09/04/2023 1030   K 4.5 09/04/2023 1030   CL 103 09/04/2023 1030   CO2 29 09/04/2023 1030   BUN 20 09/04/2023 1030   CREATININE 1.27 (H) 09/04/2023 1030   CREATININE 1.26 08/16/2022 1011      Component Value Date/Time   CALCIUM 9.6 09/04/2023 1030   ALKPHOS 51 09/04/2023 1030   AST 18 09/04/2023 1030   ALT 22 09/04/2023 1030   BILITOT 0.6 09/04/2023 1030     Impression and Plan: Mr. Hood is a very nice 72 year old white male.  He has a lymphoplasmacytic lymphoma.  Everything is been holding relatively stable for him.  I would think that his monoclonal studies should be stable.  His total protein is not on the high side.  I am so happy that he has had a very good quality of life.  He has done what he would like to do.  Again, he may be changing jobs.  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  We will go ahead and plan to get him back after the Holidays now.   Josph Macho, MD 9/24/202411:35 AM

## 2023-09-05 LAB — IGG, IGA, IGM
IgA: 74 mg/dL (ref 61–437)
IgG (Immunoglobin G), Serum: 548 mg/dL — ABNORMAL LOW (ref 603–1613)
IgM (Immunoglobulin M), Srm: 2367 mg/dL — ABNORMAL HIGH (ref 15–143)

## 2023-09-05 LAB — KAPPA/LAMBDA LIGHT CHAINS
Kappa free light chain: 196.8 mg/L — ABNORMAL HIGH (ref 3.3–19.4)
Kappa, lambda light chain ratio: 8.67 — ABNORMAL HIGH (ref 0.26–1.65)
Lambda free light chains: 22.7 mg/L (ref 5.7–26.3)

## 2023-09-13 LAB — PROTEIN ELECTROPHORESIS, SERUM, WITH REFLEX
A/G Ratio: 1.1 (ref 0.7–1.7)
Albumin ELP: 3.9 g/dL (ref 2.9–4.4)
Alpha-1-Globulin: 0.2 g/dL (ref 0.0–0.4)
Alpha-2-Globulin: 0.6 g/dL (ref 0.4–1.0)
Beta Globulin: 0.7 g/dL (ref 0.7–1.3)
Gamma Globulin: 2.1 g/dL — ABNORMAL HIGH (ref 0.4–1.8)
Globulin, Total: 3.6 g/dL (ref 2.2–3.9)
M-Spike, %: 1.6 g/dL — ABNORMAL HIGH
SPEP Interpretation: 0
Total Protein ELP: 7.5 g/dL (ref 6.0–8.5)

## 2023-09-13 LAB — IMMUNOFIXATION REFLEX, SERUM
IgA: 70 mg/dL (ref 61–437)
IgG (Immunoglobin G), Serum: 528 mg/dL — ABNORMAL LOW (ref 603–1613)
IgM (Immunoglobulin M), Srm: 2268 mg/dL — ABNORMAL HIGH (ref 15–143)

## 2023-10-18 ENCOUNTER — Ambulatory Visit (INDEPENDENT_AMBULATORY_CARE_PROVIDER_SITE_OTHER): Payer: Medicare Other | Admitting: Family Medicine

## 2023-10-18 VITALS — BP 108/78 | Ht 70.0 in | Wt 151.0 lb

## 2023-10-18 DIAGNOSIS — M722 Plantar fascial fibromatosis: Secondary | ICD-10-CM

## 2023-10-18 NOTE — Patient Instructions (Signed)
You have plantar fasciitis Take tylenol and/or aleve as needed for pain  Plantar fascia stretch for 20-30 seconds (do 3 of these) in morning Lowering/raise on a step exercises 3 x 10 once or twice a day - this is very important for long term recovery. Can add heel walks, toe walks forward and backward as well Ice heel for 15 minutes as needed. Avoid flat shoes/barefoot walking as much as possible. Arch straps have been shown to help with pain. Inserts are important (dr. Zoe Lan active series, spencos, our green insoles, custom orthotics). Steroid injection is a consideration for short term pain relief if you are struggling. Physical therapy is also an option. Follow up with me in 6 weeks or as needed.

## 2023-10-18 NOTE — Progress Notes (Signed)
PCP: Jeoffrey Massed, MD  Subjective:   HPI: Patient is a 72 y.o. male here for possible foot injury while running.  Started 1 month ago while running on pavement.  Did not feel a crack or instant amount of pain.  Has since used more supportive shoes and is wearing the treadmill versus the road.  Denies any swelling, erythema, excessive warmth.  Is able to walk normally.  Pain lately is worse in the mornings and gets better with continued movement.  Never had this before.  Past Medical History:  Diagnosis Date   Allergy    Cat allergy   Bilateral primary osteoarthritis of knee    Coronary atherosclerosis of unspecified type of vessel, native or graft    CABG 07/2003; annual cardiology f/u (Dr. Garnette Scheuermann).  Stable as of 02/2017 f/u--echo 03/2017 showed normal LV fxn---ok to d/c ACE-I per cardiologist.   COVID-19 virus infection 05/05/2021   paxlovid   HIV infection (HCC) Dx 02/2002   ID clinic w/ [facility].   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 Myna Hidalgo following.   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   Substance abuse (HCC)    hx of    Current Outpatient Medications on File Prior to Visit  Medication Sig Dispense Refill   albuterol (VENTOLIN HFA) 108 (90 Base) MCG/ACT inhaler Inhale 1-2 puffs into the lungs every 6 (six) hours as needed for wheezing or shortness of breath. 1 each 0   aspirin 81 MG chewable tablet Chew 81 mg by mouth daily.     atorvastatin (LIPITOR) 40 MG tablet Take 1 tablet (40 mg  total) by mouth daily. 90 tablet 3   bictegravir-emtricitabine-tenofovir AF (BIKTARVY) 50-200-25 MG TABS tablet Take 1 tablet by mouth daily. 90 tablet 3   losartan (COZAAR) 25 MG tablet Take 1 tablet (25 mg total) by mouth daily. 90 tablet 3   No current facility-administered medications on file prior to visit.    Past Surgical History:  Procedure Laterality Date   biopsy lymph node groin  2022   BYPASS GRAFT  2004   CARDIOVASCULAR STRESS TEST  05/2016   No ischemia   COLONOSCOPY  01/2011   Normal (recall 10 yrs)   CORONARY ARTERY BYPASS GRAFT  2004   HERNIA REPAIR     as infant   INGUINAL HERNIA REPAIR Right 04/09/2013   Procedure: RIGHT HERNIA REPAIR INGUINAL ADULT;  Surgeon: Currie Paris, MD;  Location: [facility];  Service: General;  Laterality: Right;   LYMPH NODE BIOPSY Left 06/16/2021   Procedure: EXCISIONAL BIOPSY LEFT INGUINAL LYMPH NODE;  Surgeon: Abigail Miyamoto, MD;  Location: [facility];  Service: General;  Laterality: Left;   TONSILLECTOMY     TRANSESOPHAGEAL ECHOCARDIOGRAM  12/2020   normal (mild av thickening)   TRANSTHORACIC ECHOCARDIOGRAM  03/19/2017   Normal    Allergies  Allergen Reactions   Cat Hair Extract Shortness Of Breath   Lisinopril Cough    cough  BP 108/78   Ht 5\' 10"  (1.778 m)   Wt 151 lb (68.5 kg)   BMI 21.67 kg/m       No data to display              No data to display              Objective:  Physical Exam:  Gen: NAD, comfortable in exam room  Left foot: No erythema, warmth, swelling Mild TTP over plantar fascia from calcaneus to midfoot, no bony tenderness elicited Negative calcaneal squeeze Strength and sensation normal Negative talar tilt ROM normal   Assessment & Plan:  1. L foot pain: 2/2 plantar fasciitis based on history and exam.  Recommend active strength training with heel raises.  Can use Tylenol, ibuprofen, heat/ice if needed.  Continue staying active with more  supportive running shoes and limiting road/pavement running.  Follow-up in 1 month or as needed.  Janeal Holmes, MD PGY-2, [facility]

## 2023-10-21 ENCOUNTER — Encounter: Payer: Self-pay | Admitting: Family Medicine

## 2023-11-14 ENCOUNTER — Ambulatory Visit: Payer: Medicare Other | Admitting: Urgent Care

## 2023-11-14 ENCOUNTER — Encounter: Payer: Self-pay | Admitting: Urgent Care

## 2023-11-14 VITALS — BP 114/73 | HR 58 | Temp 98.3°F | Wt 155.1 lb

## 2023-11-14 DIAGNOSIS — R5383 Other fatigue: Secondary | ICD-10-CM

## 2023-11-14 DIAGNOSIS — N529 Male erectile dysfunction, unspecified: Secondary | ICD-10-CM | POA: Diagnosis not present

## 2023-11-14 DIAGNOSIS — I25708 Atherosclerosis of coronary artery bypass graft(s), unspecified, with other forms of angina pectoris: Secondary | ICD-10-CM | POA: Diagnosis not present

## 2023-11-14 DIAGNOSIS — J22 Unspecified acute lower respiratory infection: Secondary | ICD-10-CM

## 2023-11-14 DIAGNOSIS — E7849 Other hyperlipidemia: Secondary | ICD-10-CM

## 2023-11-14 DIAGNOSIS — R7989 Other specified abnormal findings of blood chemistry: Secondary | ICD-10-CM

## 2023-11-14 DIAGNOSIS — R948 Abnormal results of function studies of other organs and systems: Secondary | ICD-10-CM

## 2023-11-14 DIAGNOSIS — R300 Dysuria: Secondary | ICD-10-CM | POA: Diagnosis not present

## 2023-11-14 LAB — POCT URINALYSIS DIPSTICK
Glucose, UA: NEGATIVE
Ketones, UA: NEGATIVE
Nitrite, UA: POSITIVE
Protein, UA: POSITIVE — AB
Spec Grav, UA: 1.025 (ref 1.010–1.025)
Urobilinogen, UA: NEGATIVE U/dL — AB
pH, UA: 5.5 (ref 5.0–8.0)

## 2023-11-14 MED ORDER — AMOXICILLIN-POT CLAVULANATE 875-125 MG PO TABS: 1.0000 | ORAL_TABLET | Freq: Two times a day (BID) | ORAL | 0 refills | Status: DC

## 2023-11-14 MED ORDER — AZITHROMYCIN 250 MG PO TABS
ORAL_TABLET | ORAL | 0 refills | Status: AC
Start: 2023-11-14 — End: 2023-11-19

## 2023-11-14 NOTE — Progress Notes (Unsigned)
Established Patient Office Visit  Subjective:  Patient ID: Edwin Padilla, male    DOB: 10/21/1951  Age: 72 y.o. MRN: [id number]  Chief Complaint  Patient presents with   Cough    Cough and congestion, fatigue, urinary issues. And fever on and off.    Discussed the use of AI software for clinical note transcription with the patient who gave verbal consent to proceed.   72yo male with a history of long-term monoclonal gammopathy and HIV, presents with a two-week history of a cough and congestion, which he describes as a 'long-lasting cold.' The cough was initially improving but has since worsened, with no part of the day free from coughing. The cough is productive with clear sputum. He notes it is significantly worse in his own home, better outside. The patient denies any blood or brown coloration in the sputum. He also reports an intermittent low-grade fever, which has mostly resolved. He has experienced some postnasal drainage and nasal congestion, but denies any sinus pain, ear pressure, or sore throat. He also reports a recurring unilateral neck ache, which responds to aspirin. Denies lymphadenopathy and is very in tune with this given his monoclonal gammopathy.  The patient also reports a recent increase in urinary symptoms, including a slower and weaker stream, increased frequency, and burning during urination. The frequency and weak stream symptoms have been present for years but have recently worsened significantly and the dysuria is new over the past few days. He denies any blood in the urine, upper back or flank pain, and any skin rash or irritation. No discharge or concern for STI exposure. He also reports a sudden onset of erectile dysfunction in the last few days, which he has never experienced before. He was taking dextromethorphan and an expectorant for his cough, but stopped due to side effects including feeling 'woolly headed,' dizzy, fatigued, and an overall 'creepy' feeling. He  uncertain if there was an antihistamine or decongestant in his medication.  Pt has not had a physical performed since 2022, and labs have not been updated. He is fasting today and hoping to obtain labs particularly given his chronic fatigue.  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 Garnette Scheuermann).  Stable as of 02/2017 f/u--echo 03/2017 showed normal LV fxn---ok to d/c ACE-I per cardiologist.   COVID-19 virus infection 05/05/2021   paxlovid   HIV infection (HCC) Dx 02/2002   ID clinic w/ [facility].   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 Myna Hidalgo following.   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   Substance abuse (HCC)     hx of   Social History   Tobacco Use   Smoking status: Former    Current packs/day: 0.00    Types: Cigarettes    Start date: 12/11/1968    Quit date: 12/12/2003    Years since quitting: 19.9   Smokeless tobacco: Never  Vaping Use   Vaping status: Never Used  Substance Use Topics   Alcohol use: Never    Comment: 2005   Drug use: No    Comment: past history of crystal meth addiction -clean since 2005       ROS: as noted in HPI  Objective:     BP 114/73   Pulse (!) 58   Temp 98.3 F (36.8 C) (Oral)   Wt 155 lb 1.9 oz (70.4 kg)   SpO2 96%   BMI 22.26 kg/m  BP Readings from Last 3 Encounters:  11/14/23 114/73  10/18/23 108/78  09/04/23 118/69   Wt Readings from Last 3 Encounters:  11/14/23 155 lb 1.9 oz (70.4 kg)  10/18/23 151 lb (68.5 kg)  09/04/23 155 lb (70.3 kg)      Physical Exam Vitals and nursing note reviewed.  Constitutional:      General: He is not in acute distress.    Appearance: Normal appearance. He is not ill-appearing, toxic-appearing or diaphoretic.  HENT:     Head: Normocephalic and atraumatic.     Right Ear: Tympanic membrane, ear canal and external ear normal. There is no impacted cerumen.     Left Ear: Tympanic membrane, ear canal and external ear normal. There is no impacted cerumen.     Nose: Nose normal. No congestion or rhinorrhea.     Mouth/Throat:     Mouth: Mucous membranes are moist.     Pharynx: Oropharynx is clear. No oropharyngeal exudate or posterior oropharyngeal erythema.  Eyes:     General: No scleral icterus.       Right eye: No discharge.        Left eye: No discharge.     Extraocular Movements: Extraocular movements intact.     Pupils: Pupils are equal, round, and reactive to light.  Cardiovascular:     Rate and Rhythm: Normal rate and regular rhythm.  Pulmonary:     Effort: Pulmonary effort is normal. No respiratory distress.     Breath sounds: No stridor. Wheezing (bilateral apices posterior lung fields only)  present. No rhonchi or rales.  Chest:     Chest wall: No tenderness.  Genitourinary:    Comments: Exam deferred Musculoskeletal:     Cervical back: Normal range of motion and neck supple. No rigidity or tenderness.  Lymphadenopathy:     Cervical: No cervical adenopathy.  Skin:    General: Skin is warm and dry.     Findings: No erythema or rash.  Neurological:     Mental Status: He is alert.      Results for orders placed or performed in visit on 11/14/23  POCT urinalysis dipstick  Result Value Ref Range   Color, UA yellow    Clarity, UA cloudy    Glucose, UA Negative Negative   Bilirubin, UA 1+  Ketones, UA negative    Spec Grav, UA 1.025 1.010 - 1.025   Blood, UA 1+    pH, UA 5.5 5.0 - 8.0   Protein, UA Positive (A) Negative   Urobilinogen, UA negative (A) 0.2 or 1.0 E.U./dL   Nitrite, UA positive    Leukocytes, UA Small (1+) (A) Negative   Appearance     Odor      Last CBC Lab Results  Component Value Date   WBC 7.7 09/04/2023   HGB 14.7 09/04/2023   HCT 43.0 09/04/2023   MCV 91.3 09/04/2023   MCH 31.2 09/04/2023   RDW 13.6 09/04/2023   PLT 283 09/04/2023   Last metabolic panel Lab Results  Component Value Date   GLUCOSE 101 (H) 09/04/2023   NA 139 09/04/2023   K 4.5 09/04/2023   CL 103 09/04/2023   CO2 29 09/04/2023   BUN 20 09/04/2023   CREATININE 1.27 (H) 09/04/2023   GFRNONAA >60 09/04/2023   CALCIUM 9.6 09/04/2023   PROT 7.6 09/04/2023   ALBUMIN 4.1 09/04/2023   LABGLOB 3.6 09/04/2023   AGRATIO 1.1 09/04/2023   BILITOT 0.6 09/04/2023   ALKPHOS 51 09/04/2023   AST 18 09/04/2023   ALT 22 09/04/2023   ANIONGAP 7 09/04/2023      The 10-year ASCVD risk score (Arnett DK, et al., 2019) is: 18.6%  Assessment & Plan:  Dysuria -     POCT urinalysis dipstick -     Urine Culture -     Amoxicillin-Pot Clavulanate; Take 1 tablet by mouth 2 (two) times daily.  Dispense: 20 tablet; Refill: 0 -     Testosterone; Future -     PSA; Future  Acute  lower respiratory tract infection -     Amoxicillin-Pot Clavulanate; Take 1 tablet by mouth 2 (two) times daily.  Dispense: 20 tablet; Refill: 0 -     Azithromycin; Take 2 tablets on day 1, then 1 tablet daily on days 2 through 5  Dispense: 6 tablet; Refill: 0  Other fatigue -     CBC with Differential/Platelet; Future -     Lipid panel; Future -     TSH; Future -     Hemoglobin A1c; Future -     Comprehensive metabolic panel; Future -     Testosterone; Future -     PSA; Future  Erectile dysfunction, unspecified erectile dysfunction type -     Testosterone; Future -     PSA; Future  Coronary artery disease of bypass graft of native heart with stable angina pectoris (HCC) -     CBC with Differential/Platelet; Future -     Lipid panel; Future -     Hemoglobin A1c; Future -     Comprehensive metabolic panel; Future  Other hyperlipidemia -     Lipid panel; Future  Other specified abnormal findings of blood chemistry -     Hemoglobin A1c; Future  Abnormal results of function studies of other organs and systems -     PSA; Future   Assessment & Plan Upper Respiratory Infection Persistent cough for two weeks, productive with clear sputum. No fever currently, but history of low-grade fever. No sinus pain, but some nasal congestion. No ear pressure or sore throat. Mild wheezing noted on examination. -given immunodeficiency, pt at risk for lower resp bacterial infections. Will start concurrent azithro + augmentin to cover for CAP -Consider chest imaging pending response to treatment  Urinary Symptoms History of slow urination, recently worsened with  increased frequency and burning sensation. No hematuria or flank pain. Concurrent sudden onset of erectile dysfunction. -urine sample in office positive for urinary tract infection. -urine culture sent out -pt defers need for GC/chlam testing -Start augmentin BID which will cover both urinary and pulmonary sx -will have pt return for labs  next week to include PSA and will need to schedule follow up exam with Dr. Milinda Cave, will likely need prostate exam to assess for BPH  Erectile Dysfunction Sudden onset, concurrent with worsening urinary symptoms and upper respiratory infection. Possible side effect of dextromethorphan. -Address further at annual PE after completing tx for UTI. -draw PSA and testosterone levels  Pt to return next week for FASTING 8am labs, future lab orders placed today. Pt will return in about 3 weeks for annual physical.  Return in about 3 weeks (around 12/05/2023) for Annual Physical.   Maretta Bees, PA

## 2023-11-14 NOTE — Patient Instructions (Signed)
Please take Augmentin twice daily with food. This should help with both your pulmonary and urinary symptoms. We will call you if your urine culture comes back indicating a need for an alternative antibiotic  Please also take the azithromycin as prescribed per package instructions.  Do not take the OTC cough medications. Should you continue to have mucous production and/or wheezing, a low dose course of prednisone would be beneficial.  Please return to our office on or around 11/19/23 for FASTING labs, preferably at 8am due to the labs being drawn.  Please schedule an annual PE with Dr. Milinda Cave within the next few weeks.

## 2023-11-16 LAB — URINE CULTURE
MICRO NUMBER:: 15808833
SPECIMEN QUALITY:: ADEQUATE

## 2023-11-19 ENCOUNTER — Other Ambulatory Visit (INDEPENDENT_AMBULATORY_CARE_PROVIDER_SITE_OTHER): Payer: Medicare Other

## 2023-11-19 DIAGNOSIS — E7849 Other hyperlipidemia: Secondary | ICD-10-CM

## 2023-11-19 DIAGNOSIS — R300 Dysuria: Secondary | ICD-10-CM | POA: Diagnosis not present

## 2023-11-19 DIAGNOSIS — R948 Abnormal results of function studies of other organs and systems: Secondary | ICD-10-CM

## 2023-11-19 DIAGNOSIS — R7989 Other specified abnormal findings of blood chemistry: Secondary | ICD-10-CM | POA: Diagnosis not present

## 2023-11-19 DIAGNOSIS — I25708 Atherosclerosis of coronary artery bypass graft(s), unspecified, with other forms of angina pectoris: Secondary | ICD-10-CM

## 2023-11-19 DIAGNOSIS — N529 Male erectile dysfunction, unspecified: Secondary | ICD-10-CM | POA: Diagnosis not present

## 2023-11-19 DIAGNOSIS — R5383 Other fatigue: Secondary | ICD-10-CM

## 2023-11-19 LAB — CBC WITH DIFFERENTIAL/PLATELET
Basophils Absolute: 0 10*3/uL (ref 0.0–0.1)
Basophils Relative: 0.4 % (ref 0.0–3.0)
Eosinophils Absolute: 0.2 10*3/uL (ref 0.0–0.7)
Eosinophils Relative: 2.7 % (ref 0.0–5.0)
HCT: 45.8 % (ref 39.0–52.0)
Hemoglobin: 15.2 g/dL (ref 13.0–17.0)
Lymphocytes Relative: 27.5 % (ref 12.0–46.0)
Lymphs Abs: 2.2 10*3/uL (ref 0.7–4.0)
MCHC: 33.2 g/dL (ref 30.0–36.0)
MCV: 94 fL (ref 78.0–100.0)
Monocytes Absolute: 0.7 10*3/uL (ref 0.1–1.0)
Monocytes Relative: 8.9 % (ref 3.0–12.0)
Neutro Abs: 4.9 10*3/uL (ref 1.4–7.7)
Neutrophils Relative %: 60.5 % (ref 43.0–77.0)
Platelets: 472 10*3/uL — ABNORMAL HIGH (ref 150.0–400.0)
RBC: 4.87 Mil/uL (ref 4.22–5.81)
RDW: 14.3 % (ref 11.5–15.5)
WBC: 8.2 10*3/uL (ref 4.0–10.5)

## 2023-11-19 LAB — LIPID PANEL
Cholesterol: 125 mg/dL (ref 0–200)
HDL: 31.3 mg/dL — ABNORMAL LOW (ref >39.00)
LDL Cholesterol: 77 mg/dL (ref 0–99)
NonHDL: 93.28
Total CHOL/HDL Ratio: 4
Triglycerides: 83 mg/dL (ref 0.0–149.0)
VLDL: 16.6 mg/dL (ref 0.0–40.0)

## 2023-11-19 LAB — COMPREHENSIVE METABOLIC PANEL
ALT: 22 U/L (ref 0–53)
AST: 16 U/L (ref 0–37)
Albumin: 4 g/dL (ref 3.5–5.2)
Alkaline Phosphatase: 73 U/L (ref 39–117)
BUN: 20 mg/dL (ref 6–23)
CO2: 29 meq/L (ref 19–32)
Calcium: 10 mg/dL (ref 8.4–10.5)
Chloride: 102 meq/L (ref 96–112)
Creatinine, Ser: 1.08 mg/dL (ref 0.40–1.50)
GFR: 68.66 mL/min (ref >60.00)
Glucose, Bld: 102 mg/dL — ABNORMAL HIGH (ref 70–99)
Potassium: 5 meq/L (ref 3.5–5.1)
Sodium: 139 meq/L (ref 135–145)
Total Bilirubin: 0.5 mg/dL (ref 0.2–1.2)
Total Protein: 7.9 g/dL (ref 6.0–8.3)

## 2023-11-19 LAB — TESTOSTERONE: Testosterone: 446.78 ng/dL (ref 300.00–890.00)

## 2023-11-19 LAB — PSA: PSA: 4.67 ng/mL — ABNORMAL HIGH (ref 0.10–4.00)

## 2023-11-19 LAB — HEMOGLOBIN A1C: Hgb A1c MFr Bld: 6 % (ref 4.6–6.5)

## 2023-11-19 LAB — TSH: TSH: 1.9 u[IU]/mL (ref 0.35–5.50)

## 2023-11-20 ENCOUNTER — Encounter: Payer: Self-pay | Admitting: Family Medicine

## 2023-11-29 ENCOUNTER — Encounter: Payer: Self-pay | Admitting: Family Medicine

## 2023-11-29 ENCOUNTER — Ambulatory Visit: Payer: Medicare Other | Admitting: Family Medicine

## 2023-11-29 VITALS — BP 109/71 | HR 65 | Ht 69.5 in | Wt 153.6 lb

## 2023-11-29 DIAGNOSIS — E7800 Pure hypercholesterolemia, unspecified: Secondary | ICD-10-CM

## 2023-11-29 DIAGNOSIS — J209 Acute bronchitis, unspecified: Secondary | ICD-10-CM

## 2023-11-29 DIAGNOSIS — I10 Essential (primary) hypertension: Secondary | ICD-10-CM

## 2023-11-29 DIAGNOSIS — N3001 Acute cystitis with hematuria: Secondary | ICD-10-CM | POA: Diagnosis not present

## 2023-11-29 DIAGNOSIS — R972 Elevated prostate specific antigen [PSA]: Secondary | ICD-10-CM

## 2023-11-29 MED ORDER — TETANUS-DIPHTH-ACELL PERTUSSIS 5-2.5-18.5 LF-MCG/0.5 IM SUSP: 0.5000 mL | Freq: Once | INTRAMUSCULAR | 0 refills | Status: AC

## 2023-11-29 NOTE — Progress Notes (Signed)
OFFICE VISIT  11/29/2023  CC:  Chief Complaint  Patient presents with   Annual Exam    Pt is not fasting.     Patient is a 72 y.o. male who presents for f/u HTN and HLD.  INTERIM HX: He was here for a prolonged resp illness + UTI on 11/14/2023, culture pansensitive E. coli, treated with Augmentin. PSA was checked and it was slightly elevated to 4.67.   Past Medical History:  Diagnosis Date   Allergy    Cat allergy   Bilateral primary osteoarthritis of knee    Coronary atherosclerosis of unspecified type of vessel, native or graft    CABG 07/2003; annual cardiology f/u (Dr. Garnette Scheuermann).  Stable as of 02/2017 f/u--echo 03/2017 showed normal LV fxn---ok to d/c ACE-I per cardiologist.   COVID-19 virus infection 05/05/2021   paxlovid   HIV infection (HCC) Dx 02/2002   ID clinic w/ [facility].   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 Myna Hidalgo following.   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    Past Surgical History:  Procedure Laterality Date   biopsy lymph node groin  2022   BYPASS GRAFT  2004   CARDIOVASCULAR STRESS TEST  05/2016   No ischemia   COLONOSCOPY  01/2011   Normal (recall 10 yrs)   CORONARY ARTERY BYPASS GRAFT  2004   HERNIA REPAIR     as infant   INGUINAL HERNIA REPAIR Right 04/09/2013   Procedure: RIGHT HERNIA REPAIR INGUINAL ADULT;  Surgeon: Currie Paris, MD;  Location: [facility];  Service: General;  Laterality: Right;   LYMPH NODE BIOPSY Left 06/16/2021    Procedure: EXCISIONAL BIOPSY LEFT INGUINAL LYMPH NODE;  Surgeon: Abigail Miyamoto, MD;  Location: [facility];  Service: General;  Laterality: Left;   TONSILLECTOMY     TRANSESOPHAGEAL ECHOCARDIOGRAM  12/2020   normal (mild av thickening)   TRANSTHORACIC ECHOCARDIOGRAM  03/19/2017   Normal   Social History   Socioeconomic History   Marital status: Single    Spouse name: Not on file   Number of children: Not on file   Years of education: Not on file   Highest education level: Not on file  Occupational History   Not on file  Tobacco Use   Smoking status: Former    Current packs/day: 0.00    Types: Cigarettes    Start date: 12/11/1968    Quit date: 12/12/2003    Years since quitting: 19.9   Smokeless tobacco: Never  Vaping Use   Vaping status: Never Used  Substance and Sexual Activity   Alcohol use: Never    Comment: 2005   Drug use: No    Comment: past history of crystal meth addiction -clean since 2005    Sexual activity: Not Currently    Comment: 2005  Other Topics Concern   Not on file  Social History Narrative   Homosexual.   Never married.   Hx of methamph abuse; no IV drug use.  [place] remission drug program, no use since 2005.   Exercises regularly, says he eats a good diet.   Tob: quit 2004, 50 pack-yr hx.   No alcohol.   Social Drivers of Corporate investment banker Strain: Low Risk  (06/13/2023)   Overall Financial Resource Strain (CARDIA)    Difficulty of Paying Living Expenses: Not hard at all  Food Insecurity: No Food Insecurity (06/13/2023)   Hunger Vital Sign    Worried About Running Out of Food in the Last Year: Never true    Ran Out of Food in the Last Year: Never true  Transportation Needs: No Transportation Needs (06/13/2023)   PRAPARE - Administrator, Civil Service (Medical): No    Lack of Transportation (Non-Medical): No  Physical Activity: Sufficiently Active (06/13/2023)   Exercise Vital Sign    Days of Exercise per  Week: 4 days    Minutes of Exercise per Session: 60 min  Stress: Stress Concern Present (06/13/2023)   [employer] of Occupational Health - Occupational Stress Questionnaire    Feeling of Stress : To some extent  Social Connections: Moderately Integrated (06/13/2023)   Social Connection and Isolation Panel [NHANES]    Frequency of Communication with Friends and Family: More than three times a week    Frequency of Social Gatherings with Friends and Family: Twice a week    Attends Religious Services: More than 4 times per year    Active Member of [employer] or Organizations: Yes    Attends Engineer, structural: More than 4 times per year    Marital Status: Never married   Follow-up hypertension and hyperlipidemia.  Outpatient Medications Prior to Visit  Medication Sig Dispense Refill   albuterol (VENTOLIN HFA) 108 (90 Base) MCG/ACT inhaler Inhale 1-2 puffs into the lungs every 6 (six) hours as needed for wheezing or shortness of breath. 1 each 0   amoxicillin-clavulanate (AUGMENTIN) 875-125 MG tablet Take 1 tablet by mouth 2 (two) times daily. 20 tablet 0   aspirin 81 MG chewable tablet Chew 81 mg by mouth daily.     atorvastatin (LIPITOR) 40 MG tablet Take 1 tablet (40 mg total) by mouth daily. 90 tablet 3   bictegravir-emtricitabine-tenofovir AF (BIKTARVY) 50-200-25 MG TABS tablet Take 1 tablet by mouth daily. 90 tablet 3   losartan (COZAAR) 25 MG tablet Take 1 tablet (25 mg total) by mouth daily. 90 tablet 3   No facility-administered medications prior to visit.    Allergies  Allergen Reactions   Cat Hair Extract Shortness Of Breath   Lisinopril Cough    cough    Review of Systems As per HPI  PE:    11/29/2023   10:48 AM 11/14/2023    9:33 AM 10/18/2023   10:38 AM  Vitals with BMI  Height 5' 9.5"  5\' 10"   Weight 153 lbs 10 oz 155 lbs 2 oz 151 lbs  BMI 22.37  21.67  Systolic 109 114 413  Diastolic 71 73 78  Pulse 65 58      Physical Exam  Gen: Alert, well  appearing.  Patient is oriented to person, place, time, and situation. AFFECT: pleasant, lucid thought and speech. ENT: Ears: EACs clear, normal epithelium.  TMs with good light reflex and landmarks bilaterally.  Eyes: no injection, icteris, swelling, or exudate.  EOMI, PERRLA. Nose: no drainage or turbinate edema/swelling.  No injection or focal lesion.  Mouth: lips without lesion/swelling.  Oral mucosa pink and moist.  Dentition intact  and without obvious caries or gingival swelling.  Oropharynx without erythema, exudate, or swelling.  Neck: supple/nontender.  No LAD, mass, or TM.  Carotid pulses 2+ bilaterally, without bruits. CV: RRR, no m/r/g.   LUNGS: CTA bilat, nonlabored resps, good aeration in all lung fields. ABD: soft, NT, ND, BS normal.  No hepatospenomegaly or mass.  No bruits. EXT: no clubbing, cyanosis, or edema.  Musculoskeletal: no joint swelling, erythema, warmth, or tenderness.  ROM of all joints intact. Skin - no sores or suspicious lesions or rashes or color changes   LABS:  Last CBC Lab Results  Component Value Date   WBC 8.2 11/19/2023   HGB 15.2 11/19/2023   HCT 45.8 11/19/2023   MCV 94.0 11/19/2023   MCH 31.2 09/04/2023   RDW 14.3 11/19/2023   PLT 472.0 (H) 11/19/2023   Last metabolic panel Lab Results  Component Value Date   GLUCOSE 102 (H) 11/19/2023   NA 139 11/19/2023   K 5.0 11/19/2023   CL 102 11/19/2023   CO2 29 11/19/2023   BUN 20 11/19/2023   CREATININE 1.08 11/19/2023   GFR 68.66 11/19/2023   CALCIUM 10.0 11/19/2023   PROT 7.9 11/19/2023   ALBUMIN 4.0 11/19/2023   LABGLOB 3.6 09/04/2023   AGRATIO 1.1 09/04/2023   BILITOT 0.5 11/19/2023   ALKPHOS 73 11/19/2023   AST 16 11/19/2023   ALT 22 11/19/2023   ANIONGAP 7 09/04/2023   Last lipids Lab Results  Component Value Date   CHOL 125 11/19/2023   HDL 31.30 (L) 11/19/2023   LDLCALC 77 11/19/2023   TRIG 83.0 11/19/2023   CHOLHDL 4 11/19/2023   Last hemoglobin A1c Lab Results   Component Value Date   HGBA1C 6.0 11/19/2023   Last thyroid functions Lab Results  Component Value Date   TSH 1.90 11/19/2023   Lab Results  Component Value Date   PSA 4.67 (H) 11/19/2023   PSA 0.83 08/25/2021   PSA 0.38 08/25/2020    IMPRESSION AND PLAN:  #1 hypertension, well-controlled on losartan 25 mg a day. Electrolytes and creatinine normal.  2.  Hypercholesterolemia, doing well on Lipitor 40 mg a day. Lipid panel at goal.  3.  Acute bronchitis, resolving gradually.  No new medications at this time.  4.  UTI: Symptoms resolved with antibiotics. He does have a history of some mild BPH symptoms that fluctuate in severity.  He says they are currently at a level he can certainly live with.  #5 preventative health care: Vaccines:  Tdap->rx sent to pharm.  Shingrix-->UTD per pt (approx 2019).  O/W utd. Normal colonoscopy 2012.  Due for repeat.  He wants to defer at this time. Prostate cancer screening: PSA mildly elevated on 11/19/2023 in the setting of UTI. He has follow-up with his oncologist on 12/24/2022 and he will try to get a repeat done at that visit --> this would be the best interval of time to reassess.  An After Visit Summary was printed and given to the patient.  FOLLOW UP: Return in about 6 months (around 05/29/2024) for routine chronic illness f/u.  Signed:  Santiago Bumpers, MD           11/29/2023

## 2023-12-03 ENCOUNTER — Encounter: Payer: Self-pay | Admitting: Urgent Care

## 2023-12-04 ENCOUNTER — Other Ambulatory Visit: Payer: Self-pay | Admitting: Urgent Care

## 2023-12-04 MED ORDER — SULFAMETHOXAZOLE-TRIMETHOPRIM 800-160 MG PO TABS: 1.0000 | ORAL_TABLET | Freq: Two times a day (BID) | ORAL | 0 refills | Status: DC

## 2023-12-12 DIAGNOSIS — R3129 Other microscopic hematuria: Secondary | ICD-10-CM

## 2023-12-12 HISTORY — DX: Other microscopic hematuria: R31.29

## 2023-12-17 ENCOUNTER — Telehealth: Payer: Self-pay | Admitting: Hematology & Oncology

## 2023-12-17 NOTE — Telephone Encounter (Signed)
 Left Message - Left voicemail for patient regarding rescheduling appt due to dr.e beig out of the office. left office number for patient to call back and reschedule appt.

## 2023-12-18 ENCOUNTER — Telehealth: Payer: Self-pay

## 2023-12-18 DIAGNOSIS — Z21 Asymptomatic human immunodeficiency virus [HIV] infection status: Secondary | ICD-10-CM

## 2023-12-18 MED ORDER — BIKTARVY 50-200-25 MG PO TABS: 1.0000 | ORAL_TABLET | Freq: Every day | ORAL | 1 refills | Status: DC

## 2023-12-18 NOTE — Telephone Encounter (Signed)
 Called patient to confirm pharmacy after receiving refill request for Biktarvy  from [facility] pharmacy. Last prescription sent to [facility]. Spoke with patient who confirmed he would like medication sent to [facility] due to insurance change. Refills sent. Lorenda CHRISTELLA Code, RMA

## 2023-12-20 ENCOUNTER — Other Ambulatory Visit: Payer: Self-pay

## 2023-12-20 ENCOUNTER — Other Ambulatory Visit (HOSPITAL_COMMUNITY): Payer: Self-pay

## 2023-12-20 DIAGNOSIS — Z21 Asymptomatic human immunodeficiency virus [HIV] infection status: Secondary | ICD-10-CM

## 2023-12-20 MED ORDER — BIKTARVY 50-200-25 MG PO TABS: 1.0000 | ORAL_TABLET | Freq: Every day | ORAL | 1 refills | Status: DC

## 2023-12-25 ENCOUNTER — Inpatient Hospital Stay: Payer: Medicare Other

## 2023-12-25 ENCOUNTER — Encounter: Payer: Self-pay | Admitting: Hematology & Oncology

## 2023-12-25 ENCOUNTER — Other Ambulatory Visit: Payer: Self-pay | Admitting: *Deleted

## 2023-12-25 ENCOUNTER — Ambulatory Visit: Payer: Medicare Other | Admitting: Hematology & Oncology

## 2023-12-25 DIAGNOSIS — Z125 Encounter for screening for malignant neoplasm of prostate: Secondary | ICD-10-CM

## 2023-12-25 DIAGNOSIS — R972 Elevated prostate specific antigen [PSA]: Secondary | ICD-10-CM

## 2023-12-25 DIAGNOSIS — R300 Dysuria: Secondary | ICD-10-CM

## 2023-12-27 ENCOUNTER — Other Ambulatory Visit: Payer: Self-pay

## 2023-12-27 MED ORDER — ATORVASTATIN CALCIUM 40 MG PO TABS: 40.0000 mg | ORAL_TABLET | Freq: Every day | ORAL | 0 refills | Status: DC

## 2023-12-27 MED ORDER — LOSARTAN POTASSIUM 25 MG PO TABS: 25.0000 mg | ORAL_TABLET | Freq: Every day | ORAL | 0 refills | Status: DC

## 2024-01-01 ENCOUNTER — Encounter: Payer: Self-pay | Admitting: Hematology & Oncology

## 2024-01-01 ENCOUNTER — Inpatient Hospital Stay: Payer: Medicare Other

## 2024-01-01 ENCOUNTER — Other Ambulatory Visit: Payer: Self-pay

## 2024-01-01 ENCOUNTER — Inpatient Hospital Stay: Payer: Medicare Other | Attending: Hematology & Oncology

## 2024-01-01 ENCOUNTER — Inpatient Hospital Stay (HOSPITAL_BASED_OUTPATIENT_CLINIC_OR_DEPARTMENT_OTHER): Payer: Medicare Other | Admitting: Hematology & Oncology

## 2024-01-01 ENCOUNTER — Encounter: Payer: Self-pay | Admitting: Family Medicine

## 2024-01-01 VITALS — BP 115/73 | HR 47 | Temp 97.4°F | Resp 18 | Wt 157.0 lb

## 2024-01-01 DIAGNOSIS — C8300 Small cell B-cell lymphoma, unspecified site: Secondary | ICD-10-CM | POA: Diagnosis not present

## 2024-01-01 DIAGNOSIS — R972 Elevated prostate specific antigen [PSA]: Secondary | ICD-10-CM | POA: Insufficient documentation

## 2024-01-01 DIAGNOSIS — D472 Monoclonal gammopathy: Secondary | ICD-10-CM

## 2024-01-01 DIAGNOSIS — Z79899 Other long term (current) drug therapy: Secondary | ICD-10-CM | POA: Diagnosis not present

## 2024-01-01 DIAGNOSIS — Z87440 Personal history of urinary (tract) infections: Secondary | ICD-10-CM | POA: Diagnosis not present

## 2024-01-01 DIAGNOSIS — N3001 Acute cystitis with hematuria: Secondary | ICD-10-CM

## 2024-01-01 DIAGNOSIS — R300 Dysuria: Secondary | ICD-10-CM | POA: Diagnosis not present

## 2024-01-01 DIAGNOSIS — N401 Enlarged prostate with lower urinary tract symptoms: Secondary | ICD-10-CM

## 2024-01-01 DIAGNOSIS — Z888 Allergy status to other drugs, medicaments and biological substances status: Secondary | ICD-10-CM | POA: Diagnosis not present

## 2024-01-01 LAB — CBC WITH DIFFERENTIAL (CANCER CENTER ONLY)
Abs Immature Granulocytes: 0.01 10*3/uL (ref 0.00–0.07)
Basophils Absolute: 0 10*3/uL (ref 0.0–0.1)
Basophils Relative: 0 %
Eosinophils Absolute: 0.3 10*3/uL (ref 0.0–0.5)
Eosinophils Relative: 4 %
HCT: 40.9 % (ref 39.0–52.0)
Hemoglobin: 14 g/dL (ref 13.0–17.0)
Immature Granulocytes: 0 %
Lymphocytes Relative: 33 %
Lymphs Abs: 2.2 10*3/uL (ref 0.7–4.0)
MCH: 31.2 pg (ref 26.0–34.0)
MCHC: 34.2 g/dL (ref 30.0–36.0)
MCV: 91.1 fL (ref 80.0–100.0)
Monocytes Absolute: 0.7 10*3/uL (ref 0.1–1.0)
Monocytes Relative: 11 %
Neutro Abs: 3.4 10*3/uL (ref 1.7–7.7)
Neutrophils Relative %: 52 %
Platelet Count: 285 10*3/uL (ref 150–400)
RBC: 4.49 MIL/uL (ref 4.22–5.81)
RDW: 14.3 % (ref 11.5–15.5)
WBC Count: 6.6 10*3/uL (ref 4.0–10.5)
nRBC: 0 % (ref 0.0–0.2)

## 2024-01-01 LAB — CMP (CANCER CENTER ONLY)
ALT: 21 U/L (ref 0–44)
AST: 19 U/L (ref 15–41)
Albumin: 4.1 g/dL (ref 3.5–5.0)
Alkaline Phosphatase: 57 U/L (ref 38–126)
Anion gap: 6 (ref 5–15)
BUN: 23 mg/dL (ref 8–23)
CO2: 28 mmol/L (ref 22–32)
Calcium: 9.6 mg/dL (ref 8.9–10.3)
Chloride: 104 mmol/L (ref 98–111)
Creatinine: 1.12 mg/dL (ref 0.61–1.24)
GFR, Estimated: >60 mL/min (ref >60)
Glucose, Bld: 82 mg/dL (ref 70–99)
Potassium: 4 mmol/L (ref 3.5–5.1)
Sodium: 138 mmol/L (ref 135–145)
Total Bilirubin: 0.7 mg/dL (ref 0.0–1.2)
Total Protein: 7.6 g/dL (ref 6.5–8.1)

## 2024-01-01 LAB — URINALYSIS, COMPLETE (UACMP) WITH MICROSCOPIC
Bilirubin Urine: NEGATIVE
Glucose, UA: NEGATIVE mg/dL
Ketones, ur: NEGATIVE mg/dL
Nitrite: NEGATIVE
Protein, ur: 30 mg/dL — AB
Specific Gravity, Urine: >=1.03 (ref 1.005–1.030)
pH: 5.5 (ref 5.0–8.0)

## 2024-01-01 LAB — LACTATE DEHYDROGENASE: LDH: 128 U/L (ref 98–192)

## 2024-01-01 MED ORDER — SULFAMETHOXAZOLE-TRIMETHOPRIM 800-160 MG PO TABS: 1.0000 | ORAL_TABLET | Freq: Two times a day (BID) | ORAL | 0 refills | Status: DC

## 2024-01-01 NOTE — Progress Notes (Signed)
Hematology and Oncology Follow Up Visit  Edwin Padilla [id number] 13-Dec-1950 72 y.o. 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  Unfortunately, Edwin Padilla was let go from his job at [employer].  I really hate this for him.  Edwin Padilla really had a nice thing going over there.  However, I am sure that Edwin Padilla will enjoy his early retirement.  Edwin Padilla is planning on going to [place] again.  Edwin Padilla is going go to [place] in June and go all around to the different [place] and go hiking.  Edwin Padilla had no problems over the holidays.  However, Edwin Padilla did have E. coli UTI.  I really think is going to have to go to urology.  Edwin Padilla was given antibiotics.  We will repeat another urinalysis on him today to see how everything looks.  As far as the IgM situation, his last monoclonal spike was 1.6 g/dL.  His IgM level was 2300 mg/dL.  The Kappa light chain was 20 mg/dL.  All of this is holding pretty steady.  Edwin Padilla has had no issues with fever.  Edwin Padilla has had no problems with COVID.  Edwin Padilla has had no cough or shortness of breath.  Edwin Padilla has had no change in bowel or bladder habits.  Edwin Padilla has had no rashes.  There is been no leg swelling.  Overall, I would say that his performance status is probably ECOG 1.    Medications:  Current Outpatient Medications:    albuterol (VENTOLIN HFA) 108 (90 Base) MCG/ACT inhaler, Inhale 1-2 puffs into the lungs every 6 (six) hours as needed for wheezing or shortness of breath., Disp: 1 each, Rfl: 0   amoxicillin-clavulanate (AUGMENTIN) 875-125 MG tablet, Take 1 tablet by mouth 2 (two) times daily., Disp: 20 tablet, Rfl: 0   aspirin 81 MG chewable tablet, Chew 81 mg by mouth daily., Disp: , Rfl:    atorvastatin (LIPITOR) 40 MG tablet, Take 1 tablet (40 mg total) by mouth daily., Disp: 90 tablet, Rfl: 0   bictegravir-emtricitabine-tenofovir AF (BIKTARVY) 50-200-25 MG TABS tablet, Take 1 tablet by mouth daily., Disp: 90  tablet, Rfl: 1   losartan (COZAAR) 25 MG tablet, Take 1 tablet (25 mg total) by mouth daily., Disp: 90 tablet, Rfl: 0   sulfamethoxazole-trimethoprim (BACTRIM DS) 800-160 MG tablet, Take 1 tablet by mouth 2 (two) times daily., Disp: 28 tablet, Rfl: 0  Allergies:  Allergies  Allergen Reactions   Cat Dander Shortness Of Breath   Lisinopril Cough    cough    Past Medical History, Surgical history, Social history, and Family History were reviewed and updated.  Review of Systems: Review of Systems  Constitutional: Negative.   HENT:  Negative.    Eyes: Negative.   Respiratory: Negative.    Cardiovascular: Negative.   Gastrointestinal: Negative.   Endocrine: Negative.   Genitourinary: Negative.    Musculoskeletal: Negative.   Skin: Negative.   Neurological: Negative.   Hematological: Negative.   Psychiatric/Behavioral: Negative.      Physical Exam: Temperature is 97.4.  Pulse 47.  Blood pressure 115/73.  Weight is 157 pounds.  Wt Readings from Last 3 Encounters:  11/29/23 153 lb 9.6 oz (69.7 kg)  11/14/23 155 lb 1.9 oz (70.4 kg)  10/18/23 151 lb (68.5 kg)    Physical Exam Vitals reviewed.  HENT:     Head: Normocephalic and atraumatic.  Eyes:     Pupils:  Pupils are equal, round, and reactive to light.  Cardiovascular:     Rate and Rhythm: Normal rate and regular rhythm.     Heart sounds: Normal heart sounds.  Pulmonary:     Effort: Pulmonary effort is normal.     Breath sounds: Normal breath sounds.  Abdominal:     General: Bowel sounds are normal.     Palpations: Abdomen is soft.  Musculoskeletal:        General: No tenderness or deformity. Normal range of motion.     Cervical back: Normal range of motion.  Lymphadenopathy:     Cervical: No cervical adenopathy.  Skin:    General: Skin is warm and dry.     Findings: No erythema or rash.  Neurological:     Mental Status: Edwin Padilla is alert and oriented to person, place, and time.  Psychiatric:        Behavior:  Behavior normal.        Thought Content: Thought content normal.        Judgment: Judgment normal.     Lab Results  Component Value Date   WBC 8.2 11/19/2023   HGB 15.2 11/19/2023   HCT 45.8 11/19/2023   MCV 94.0 11/19/2023   PLT 472.0 (H) 11/19/2023     Chemistry      Component Value Date/Time   NA 139 11/19/2023 0806   K 5.0 11/19/2023 0806   CL 102 11/19/2023 0806   CO2 29 11/19/2023 0806   BUN 20 11/19/2023 0806   CREATININE 1.08 11/19/2023 0806   CREATININE 1.27 (H) 09/04/2023 1030   CREATININE 1.26 08/16/2022 1011      Component Value Date/Time   CALCIUM 10.0 11/19/2023 0806   ALKPHOS 73 11/19/2023 0806   AST 16 11/19/2023 0806   AST 18 09/04/2023 1030   ALT 22 11/19/2023 0806   ALT 22 09/04/2023 1030   BILITOT 0.5 11/19/2023 0806   BILITOT 0.6 09/04/2023 1030     Impression and Plan: Edwin Padilla is a very nice 73 year old white male.  Edwin Padilla has a lymphoplasmacytic lymphoma.  Everything is been holding relatively stable for him.  I would think that his monoclonal studies should be stable.  His total protein is not on the high side.  Edwin Padilla is totally asymptomatic.  I will still follow him along.  His  CBC looks fine.  We will see what his urinalysis shows.  Will be very interesting to see what happens with his urology appointment.  If Edwin Padilla needs any kind of surgery for his prostate, I do not see a problem with this.  After mentioned that Edwin Padilla had a slightly elevated PSA when Edwin Padilla was checked recently.  It was 4.97.  We are repeating the PSA on him.   Josph Macho, MD 1/21/20258:20 AM

## 2024-01-01 NOTE — Telephone Encounter (Signed)
Will order referral to urologist. I will also send an antibiotic in.  I recommend you take it for 2 full weeks. Come back for office visit if symptoms do not subside significantly in the next 4 to 5 days.

## 2024-01-02 ENCOUNTER — Encounter: Payer: Self-pay | Admitting: *Deleted

## 2024-01-02 LAB — PSA, TOTAL AND FREE
PSA, Free Pct: 28.8 %
PSA, Free: 0.23 ng/mL
Prostate Specific Ag, Serum: 0.8 ng/mL (ref 0.0–4.0)

## 2024-01-02 LAB — IGG, IGA, IGM
IgA: 58 mg/dL — ABNORMAL LOW (ref 61–437)
IgG (Immunoglobin G), Serum: 534 mg/dL — ABNORMAL LOW (ref 603–1613)
IgM (Immunoglobulin M), Srm: 2451 mg/dL — ABNORMAL HIGH (ref 15–143)

## 2024-01-02 LAB — KAPPA/LAMBDA LIGHT CHAINS
Kappa free light chain: 213.1 mg/L — ABNORMAL HIGH (ref 3.3–19.4)
Kappa, lambda light chain ratio: 47.36 — ABNORMAL HIGH (ref 0.26–1.65)
Lambda free light chains: 4.5 mg/L — ABNORMAL LOW (ref 5.7–26.3)

## 2024-01-02 MED ORDER — TAMSULOSIN HCL 0.4 MG PO CAPS
0.4000 mg | ORAL_CAPSULE | Freq: Every day | ORAL | 1 refills | Status: DC
Start: 2024-01-02 — End: 2024-09-22

## 2024-01-02 NOTE — Telephone Encounter (Signed)
Flomax prescription sent. Take 1 tablet around bedtime. It is not uncommon to feel a little bit of lightheadedness when you stand up, but this should only be after the first dose of the medication.  Just wanted to make you aware. Should be totally fine after that.

## 2024-01-03 ENCOUNTER — Telehealth: Payer: Self-pay

## 2024-01-03 ENCOUNTER — Other Ambulatory Visit: Payer: Self-pay

## 2024-01-03 LAB — URINE CULTURE: Culture: >=100000 — AB

## 2024-01-03 MED ORDER — SULFAMETHOXAZOLE-TRIMETHOPRIM 800-160 MG PO TABS: 1.0000 | ORAL_TABLET | Freq: Two times a day (BID) | ORAL | 0 refills | Status: AC

## 2024-01-03 NOTE — Telephone Encounter (Signed)
-----   Message from Josph Macho sent at 01/03/2024  8:14 AM EST ----- Please call him and tell him that he has another urinary tract infection.  He has E. coli in his urine.  Please call in Bactrim DS 1 p.o. twice daily x 5 days.  Thanks.  Cindee Lame

## 2024-01-03 NOTE — Telephone Encounter (Signed)
Attempted to call patient and went to voicemail. MyChart message sent to patient.

## 2024-01-10 LAB — PROTEIN ELECTROPHORESIS, SERUM, WITH REFLEX
A/G Ratio: 1 (ref 0.7–1.7)
Albumin ELP: 3.7 g/dL (ref 2.9–4.4)
Alpha-1-Globulin: 0.2 g/dL (ref 0.0–0.4)
Alpha-2-Globulin: 0.6 g/dL (ref 0.4–1.0)
Beta Globulin: 1.1 g/dL (ref 0.7–1.3)
Gamma Globulin: 1.7 g/dL (ref 0.4–1.8)
Globulin, Total: 3.7 g/dL (ref 2.2–3.9)
M-Spike, %: 1 g/dL — ABNORMAL HIGH
SPEP Interpretation: 0
Total Protein ELP: 7.4 g/dL (ref 6.0–8.5)

## 2024-01-10 LAB — IMMUNOFIXATION REFLEX, SERUM
IgA: 73 mg/dL (ref 61–437)
IgG (Immunoglobin G), Serum: 640 mg/dL (ref 603–1613)
IgM (Immunoglobulin M), Srm: 2495 mg/dL — ABNORMAL HIGH (ref 15–143)

## 2024-02-08 DIAGNOSIS — R3912 Poor urinary stream: Secondary | ICD-10-CM | POA: Diagnosis not present

## 2024-02-08 DIAGNOSIS — R972 Elevated prostate specific antigen [PSA]: Secondary | ICD-10-CM | POA: Diagnosis not present

## 2024-02-08 DIAGNOSIS — N401 Enlarged prostate with lower urinary tract symptoms: Secondary | ICD-10-CM | POA: Diagnosis not present

## 2024-02-08 DIAGNOSIS — R3121 Asymptomatic microscopic hematuria: Secondary | ICD-10-CM | POA: Diagnosis not present

## 2024-02-08 DIAGNOSIS — N3020 Other chronic cystitis without hematuria: Secondary | ICD-10-CM | POA: Diagnosis not present

## 2024-02-15 ENCOUNTER — Encounter: Payer: Self-pay | Admitting: Urgent Care

## 2024-02-15 MED ORDER — SULFAMETHOXAZOLE-TRIMETHOPRIM 800-160 MG PO TABS: 1.0000 | ORAL_TABLET | Freq: Two times a day (BID) | ORAL | 0 refills | Status: AC

## 2024-03-11 DIAGNOSIS — K4020 Bilateral inguinal hernia, without obstruction or gangrene, not specified as recurrent: Secondary | ICD-10-CM | POA: Diagnosis not present

## 2024-03-11 DIAGNOSIS — R3121 Asymptomatic microscopic hematuria: Secondary | ICD-10-CM | POA: Diagnosis not present

## 2024-03-11 DIAGNOSIS — N3289 Other specified disorders of bladder: Secondary | ICD-10-CM | POA: Diagnosis not present

## 2024-03-11 DIAGNOSIS — R3129 Other microscopic hematuria: Secondary | ICD-10-CM | POA: Diagnosis not present

## 2024-03-11 DIAGNOSIS — R319 Hematuria, unspecified: Secondary | ICD-10-CM | POA: Diagnosis not present

## 2024-03-13 ENCOUNTER — Ambulatory Visit: Payer: Medicare Other | Attending: Cardiology | Admitting: Cardiology

## 2024-03-13 ENCOUNTER — Encounter: Payer: Self-pay | Admitting: Cardiology

## 2024-03-13 VITALS — BP 108/70 | HR 51 | Ht 70.0 in | Wt 156.4 lb

## 2024-03-13 DIAGNOSIS — I25708 Atherosclerosis of coronary artery bypass graft(s), unspecified, with other forms of angina pectoris: Secondary | ICD-10-CM | POA: Insufficient documentation

## 2024-03-13 DIAGNOSIS — E7849 Other hyperlipidemia: Secondary | ICD-10-CM | POA: Diagnosis not present

## 2024-03-13 DIAGNOSIS — Z21 Asymptomatic human immunodeficiency virus [HIV] infection status: Secondary | ICD-10-CM | POA: Diagnosis not present

## 2024-03-13 DIAGNOSIS — D472 Monoclonal gammopathy: Secondary | ICD-10-CM | POA: Diagnosis not present

## 2024-03-13 NOTE — Patient Instructions (Signed)
 Medication Instructions:  The current medical regimen is effective;  continue present plan and medications.  *If you need a refill on your cardiac medications before your next appointment, please call your pharmacy*  Follow-Up: At [facility], you and your health needs are our priority.  As part of our continuing mission to provide you with exceptional heart care, our providers are all part of one team.  This team includes your primary Cardiologist (physician) and Advanced Practice Providers or APPs (Physician Assistants and Nurse Practitioners) who all work together to provide you with the care you need, when you need it.  Your next appointment:   1 year(s)  Provider:   Dr Donato Schultz   We recommend signing up for the patient portal called "MyChart".  Sign up information is provided on this After Visit Summary.  MyChart is used to connect with patients for Virtual Visits (Telemedicine).  Patients are able to view lab/test results, encounter notes, upcoming appointments, etc.  Non-urgent messages can be sent to your provider as well.   To learn more about what you can do with MyChart, go to [email or web address].        1st Floor: - Lobby - Registration  - Pharmacy  - Lab - Cafe  2nd Floor: - PV Lab - Diagnostic Testing (echo, CT, nuclear med)  3rd Floor: - Vacant  4th Floor: - TCTS (cardiothoracic surgery) - AFib Clinic - Structural Heart Clinic - Vascular Surgery  - Vascular Ultrasound  5th Floor: - HeartCare Cardiology (general and EP) - Clinical Pharmacy for coumadin, hypertension, lipid, weight-loss medications, and med management appointments    Valet parking services will be available as well.

## 2024-03-13 NOTE — Progress Notes (Signed)
 Cardiology Office Note:  .   Date:  03/13/2024  ID:  Edwin Padilla, DOB [birth year], MRN [id number] PCP: Jeoffrey Massed, MD  [facility] Providers Cardiologist:  None     History of Present Illness: .   Edwin Padilla is a 73 y.o. male Discussed the use of AI scribe software for clinical note transcription with the patient, who gave verbal consent to proceed.  History of Present Illness Edwin Padilla is a 73 year old male with coronary artery disease status post CABG who presents for follow-up.  He has a history of coronary artery disease and underwent coronary artery bypass grafting (CABG) in 2004. He reports doing well from a cardiovascular standpoint with no chest pain or shortness of breath. He is an avid runner and maintains an active lifestyle. An echocardiogram performed in 2022 showed an ejection fraction of 60% with no significant valvular abnormalities. His most recent EKG shows mild first-degree heart block. No episodes of syncope.  His past medical history includes HIV, hyperlipidemia, and lymphoma. He is currently on aspirin 81 mg, atorvastatin 40 mg for hyperlipidemia, and losartan 25 mg for hypertension. He also takes Radio producer for HIV and follows with hematology for IgM smoldering lymphoplasmacytic lymphoma.  His LDL cholesterol was 77 in December 2024, and his hemoglobin A1c is 6, indicating a slightly prediabetic range. He has no history of diabetes. His hemoglobin is 14, indicating no anemia, and his creatinine is 1.1, with a potassium level of 4.8, both within normal limits.      Studies Reviewed: Marland Kitchen   EKG Interpretation Date/Time:  Thursday March 13 2024 08:56:05 EDT Ventricular Rate:  50 PR Interval:  210 QRS Duration:  94 QT Interval:  436 QTC Calculation: 397 R Axis:   90  Text Interpretation: Sinus bradycardia with 1st degree A-V block Rightward axis When compared with ECG of 14-Jun-2021 08:43, No significant change was found Confirmed by Donato Schultz  [phone]) on 03/13/2024 8:57:43 AM    Results LABS LDL cholesterol: 77 (11/2023) HbA1c: 6 Hb: 14 Cr: 1.1 K: 4.8  DIAGNOSTIC Echocardiogram: Ejection fraction 60%, no significant valvular abnormalities (2022) EKG: Mild first-degree atrioventricular block (03/13/2024) Risk Assessment/Calculations:            Physical Exam:   VS:  BP 108/70   Pulse (!) 51   Ht 5\' 10"  (1.778 m)   Wt 156 lb 6.4 oz (70.9 kg)   SpO2 97%   BMI 22.44 kg/m    Wt Readings from Last 3 Encounters:  03/13/24 156 lb 6.4 oz (70.9 kg)  01/01/24 157 lb (71.2 kg)  11/29/23 153 lb 9.6 oz (69.7 kg)    GEN: Well nourished, well developed in no acute distress NECK: No JVD; No carotid bruits CARDIAC: RRR, no murmurs, no rubs, no gallops RESPIRATORY:  Clear to auscultation without rales, wheezing or rhonchi  ABDOMEN: Soft, non-tender, non-distended EXTREMITIES:  No edema; No deformity   ASSESSMENT AND PLAN: .    Assessment and Plan Assessment & Plan Coronary Artery Disease (CAD) status post Coronary Artery Bypass Grafting (CABG) CAD with CABG in 2004, currently asymptomatic with no chest pain or dyspnea. Echocardiogram in 2022 showed ejection fraction of 60% with no significant valvular abnormalities. EKG today shows mild first-degree heart block, no syncope. Discussed secondary prevention and atorvastatin's role in stabilizing soft plaque to prevent myocardial infarction. Emphasized that despite good stress test results, risk of myocardial infarction remains due to potential soft plaque rupture. - Continue aspirin 81 mg  daily - Continue atorvastatin 40 mg daily - Schedule annual follow-up unless symptoms develop  Hyperlipidemia LDL cholesterol was 77 in December 2024, close to target of less than 70. Atorvastatin effectively managing hyperlipidemia. Discussed goal of maintaining LDL below 70 to reduce cardiovascular risk. - Continue atorvastatin 40 mg daily - Monitor lipid levels  regularly  Hypertension Blood pressure well-controlled on losartan 25 mg daily. - Continue losartan 25 mg daily  HIV On Biktarvy for HIV management. Followed by hematology for IgM smoldering lymphoplasmacytic lymphoma. No issues related to HIV discussed during this visit. - Continue Biktarvy as prescribed - Follow up with hematology as needed  Prediabetes Hemoglobin A1c is 6, indicating prediabetes. Explained that while not indicative of diabetes, important to monitor and manage blood glucose levels to prevent progression. - Monitor blood glucose levels - Advise on lifestyle modifications to prevent progression to diabetes         Dispo: 1 yr  Signed, Donato Schultz, MD

## 2024-03-18 DIAGNOSIS — R3121 Asymptomatic microscopic hematuria: Secondary | ICD-10-CM | POA: Diagnosis not present

## 2024-03-18 DIAGNOSIS — R972 Elevated prostate specific antigen [PSA]: Secondary | ICD-10-CM | POA: Diagnosis not present

## 2024-03-18 DIAGNOSIS — R3912 Poor urinary stream: Secondary | ICD-10-CM | POA: Diagnosis not present

## 2024-03-18 DIAGNOSIS — N401 Enlarged prostate with lower urinary tract symptoms: Secondary | ICD-10-CM | POA: Diagnosis not present

## 2024-03-24 ENCOUNTER — Other Ambulatory Visit: Payer: Self-pay

## 2024-03-24 MED ORDER — ATORVASTATIN CALCIUM 40 MG PO TABS: 40.0000 mg | ORAL_TABLET | Freq: Every day | ORAL | 3 refills | Status: DC

## 2024-03-24 MED ORDER — LOSARTAN POTASSIUM 25 MG PO TABS: 25.0000 mg | ORAL_TABLET | Freq: Every day | ORAL | 3 refills | Status: DC

## 2024-04-02 ENCOUNTER — Encounter: Payer: Self-pay | Admitting: Family Medicine

## 2024-04-02 ENCOUNTER — Telehealth (INDEPENDENT_AMBULATORY_CARE_PROVIDER_SITE_OTHER): Admitting: Family Medicine

## 2024-04-02 DIAGNOSIS — J988 Other specified respiratory disorders: Secondary | ICD-10-CM

## 2024-04-02 DIAGNOSIS — M545 Low back pain, unspecified: Secondary | ICD-10-CM

## 2024-04-02 DIAGNOSIS — U071 COVID-19: Secondary | ICD-10-CM | POA: Diagnosis not present

## 2024-04-02 NOTE — Progress Notes (Signed)
 Virtual Visit via Video Note  I connected with Edwin Padilla  on 04/02/24 at  8:00 AM EDT by a video enabled telemedicine application and verified that I am speaking with the correct person using two identifiers.  Location patient: [place] Location provider:work or home office Persons participating in the virtual visit: patient, provider  I discussed the limitations and requested verbal permission for telemedicine visit. The patient expressed understanding and agreed to proceed.   HPI: 73 year old male being seen today for back pain as well as home COVID test positive. Approximately 5 days ago he started to get a cough.  Over the last couple of days things have progressed to nasal congestion/postnasal drip, worsening cough, subjective fever, fatigue.  He had a bit of wheezing briefly but nothing persistent and he has not had short of breath or chest pain. He feels like he may be starting to get over it now. Home COVID test positive today.  Also, he has had back pain for the last 2-1/2 weeks.  It began after he was lifting heavy bags of soil.  It is diffusely across the low back and sometimes extends into the hips.  No radiation down the legs, no paresthesias, no saddle anesthesia, no loss of bowel or bladder control.  No focal weakness in the legs. The pain is not particularly improving lately.  It is affecting his activities of daily living.  ROS: See pertinent positives and negatives per HPI.  Past Medical History:  Diagnosis Date   Allergy    Cat allergy   Bilateral primary osteoarthritis of knee    Coronary atherosclerosis of unspecified type of vessel, native or graft    CABG 07/2003; annual cardiology f/u (Dr. Ollie Bhat).  Stable as of 02/2017 f/u--echo 03/2017 showed normal LV fxn---ok to d/c ACE-I per cardiologist.   COVID-19 virus infection 05/05/2021   paxlovid    HIV infection (HCC) Dx 02/2002   ID clinic w/ [facility].   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 Maria Shiner following.   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    Past Surgical History:  Procedure Laterality Date   biopsy lymph node groin  2022   BYPASS GRAFT  2004   CARDIOVASCULAR STRESS TEST  05/2016   No ischemia   COLONOSCOPY  01/2011   Normal (recall 10 yrs)   CORONARY ARTERY BYPASS GRAFT  2004   HERNIA REPAIR     as infant   INGUINAL HERNIA REPAIR Right 04/09/2013   Procedure: RIGHT HERNIA REPAIR INGUINAL ADULT;  Surgeon: Darcella Earnest, MD;  Location: [facility];  Service: General;  Laterality: Right;   LYMPH NODE BIOPSY Left 06/16/2021   Procedure: EXCISIONAL BIOPSY LEFT INGUINAL LYMPH NODE;  Surgeon: Oza Blumenthal, MD;  Location: [facility];  Service: General;  Laterality: Left;   TONSILLECTOMY     TRANSESOPHAGEAL ECHOCARDIOGRAM  12/2020   normal (mild av thickening)   TRANSTHORACIC ECHOCARDIOGRAM  03/19/2017   Normal     Current Outpatient Medications:  albuterol  (VENTOLIN  HFA) 108 (90 Base) MCG/ACT inhaler, Inhale 1-2 puffs into the lungs every 6 (six) hours as needed for wheezing or shortness of breath., Disp: 1 each, Rfl: 0   aspirin 81 MG chewable tablet, Chew 81 mg by mouth daily., Disp: , Rfl:    atorvastatin  (LIPITOR) 40 MG tablet, Take 1 tablet (40 mg total) by mouth daily., Disp: 90 tablet, Rfl: 3   bictegravir-emtricitabine-tenofovir  AF (BIKTARVY ) 50-200-25 MG TABS tablet, Take 1 tablet by mouth daily., Disp: 90 tablet, Rfl: 1   losartan  (COZAAR ) 25 MG tablet, Take 1  tablet (25 mg total) by mouth daily., Disp: 90 tablet, Rfl: 3   tamsulosin  (FLOMAX ) 0.4 MG CAPS capsule, Take 1 capsule (0.4 mg total) by mouth daily., Disp: 30 capsule, Rfl: 1  EXAM:  VITALS per patient if applicable:     03/13/2024    8:53 AM 01/01/2024    8:21 AM 11/29/2023   10:48 AM  Vitals with BMI  Height 5\' 10"   5' 9.5"  Weight 156 lbs 6 oz 157 lbs 153 lbs 10 oz  BMI 22.44  22.37  Systolic 108 115 161  Diastolic 70 73 71  Pulse 51 47 65     GENERAL: alert, oriented, appears well and in no acute distress  HEENT: atraumatic, conjunttiva clear, no obvious abnormalities on inspection of external nose and ears  NECK: normal movements of the head and neck  LUNGS: on inspection no signs of respiratory distress, breathing rate appears normal, no obvious gross SOB, gasping or wheezing  CV: no obvious cyanosis  MS: moves all visible extremities without noticeable abnormality  PSYCH/NEURO: pleasant and cooperative, no obvious depression or anxiety, speech and thought processing grossly intact  LABS: none today    Chemistry      Component Value Date/Time   NA 138 01/01/2024 0808   K 4.0 01/01/2024 0808   CL 104 01/01/2024 0808   CO2 28 01/01/2024 0808   BUN 23 01/01/2024 0808   CREATININE 1.12 01/01/2024 0808   CREATININE 1.26 08/16/2022 1011      Component Value Date/Time   CALCIUM  9.6 01/01/2024 0808   ALKPHOS 57 01/01/2024 0808   AST 19 01/01/2024 0808   ALT 21 01/01/2024 0808   BILITOT 0.7 01/01/2024 0808      ASSESSMENT AND PLAN:  Discussed the following assessment and plan:  #1 COVID respiratory infection.  This is past the antiviral window.  He seems to be starting to turn the corner.  He will continue over-the-counter cold medicine.  2.  Acute low back pain, persistent x 2 weeks.  No sign of herniated disc at this time. Will get him started with PT. He will try ibuprofen 800 mg twice a day with food as needed for pain   I discussed the assessment  and treatment plan with the patient. The patient was provided an opportunity to ask questions and all were answered. The patient agreed with the plan and demonstrated an understanding of the instructions.   F/u: if not signif improving  Signed:  Arletha Lady, MD           04/02/2024

## 2024-04-03 ENCOUNTER — Other Ambulatory Visit: Payer: Self-pay | Admitting: Internal Medicine

## 2024-04-03 DIAGNOSIS — Z21 Asymptomatic human immunodeficiency virus [HIV] infection status: Secondary | ICD-10-CM

## 2024-04-10 ENCOUNTER — Encounter: Payer: Self-pay | Admitting: Family Medicine

## 2024-04-16 DIAGNOSIS — M545 Low back pain, unspecified: Secondary | ICD-10-CM | POA: Diagnosis not present

## 2024-04-23 NOTE — Progress Notes (Signed)
 The ASCVD Risk score (Arnett DK, et al., 2019) failed to calculate for the following reasons:   The valid total cholesterol range is 130 to 320 mg/dL  Arlon Bergamo, BSN, RN

## 2024-04-28 ENCOUNTER — Other Ambulatory Visit: Payer: Medicare Other

## 2024-04-28 ENCOUNTER — Ambulatory Visit: Payer: Medicare Other | Admitting: Hematology & Oncology

## 2024-04-30 ENCOUNTER — Encounter: Payer: Self-pay | Admitting: Hematology & Oncology

## 2024-04-30 ENCOUNTER — Inpatient Hospital Stay: Attending: Hematology & Oncology

## 2024-04-30 ENCOUNTER — Inpatient Hospital Stay (HOSPITAL_BASED_OUTPATIENT_CLINIC_OR_DEPARTMENT_OTHER): Admitting: Hematology & Oncology

## 2024-04-30 VITALS — BP 116/78 | HR 44 | Temp 98.4°F | Resp 20 | Ht 70.0 in | Wt 153.4 lb

## 2024-04-30 DIAGNOSIS — R590 Localized enlarged lymph nodes: Secondary | ICD-10-CM | POA: Diagnosis not present

## 2024-04-30 DIAGNOSIS — C8800 Waldenstrom macroglobulinemia not having achieved remission: Secondary | ICD-10-CM | POA: Diagnosis not present

## 2024-04-30 DIAGNOSIS — D472 Monoclonal gammopathy: Secondary | ICD-10-CM

## 2024-04-30 LAB — CBC WITH DIFFERENTIAL (CANCER CENTER ONLY)
Abs Immature Granulocytes: 0.01 10*3/uL (ref 0.00–0.07)
Basophils Absolute: 0 10*3/uL (ref 0.0–0.1)
Basophils Relative: 0 %
Eosinophils Absolute: 0.4 10*3/uL (ref 0.0–0.5)
Eosinophils Relative: 5 %
HCT: 39.6 % (ref 39.0–52.0)
Hemoglobin: 13.6 g/dL (ref 13.0–17.0)
Immature Granulocytes: 0 %
Lymphocytes Relative: 40 %
Lymphs Abs: 2.8 10*3/uL (ref 0.7–4.0)
MCH: 30.9 pg (ref 26.0–34.0)
MCHC: 34.3 g/dL (ref 30.0–36.0)
MCV: 90 fL (ref 80.0–100.0)
Monocytes Absolute: 0.8 10*3/uL (ref 0.1–1.0)
Monocytes Relative: 12 %
Neutro Abs: 3 10*3/uL (ref 1.7–7.7)
Neutrophils Relative %: 43 %
Platelet Count: 245 10*3/uL (ref 150–400)
RBC: 4.4 MIL/uL (ref 4.22–5.81)
RDW: 13.4 % (ref 11.5–15.5)
WBC Count: 6.9 10*3/uL (ref 4.0–10.5)
nRBC: 0 % (ref 0.0–0.2)

## 2024-04-30 LAB — CMP (CANCER CENTER ONLY)
ALT: 18 U/L (ref 0–44)
AST: 16 U/L (ref 15–41)
Albumin: 4.3 g/dL (ref 3.5–5.0)
Alkaline Phosphatase: 49 U/L (ref 38–126)
Anion gap: 7 (ref 5–15)
BUN: 24 mg/dL — ABNORMAL HIGH (ref 8–23)
CO2: 27 mmol/L (ref 22–32)
Calcium: 10 mg/dL (ref 8.9–10.3)
Chloride: 103 mmol/L (ref 98–111)
Creatinine: 1.26 mg/dL — ABNORMAL HIGH (ref 0.61–1.24)
GFR, Estimated: >60 mL/min (ref >60)
Glucose, Bld: 102 mg/dL — ABNORMAL HIGH (ref 70–99)
Potassium: 4.5 mmol/L (ref 3.5–5.1)
Sodium: 137 mmol/L (ref 135–145)
Total Bilirubin: 0.6 mg/dL (ref 0.0–1.2)
Total Protein: 7.6 g/dL (ref 6.5–8.1)

## 2024-04-30 LAB — LACTATE DEHYDROGENASE: LDH: 122 U/L (ref 98–192)

## 2024-04-30 NOTE — Progress Notes (Signed)
 Hematology and Oncology Follow Up Visit  Edwin Padilla [id number] 1951/11/07 72 y.o. 04/30/2024   Principle Diagnosis:  IgM smoldering lymphoplasmacytic lymphoma  Current Therapy:   Observation     Interim History:  Edwin Padilla is back for follow-up.  As always, he is busy traveling.  He will be heading off to [place] again.  Actually, I think this weekend, he will be heading out west to see the [place]  [place] Pad[place] as a play some of the [place].  He feels okay.  The year the [place] trip [place] so he can do some hiking.  I am sure that he will enjoy this tremendously.  He really has had no problems with infections.  He has had no problems with nausea or vomiting.  He has had no problem with bleeding.  There is no rashes.  There is no change in bowel or bladder habits.  We are still following the IgM level.  When we last saw him, his monoclonal spike was 1 g/dL.  His IgM level was 2470 mg/dL.  The Kappa light chain was 21.3 mg/dL.  All this seems to be holding relatively stable.  He has not noted any swollen lymph nodes.  Overall, I would say that his performance status is probably ECOG 0.     Medications:  Current Outpatient Medications:    aspirin 81 MG chewable tablet, Chew 81 mg by mouth daily., Disp: , Rfl:    atorvastatin  (LIPITOR) 40 MG tablet, Take 1 tablet (40 mg total) by mouth daily., Disp: 90 tablet, Rfl: 3   BIKTARVY  50-200-25 MG TABS tablet, TAKE 1 TABLET BY MOUTH DAILY, Disp: 30 tablet, Rfl: 5   losartan  (COZAAR ) 25 MG tablet, Take 1 tablet (25 mg total) by mouth daily., Disp: 90 tablet, Rfl: 3   albuterol  (VENTOLIN  HFA) 108 (90 Base) MCG/ACT inhaler, Inhale 1-2 puffs into the lungs every 6 (six) hours as needed for wheezing or shortness of breath. (Patient not taking: Reported on 04/30/2024), Disp: 1 each, Rfl: 0   tamsulosin  (FLOMAX ) 0.4 MG CAPS capsule, Take 1 capsule (0.4 mg total) by mouth daily. (Patient not taking:  Reported on 04/30/2024), Disp: 30 capsule, Rfl: 1  Allergies:  Allergies  Allergen Reactions   Cat Dander Shortness Of Breath   Lisinopril  Cough    cough    Past Medical History, Surgical history, Social history, and Family History were reviewed and updated.  Review of Systems: Review of Systems  Constitutional: Negative.   HENT:  Negative.    Eyes: Negative.   Respiratory: Negative.    Cardiovascular: Negative.   Gastrointestinal: Negative.   Endocrine: Negative.   Genitourinary: Negative.    Musculoskeletal: Negative.   Skin: Negative.   Neurological: Negative.   Hematological: Negative.   Psychiatric/Behavioral: Negative.      Physical Exam: Vital signs show temperature 98.4.  Pulse 44.  Blood pressure 116/78.  Weight is 153 pounds.   Wt Readings from Last 3 Encounters:  04/30/24 153 lb 6.4 oz (69.6 kg)  03/13/24 156 lb 6.4 oz (70.9 kg)  01/01/24 157 lb (71.2 kg)    Physical Exam Vitals reviewed.  HENT:     Head: Normocephalic and atraumatic.  Eyes:     Pupils: Pupils are equal, round, and reactive to light.  Cardiovascular:     Rate and Rhythm: Normal rate and regular rhythm.     Heart sounds: Normal heart sounds.  Pulmonary:  Effort: Pulmonary effort is normal.     Breath sounds: Normal breath sounds.  Abdominal:     General: Bowel sounds are normal.     Palpations: Abdomen is soft.  Musculoskeletal:        General: No tenderness or deformity. Normal range of motion.     Cervical back: Normal range of motion.  Lymphadenopathy:     Cervical: No cervical adenopathy.  Skin:    General: Skin is warm and dry.     Findings: No erythema or rash.  Neurological:     Mental Status: He is alert and oriented to person, place, and time.  Psychiatric:        Behavior: Behavior normal.        Thought Content: Thought content normal.        Judgment: Judgment normal.     Lab Results  Component Value Date   WBC 6.9 04/30/2024   HGB 13.6 04/30/2024   HCT  39.6 04/30/2024   MCV 90.0 04/30/2024   PLT 245 04/30/2024     Chemistry      Component Value Date/Time   NA 137 04/30/2024 1011   K 4.5 04/30/2024 1011   CL 103 04/30/2024 1011   CO2 27 04/30/2024 1011   BUN 24 (H) 04/30/2024 1011   CREATININE 1.26 (H) 04/30/2024 1011   CREATININE 1.26 08/16/2022 1011      Component Value Date/Time   CALCIUM  10.0 04/30/2024 1011   ALKPHOS 49 04/30/2024 1011   AST 16 04/30/2024 1011   ALT 18 04/30/2024 1011   BILITOT 0.6 04/30/2024 1011     Impression and Plan: Edwin Padilla is a very nice 73 year old white male.  He has a lymphoplasmacytic lymphoma.  So far, has been totally asymptomatic.  Even though the protein numbers have been trending upward slowly, I still think we have the luxury of been able to hold off on doing anything for this.  Again he is asymptomatic.  He is quite active.  I do not think that we need to interfere with his quality of life right now.  I would like to get him back to see us  in another 5 months or so.  We can get him through the Summertime.  I would like to see him back in the Fall.    Ivor Mars, MD 5/21/202510:53 AM

## 2024-05-01 LAB — KAPPA/LAMBDA LIGHT CHAINS
Kappa free light chain: 238.3 mg/L — ABNORMAL HIGH (ref 3.3–19.4)
Kappa, lambda light chain ratio: 56.74 — ABNORMAL HIGH (ref 0.26–1.65)
Lambda free light chains: 4.2 mg/L — ABNORMAL LOW (ref 5.7–26.3)

## 2024-05-01 LAB — IGG, IGA, IGM
IgA: 53 mg/dL — ABNORMAL LOW (ref 61–437)
IgG (Immunoglobin G), Serum: 474 mg/dL — ABNORMAL LOW (ref 603–1613)
IgM (Immunoglobulin M), Srm: 3986 mg/dL — ABNORMAL HIGH (ref 15–143)

## 2024-05-02 DIAGNOSIS — Z23 Encounter for immunization: Secondary | ICD-10-CM | POA: Diagnosis not present

## 2024-05-06 LAB — PROTEIN ELECTROPHORESIS, SERUM, WITH REFLEX
A/G Ratio: 0.9 (ref 0.7–1.7)
Albumin ELP: 3.4 g/dL (ref 2.9–4.4)
Alpha-1-Globulin: 0.2 g/dL (ref 0.0–0.4)
Alpha-2-Globulin: 0.6 g/dL (ref 0.4–1.0)
Beta Globulin: 0.7 g/dL (ref 0.7–1.3)
Gamma Globulin: 2.4 g/dL — ABNORMAL HIGH (ref 0.4–1.8)
Globulin, Total: 3.9 g/dL (ref 2.2–3.9)
M-Spike, %: 1.2 g/dL — ABNORMAL HIGH
SPEP Interpretation: 0
Total Protein ELP: 7.3 g/dL (ref 6.0–8.5)

## 2024-05-06 LAB — IMMUNOFIXATION REFLEX, SERUM
IgA: 55 mg/dL — ABNORMAL LOW (ref 61–437)
IgG (Immunoglobin G), Serum: 512 mg/dL — ABNORMAL LOW (ref 603–1613)
IgM (Immunoglobulin M), Srm: 2900 mg/dL — ABNORMAL HIGH (ref 15–143)

## 2024-05-14 ENCOUNTER — Other Ambulatory Visit: Payer: Self-pay | Admitting: Cardiology

## 2024-05-19 ENCOUNTER — Encounter: Payer: Self-pay | Admitting: Family Medicine

## 2024-05-19 ENCOUNTER — Encounter: Payer: Self-pay | Admitting: Cardiology

## 2024-05-22 ENCOUNTER — Ambulatory Visit (HOSPITAL_COMMUNITY)
Admission: EM | Admit: 2024-05-22 | Discharge: 2024-05-22 | Disposition: A | Discharge status: Home / Self Care | Attending: Internal Medicine | Admitting: Internal Medicine

## 2024-05-22 ENCOUNTER — Ambulatory Visit (INDEPENDENT_AMBULATORY_CARE_PROVIDER_SITE_OTHER)

## 2024-05-22 ENCOUNTER — Encounter (HOSPITAL_COMMUNITY): Payer: Self-pay

## 2024-05-22 DIAGNOSIS — M25532 Pain in left wrist: Secondary | ICD-10-CM | POA: Diagnosis not present

## 2024-05-22 NOTE — ED Triage Notes (Signed)
 Pt states tripped and fell on the sidewalk yesterday morning and hurt his lt wrist. States took ASA today with relief.

## 2024-05-22 NOTE — ED Provider Notes (Signed)
 [facility]    CSN: [id number] Arrival date & time: 05/22/24  1403      History   Chief Complaint Chief Complaint  Patient presents with   Wrist Pain    HPI Edwin Padilla is a 73 y.o. male.   73 year old male who presents urgent care with complaints of left wrist pain.  This happened after a fall yesterday.  He reports that he fell down onto 1 knee and caught himself with his left hand.  He has had bruising and swelling along the lateral wrist into the thenar aspect of the thumb.  He is still able to use his hand but reports that it gets sore after time.   Wrist Pain Pertinent negatives include no chest pain, no abdominal pain and no shortness of breath.    Past Medical History:  Diagnosis Date   Allergy    Cat allergy   Bilateral primary osteoarthritis of knee    Coronary atherosclerosis of unspecified type of vessel, native or graft    CABG 07/2003; annual cardiology f/u (Dr. Ollie Bhat).  Stable as of 02/2017 f/u--echo 03/2017 showed normal LV fxn---ok to d/c ACE-I per cardiologist.   COVID-19 virus infection 05/05/2021   paxlovid    HIV infection (HCC) Dx 02/2002   ID clinic w/ [facility].   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 Maria Shiner following.   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   Incarceration 08/28/2011    Past Surgical History:  Procedure Laterality Date   biopsy lymph node groin  2022   BYPASS GRAFT  2004   CARDIOVASCULAR STRESS TEST  05/2016   No ischemia   COLONOSCOPY  01/2011   Normal (recall 10 yrs)   CORONARY ARTERY BYPASS GRAFT  2004   HERNIA REPAIR     as infant   INGUINAL HERNIA REPAIR Right 04/09/2013   Procedure: RIGHT HERNIA REPAIR INGUINAL ADULT;  Surgeon: Darcella Earnest, MD;  Location: [facility];  Service: General;  Laterality: Right;   LYMPH NODE BIOPSY Left 06/16/2021   Procedure: EXCISIONAL BIOPSY LEFT INGUINAL LYMPH NODE;  Surgeon: Oza Blumenthal, MD;  Location: [facility];  Service: General;  Laterality: Left;   TONSILLECTOMY     TRANSESOPHAGEAL ECHOCARDIOGRAM  12/2020   normal (mild av thickening)   TRANSTHORACIC ECHOCARDIOGRAM  03/19/2017   Normal       Home Medications  Prior to Admission medications   Medication Sig Start Date End Date Taking? Authorizing Provider  albuterol  (VENTOLIN  HFA) 108 (90 Base) MCG/ACT inhaler Inhale 1-2 puffs into the lungs every 6 (six) hours as needed for wheezing or shortness of breath. Patient not taking: Reported on 04/30/2024 07/23/23   McGowen, Philip H, MD  aspirin 81 MG chewable tablet Chew 81 mg by mouth daily.    [provider]  atorvastatin  (LIPITOR) 40 MG tablet TAKE 1 TABLET EVERY DAY 05/15/24   Hugh Madura, MD  BIKTARVY  50-200-25 MG TABS tablet TAKE 1 TABLET BY MOUTH DAILY 04/03/24   Vu,  Trung T, MD  losartan  (COZAAR ) 25 MG tablet TAKE 1 TABLET EVERY DAY 05/15/24   Hugh Madura, MD  tamsulosin  (FLOMAX ) 0.4 MG CAPS capsule Take 1 capsule (0.4 mg total) by mouth daily. Patient not taking: Reported on 04/30/2024 01/02/24   Shelvia Dick, MD    Family History Family History  Problem Relation Age of Onset   Stroke Mother 42   Heart failure Father    Heart disease Father    Colon cancer Father 57   Cancer Brother 25       lymphoma   Colon polyps Neg Hx    Esophageal cancer Neg Hx    Rectal cancer Neg Hx    Stomach cancer Neg Hx     Social History Social History   Tobacco Use   Smoking status: Former    Current packs/day: 0.00    Types: Cigarettes    Start date: 12/11/1968    Quit date: 12/12/2003    Years since quitting: 20.4   Smokeless tobacco: Never  Vaping Use   Vaping status: Never Used  Substance Use Topics   Alcohol use: Never    Comment: 2005   Drug use: No    Comment: past history of crystal meth addiction -clean since 2005      Allergies   Cat dander and Lisinopril    Review of Systems Review of Systems  Constitutional:  Negative for chills and fever.  HENT:  Negative for ear pain and sore throat.   Eyes:  Negative for pain and visual disturbance.  Respiratory:  Negative for cough and shortness of breath.   Cardiovascular:  Negative for chest pain and palpitations.  Gastrointestinal:  Negative for abdominal pain and vomiting.  Genitourinary:  Negative for dysuria and hematuria.  Musculoskeletal:  Negative for arthralgias and back pain.       Left wrist pain, swelling and bruising.   Skin:  Negative for color change and rash.  Neurological:  Negative for seizures and syncope.  All other systems reviewed and are negative.    Physical Exam Triage Vital Signs ED Triage Vitals [05/22/24 1423]  Encounter Vitals Group     BP 110/75     Girls Systolic BP Percentile      Girls Diastolic BP Percentile      Boys Systolic BP Percentile       Boys Diastolic BP Percentile      Pulse Rate (!) 53     Resp 18     Temp 98.4 F (36.9 C)     Temp Source Oral     SpO2 94 %     Weight      Height      Head Circumference      Peak Flow      Pain Score 0     Pain Loc      Pain  Education      Exclude from Growth Chart    No data found.  Updated Vital Signs BP 110/75 (BP Location: Right Arm)   Pulse (!) 53   Temp 98.4 F (36.9 C) (Oral)   Resp 18   SpO2 94%   Visual Acuity Right Eye Distance:   Left Eye Distance:   Bilateral Distance:    Right Eye Near:   Left Eye Near:    Bilateral Near:     Physical Exam Vitals and nursing note reviewed.  Constitutional:      General: He is not in acute distress.    Appearance: He is well-developed.  HENT:     Head: Normocephalic and atraumatic.   Eyes:     Conjunctiva/sclera: Conjunctivae normal.    Cardiovascular:     Rate and Rhythm: Normal rate and regular rhythm.     Heart sounds: No murmur heard. Pulmonary:     Effort: Pulmonary effort is normal. No respiratory distress.     Breath sounds: Normal breath sounds.  Abdominal:     Palpations: Abdomen is soft.     Tenderness: There is no abdominal tenderness.   Musculoskeletal:        General: No swelling.     Left wrist: Swelling (radial side) present. No lacerations, tenderness, bony tenderness or snuff box tenderness. Normal range of motion. Normal pulse.     Left hand: Swelling (thenar aspect) present. No tenderness. Normal range of motion. Normal strength. Normal sensation. Normal capillary refill. Normal pulse.     Cervical back: Neck supple.     Comments: Bruising and swelling present along the radial aspect of the wrist and the thenar aspect   Skin:    General: Skin is warm and dry.     Capillary Refill: Capillary refill takes less than 2 seconds.   Neurological:     Mental Status: He is alert.   Psychiatric:        Mood and Affect: Mood normal.      UC Treatments / Results  Labs (all labs  ordered are listed, but only abnormal results are displayed) Labs Reviewed - No data to display  EKG   Radiology DG Wrist Complete Left Result Date: 05/22/2024 CLINICAL DATA:  Fall and trauma to the left wrist. EXAM: LEFT WRIST - COMPLETE 3+ VIEW COMPARISON:  None Available. FINDINGS: No acute fracture or dislocation. Mild osteopenia. Mild soft tissue swelling of the wrist. No radiopaque foreign object or soft tissue gas. IMPRESSION: 1. No acute fracture or dislocation. 2. Mild soft tissue swelling. Electronically Signed   By: Angus Bark M.D.   On: 05/22/2024 15:08    Procedures Procedures (including critical care time)  Medications Ordered in UC Medications - No data to display  Initial Impression / Assessment and Plan / UC Course  I have reviewed the triage vital signs and the nursing notes.  Pertinent labs & imaging results that were available during my care of the patient were reviewed by me and considered in my medical decision making (see chart for details).     Left wrist pain - Plan: DG Wrist Complete Left, DG Wrist Complete Left   X-ray done today.  Final evaluation shows no evidence of acute fracture.  There is some swelling and bruising in the area. Recommend Icing the area 2-3 times daily for 10-15 minutes to help with pain and swelling. Do not apply ice directly to the skin.  Can alternate Tylenol  and ibuprofen for pain.  Activity  as tolerated. Return to urgent care or PCP if symptoms worsen or fail to resolve.    Final Clinical Impressions(s) / UC Diagnoses   Final diagnoses:  Left wrist pain     Discharge Instructions      X-ray done today.  Final evaluation shows no evidence of acute fracture.  There is some swelling and bruising in the area. Recommend Icing the area 2-3 times daily for 10-15 minutes to help with pain and swelling. Do not apply ice directly to the skin.  Can alternate Tylenol  and ibuprofen for pain.  Activity as tolerated. Return to urgent  care or PCP if symptoms worsen or fail to resolve.       ED Prescriptions   None    PDMP not reviewed this encounter.   Kreg Pesa, [place] 05/22/24 1516

## 2024-05-22 NOTE — Discharge Instructions (Addendum)
 X-ray done today.  Final evaluation shows no evidence of acute fracture.  There is some swelling and bruising in the area. Recommend Icing the area 2-3 times daily for 10-15 minutes to help with pain and swelling. Do not apply ice directly to the skin.  Can alternate Tylenol  and ibuprofen for pain.  Activity as tolerated. Return to urgent care or PCP if symptoms worsen or fail to resolve.

## 2024-05-23 ENCOUNTER — Telehealth: Payer: Self-pay | Admitting: Cardiology

## 2024-05-23 ENCOUNTER — Ambulatory Visit (HOSPITAL_COMMUNITY)

## 2024-05-23 NOTE — Telephone Encounter (Signed)
 Pt c/o medication issue:  1. Name of Medication: atorvastatin  (LIPITOR) 40 MG tablet   2. How are you currently taking this medication (dosage and times per day)? N/A  3. Are you having a reaction (difficulty breathing--STAT)? No   4. What is your medication issue? Pharmacists calling to make sure this medication could be filled due to the pt being allergic to Lipitor. Order #: [id number]

## 2024-05-23 NOTE — Telephone Encounter (Signed)
 We do not have on file that patient is allergic to Atorvastatin  and pt has been taking it per records. Called and spoke with patient who verbalized great distaste with [facility] and that he is in fact NOT allergic to it, only Lisinopril . Called [facility] back and spoke with Trevor Fudge (pharm tech) and Autry Legions (pharmacist) who updated their records there and will send medication out.

## 2024-05-29 ENCOUNTER — Ambulatory Visit (INDEPENDENT_AMBULATORY_CARE_PROVIDER_SITE_OTHER): Payer: Medicare Other | Admitting: Family Medicine

## 2024-05-29 ENCOUNTER — Encounter: Payer: Self-pay | Admitting: Family Medicine

## 2024-05-29 VITALS — Ht 70.0 in | Wt 153.0 lb

## 2024-05-29 DIAGNOSIS — M25532 Pain in left wrist: Secondary | ICD-10-CM | POA: Diagnosis not present

## 2024-05-29 DIAGNOSIS — R7303 Prediabetes: Secondary | ICD-10-CM

## 2024-05-29 DIAGNOSIS — Z23 Encounter for immunization: Secondary | ICD-10-CM | POA: Diagnosis not present

## 2024-05-29 DIAGNOSIS — E7800 Pure hypercholesterolemia, unspecified: Secondary | ICD-10-CM

## 2024-05-29 DIAGNOSIS — I10 Essential (primary) hypertension: Secondary | ICD-10-CM

## 2024-05-29 MED ORDER — TETANUS-DIPHTH-ACELL PERTUSSIS 5-2.5-18.5 LF-MCG/0.5 IM SUSP: 0.5000 mL | Freq: Once | INTRAMUSCULAR | 0 refills | Status: AC

## 2024-05-29 NOTE — Progress Notes (Signed)
 OFFICE VISIT  05/29/2024  CC:  Chief Complaint  Patient presents with   Medical Management of Chronic Issues    Pt is not fasting    Patient is a 73 y.o. male who presents for 27-month follow-up hypertension, BPH, and hyperlipidemia. A/P as of last visit: #1 hypertension, well-controlled on losartan  25 mg a day. Electrolytes and creatinine normal.   2.  Hypercholesterolemia, doing well on Lipitor 40 mg a day. Lipid panel at goal.   3.  Acute bronchitis, resolving gradually.  No new medications at this time.   4.  UTI: Symptoms resolved with antibiotics. He does have a history of some mild BPH symptoms that fluctuate in severity.  He says they are currently at a level he can certainly live with.   #5 preventative health care: Vaccines:  Tdap->rx sent to pharm.  Shingrix-->UTD per pt (approx 2019).  O/W utd. Normal colonoscopy 2012.  Due for repeat.  He wants to defer at this time. Prostate cancer screening: PSA mildly elevated on 11/19/2023 in the setting of UTI. He has follow-up with his oncologist on 12/24/2022 and he will try to get a repeat done at that visit --> this would be the best interval of time to reassess.  INTERIM HX: He feels like he is doing okay currently. He did tripped over something and fell on outstretched left hand/wrist 9 days ago.  He went to the urgent care and an x-ray showed no fracture.  He says it still hurts a moderate amount.  Not wearing a splint.  He saw the urologist since I saw him last.  He got a CT abdomen and pelvis for hematuria workup and it did not show any findings to account for his hematuria.  However it did show mild left lateral bladder wall thickening possible related to chronic bladder outlet obstruction.  He got a cystoscopy after this and was told all was normal. He has some mild lower urinary tract obstructive voiding symptoms but he feels like he is doing okay.  He never started the tamsulosin .  He has some waxing and waning low  back pain that he seems to be handling okay at this time.  He recalls PT in the recent past making things worse.  He is still running daily without problem.  CBC normal and complete metabolic panel normal at hematology/oncology follow-up 04/30/2024. 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  ROS as above, plus--> no fevers, no CP, no SOB, no wheezing, no cough, no dizziness, no HAs, no rashes, no melena/hematochezia.  No polyuria or polydipsia.  No myalgias or arthralgias.  No focal weakness, paresthesias, or tremors.  No acute vision or hearing abnormalities.  No dysuria No recent changes in lower legs. No n/v/d or abd pain.  No palpitations.    Past Medical History:  Diagnosis Date   Allergy    Cat allergy   Bilateral primary osteoarthritis of knee    Coronary atherosclerosis of unspecified type of vessel, native or graft    CABG 07/2003; annual cardiology f/u (Dr. Ollie Bhat).  Stable as of 02/2017 f/u--echo 03/2017 showed normal LV fxn---ok to d/c ACE-I per cardiologist.   COVID-19 virus infection 05/05/2021   paxlovid    HIV infection (HCC) Dx 02/2002   ID clinic w/ [facility].   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 Maria Shiner following.   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    Past Surgical History:  Procedure Laterality Date   biopsy lymph node groin  2022    BYPASS GRAFT  2004   CARDIOVASCULAR STRESS TEST  05/2016   No ischemia   COLONOSCOPY  01/2011   Normal (recall 10 yrs)   CORONARY ARTERY BYPASS GRAFT  2004   HERNIA REPAIR     as infant   INGUINAL HERNIA REPAIR Right 04/09/2013   Procedure: RIGHT HERNIA REPAIR INGUINAL ADULT;  Surgeon: Darcella Earnest, MD;  Location: [facility];  Service: General;  Laterality: Right;   LYMPH NODE BIOPSY Left 06/16/2021   Procedure: EXCISIONAL BIOPSY LEFT INGUINAL LYMPH NODE;  Surgeon: Oza Blumenthal, MD;  Location: [facility];  Service: General;  Laterality: Left;   TONSILLECTOMY     TRANSESOPHAGEAL ECHOCARDIOGRAM  12/2020   normal (mild av thickening)   TRANSTHORACIC ECHOCARDIOGRAM  03/19/2017   Normal    Outpatient Medications Prior to Visit  Medication Sig Dispense Refill   albuterol  (VENTOLIN  HFA) 108 (90 Base) MCG/ACT inhaler Inhale 1-2 puffs into the lungs every 6 (six) hours as needed for wheezing or shortness of breath. 1 each 0   aspirin 81 MG chewable tablet Chew 81 mg by mouth daily.     atorvastatin  (LIPITOR) 40 MG tablet TAKE 1 TABLET EVERY DAY 90 tablet 3   BIKTARVY  50-200-25 MG TABS tablet TAKE 1 TABLET BY MOUTH DAILY 30 tablet 5   losartan  (COZAAR ) 25 MG tablet TAKE 1 TABLET EVERY DAY 90 tablet 3   tamsulosin  (FLOMAX ) 0.4 MG CAPS capsule Take 1 capsule (0.4 mg total) by mouth daily. (Patient not taking: Reported on 05/29/2024) 30 capsule 1   No facility-administered medications prior to visit.    Allergies  Allergen Reactions   Cat Dander Shortness Of Breath   Lisinopril  Cough    cough    Review of Systems As per HPI  PE:    05/29/2024   10:24 AM 05/22/2024    2:23 PM 04/30/2024   10:36 AM  Vitals with BMI  Height 5' 10    Weight 153 lbs    BMI 21.95    Systolic  110   Diastolic  75   Pulse  53 44     Physical Exam  General: Alert and well-appearing. Affect is pleasant, thought and speech are lucid. Left wrist shows some  faint ecchymoses on the radial aspect of the wrist and the thenar eminence.  He has a little bit of tenderness of the radial aspect of the wrist proximal to the anatomic snuffbox.  Range of motion of the thumb and wrist are fully intact, strength normal, sensation normal. Specifically, no tenderness over the anatomic snuffbox.  LABS:  Last CBC Lab Results  Component Value Date   WBC 6.9 04/30/2024   HGB 13.6 04/30/2024   HCT 39.6 04/30/2024   MCV 90.0 04/30/2024   MCH 30.9 04/30/2024   RDW 13.4 04/30/2024   PLT 245 04/30/2024   Last  metabolic panel Lab Results  Component Value Date   GLUCOSE 102 (H) 04/30/2024   NA 137 04/30/2024   K 4.5 04/30/2024   CL 103 04/30/2024   CO2 27 04/30/2024   BUN 24 (H) 04/30/2024   CREATININE 1.26 (H) 04/30/2024   GFRNONAA >60 04/30/2024   CALCIUM  10.0 04/30/2024   PROT 7.6 04/30/2024   ALBUMIN 4.3 04/30/2024   LABGLOB 3.9 04/30/2024   AGRATIO 0.9 04/30/2024   BILITOT 0.6 04/30/2024   ALKPHOS 49 04/30/2024   AST 16 04/30/2024   ALT 18 04/30/2024   ANIONGAP 7 04/30/2024   Last lipids Lab Results  Component Value Date   CHOL 125 11/19/2023   HDL 31.30 (L) 11/19/2023   LDLCALC 77 11/19/2023   TRIG 83.0 11/19/2023   CHOLHDL 4 11/19/2023   Last hemoglobin A1c Lab Results  Component Value Date   HGBA1C 6.0 11/19/2023   Last thyroid  functions Lab Results  Component Value Date   TSH 1.90 11/19/2023   Lab Results  Component Value Date   PSA1 0.8 01/01/2024   PSA 4.67 (H) 11/19/2023   PSA 0.83 08/25/2021   PSA 0.38 08/25/2020   Lab Results  Component Value Date   TESTOSTERONE  446.78 11/19/2023   IMPRESSION AND PLAN:  #1 acute left wrist sprain. Low suspicion of scaphoid fracture.  However, I do think it is best for him to wear a splint so I dispensed a thumb spica splint today. If still hurting significantly in the next couple of weeks then we will re repeat left wrist x-ray.  #2 hypertension, well-controlled on losartan   25 mg a day. Electrolytes and creatinine were normal 04/30/2024.  #3 hypercholesterolemia, doing well on a atorvastatin  40 mg a day. LDL was 77 about 6 months ago.  Will repeat lipid panel in 6 months if cardiology has not done so first.  4.  BPH, mild lower urinary tract obstructive symptoms. He feels like he is doing fine and chooses no medications at this time.  #5 prediabetes. Hemoglobin A1c 6.0% about 6 months ago.  Glucose was 102 on labs 04/30/2024. Plan repeat A1c 6 months.  An After Visit Summary was printed and given to the patient.  FOLLOW UP: Return in about 6 months (around 11/28/2024) for routine chronic illness f/u.  Signed:  Arletha Lady, MD           05/29/2024

## 2024-06-02 ENCOUNTER — Encounter: Payer: Self-pay | Admitting: Family Medicine

## 2024-06-02 DIAGNOSIS — M545 Low back pain, unspecified: Secondary | ICD-10-CM

## 2024-06-02 NOTE — Telephone Encounter (Signed)
 Will order back x-ray ([facility]) and orthopedics referral ([facility]).

## 2024-06-04 DIAGNOSIS — M545 Low back pain, unspecified: Secondary | ICD-10-CM | POA: Diagnosis not present

## 2024-06-11 ENCOUNTER — Ambulatory Visit
Admission: RE | Admit: 2024-06-11 | Discharge: 2024-06-11 | Disposition: A | Discharge status: Home / Self Care | Source: Ambulatory Visit | Attending: Family Medicine | Admitting: Family Medicine

## 2024-06-11 ENCOUNTER — Ambulatory Visit: Admitting: Family Medicine

## 2024-06-11 VITALS — BP 117/93 | Ht 70.0 in | Wt 149.0 lb

## 2024-06-11 DIAGNOSIS — M25532 Pain in left wrist: Secondary | ICD-10-CM

## 2024-06-11 DIAGNOSIS — M79605 Pain in left leg: Secondary | ICD-10-CM | POA: Diagnosis not present

## 2024-06-11 NOTE — Progress Notes (Signed)
 PCP: Candise Aleene DEL, MD  Subjective:   HPI: Patient is a 73 y.o. male here for left leg pain.  Patient is an avid runner. Runs about 3 miles 4-5 times a week. About 2-3 months ago when he injured his back and felt increase in left shin pain at same time. Had x-rays of his back. No acute injury or trauma. He recalls adding an extra mile to his runs at that time though. No swelling/bruising. Also reports FOOSH injury to his left wrist 3 weeks ago with pain on radial side - still hurts in this area  Past Medical History:  Diagnosis Date   Allergy    Cat allergy   Bilateral primary osteoarthritis of knee    Coronary atherosclerosis of unspecified type of vessel, native or graft    CABG 07/2003; annual cardiology f/u (Dr. Esmeralda Sharps).  Stable as of 02/2017 f/u--echo 03/2017 showed normal LV fxn---ok to d/c ACE-I per cardiologist.   COVID-19 virus infection 05/05/2021   paxlovid    HIV infection (HCC) Dx 02/2002   ID clinic w/ [facility].   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 Timmy following.   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    Current Outpatient Medications on File Prior to Visit  Medication Sig Dispense Refill   albuterol  (VENTOLIN  HFA) 108 (90 Base) MCG/ACT inhaler Inhale 1-2 puffs into the lungs every 6 (six) hours as needed for wheezing or shortness of breath. 1 each 0   aspirin 81 MG chewable tablet Chew 81 mg by mouth  daily.     atorvastatin  (LIPITOR) 40 MG tablet TAKE 1 TABLET EVERY DAY 90 tablet 3   BIKTARVY  50-200-25 MG TABS tablet TAKE 1 TABLET BY MOUTH DAILY 30 tablet 5   losartan  (COZAAR ) 25 MG tablet TAKE 1 TABLET EVERY DAY 90 tablet 3   tamsulosin  (FLOMAX ) 0.4 MG CAPS capsule Take 1 capsule (0.4 mg total) by mouth daily. (Patient not taking: Reported on 05/29/2024) 30 capsule 1   No current facility-administered medications on file prior to visit.    Past Surgical History:  Procedure Laterality Date   biopsy lymph node groin  2022   BYPASS GRAFT  2004   CARDIOVASCULAR STRESS TEST  05/2016   No ischemia   COLONOSCOPY  01/2011   Normal (recall 10 yrs)   CORONARY ARTERY BYPASS GRAFT  2004   HERNIA REPAIR     as infant   INGUINAL HERNIA REPAIR Right 04/09/2013   Procedure: RIGHT HERNIA REPAIR INGUINAL ADULT;  Surgeon: Sherlean JINNY Laughter, MD;  Location: [facility];  Service: General;  Laterality: Right;   LYMPH NODE BIOPSY Left 06/16/2021   Procedure: EXCISIONAL BIOPSY LEFT INGUINAL LYMPH NODE;  Surgeon: Vernetta Berg, MD;  Location: [facility];  Service: General;  Laterality: Left;   TONSILLECTOMY     TRANSESOPHAGEAL ECHOCARDIOGRAM  12/2020   normal (mild av thickening)   TRANSTHORACIC ECHOCARDIOGRAM  03/19/2017  Normal    Allergies  Allergen Reactions   Cat Dander Shortness Of Breath   Lisinopril  Cough    cough    BP (!) 117/93   Ht 5' 10 (1.778 m)   Wt 149 lb (67.6 kg)   BMI 21.38 kg/m       No data to display              No data to display              Objective:  Physical Exam:  Gen: NAD, comfortable in exam room  Left leg: No deformity, swelling, bruising.SABRA FROM with 5/5 strength - pain on external rotation of ankle. No tenderness to palpation. NVI distally.  Left wrist: No swelling, bruising, deformity. Mild tenderness snuffbox. FROM wrist and digits. NVI distally.   Limited MSK u/s left leg:  No cortical  irregularity, edema, or neovascularity of fibula or tibia.  Assessment & Plan:  1. Left leg pain - consistent with peroneal overuse strain, atypical shin splints.  Home exercises and stretches reviewed.  Icing, activities as tolerated, otc medications as needed.  2. Left wrist injury - 3 weeks out with some tenderness over scaphoid.  He reports initial radiographs were negative - will repeat today to assess, ensure this is just a contusion and sprain.

## 2024-06-11 NOTE — Patient Instructions (Signed)
 Get x-rays of your wrist after you leave today to ensure you don't have a scaphoid fracture. For your low back do home exercises daily if possible (planks, cobra, knee to chest). Your ultrasound and exam of your shin are reassuring - no evidence of a stress fracture. Continue to run as we discussed as long as pain isn't exceeding a 3/10 level of pain. Ice 15 minutes after exercise. Do peroneal strengthening most days of the week. Follow up with me in 1 month.

## 2024-06-12 ENCOUNTER — Ambulatory Visit: Payer: Self-pay | Admitting: Family Medicine

## 2024-06-16 ENCOUNTER — Encounter: Payer: Self-pay | Admitting: Urology

## 2024-06-27 ENCOUNTER — Ambulatory Visit

## 2024-06-27 VITALS — Ht 70.0 in | Wt 149.0 lb

## 2024-06-27 DIAGNOSIS — Z0000 Encounter for general adult medical examination without abnormal findings: Secondary | ICD-10-CM

## 2024-06-27 NOTE — Patient Instructions (Addendum)
 Mr. Foiles , Thank you for taking time out of your busy schedule to complete your Annual Wellness Visit with me. I enjoyed our conversation and look forward to speaking with you again next year. I, as well as your care team,  appreciate your ongoing commitment to your health goals. Please review the following plan we discussed and let me know if I can assist you in the future. Your Game plan/ To Do List    Referrals: If you haven't heard from the office you've been referred to, please reach out to them at the phone provided.  [facility] Located in: [facility] [address] Address: [address], [place], [place] [address] Phone: [phone] Hours:  Open ? Closes 5?PM  Follow up Visits: Next Medicare AWV with our clinical staff: N/A   Have you seen your provider in the last 6 months (3 months if uncontrolled diabetes)? Yes Next Office Visit with your provider: Patient stated that he would call the office to schedule his next office visit.  Last one on 05/29/2024.  Clinician Recommendations:  Aim for 30 minutes of exercise or brisk walking, 6-8 glasses of water, and 5 servings of fruits and vegetables each day. Remember to discuss the Hep B series with your provider during your next office visit.      This is a list of the screening recommended for you and due dates:  Health Maintenance  Topic Date Due   Hepatitis B Vaccine (1 of 3 - Risk 3-dose series) Never done   Zoster (Shingles) Vaccine (2 of 2) 11/11/2017   DTaP/Tdap/Td vaccine (2 - Td or Tdap) 09/07/2020   Colon Cancer Screening  01/19/2021   Medicare Annual Wellness Visit  06/12/2024   Flu Shot  07/11/2024   COVID-19 Vaccine (10 - Pfizer risk 2024-25 season) 11/02/2024   Pneumococcal Vaccine for age over 15  Completed   Hepatitis C Screening  Completed   HPV Vaccine  Aged Out   Meningitis B Vaccine  Aged Out    Advanced directives: (Copy Requested) Please bring a copy of  your health care power of attorney and living will to the office to be added to your chart at your convenience. You can mail to [facility] [address] [place], [place] [address] or email to [email or web address] Advance Care Planning is important because it:  [x]  Makes sure you receive the medical care that is consistent with your values, goals, and preferences  [x]  It provides guidance to your family and loved ones and reduces their decisional burden about whether or not they are making the right decisions based on your wishes.  Follow the link provided in your after visit summary or read over the paperwork we have mailed to you to help you started getting your Advance Directives in place. If you need assistance in completing these, please reach out to us  so that we can help you!  See attachments for Preventive Care and Fall Prevention Tips.

## 2024-06-27 NOTE — Progress Notes (Signed)
 Subjective:   Edwin Padilla is a 73 y.o. who presents for a Medicare Wellness preventive visit.  As a reminder, Annual Wellness Visits don't include a physical exam, and some assessments may be limited, especially if this visit is performed virtually. We may recommend an in-person follow-up visit with your provider if needed.  Visit Complete: Virtual I connected with  Edwin Padilla on 06/27/24 by a audio enabled telemedicine application and verified that I am speaking with the correct person using two identifiers.  Patient Location: Home  Provider Location: Office/Clinic  I discussed the limitations of evaluation and management by telemedicine. The patient expressed understanding and agreed to proceed.  Vital Signs: Because this visit was a virtual/telehealth visit, some criteria may be missing or patient reported. Any vitals not documented were not able to be obtained and vitals that have been documented are patient reported.  VideoDeclined- This patient declined Librarian, academic. Therefore the visit was completed with audio only.  Persons Participating in Visit: Patient.  AWV Questionnaire: No: Patient Medicare AWV questionnaire was not completed prior to this visit.  Cardiac Risk Factors include: advanced age (>36men, >58 women);male gender;Other (see comment);dyslipidemia, Risk factor comments: CAD. HIV     Objective:    Today's Vitals   06/27/24 1510  Weight: 149 lb (67.6 kg)  Height: 5' 10 (1.778 m)   Body mass index is 21.38 kg/m.     06/27/2024    3:19 PM 04/30/2024   10:28 AM 09/04/2023   11:06 AM 06/13/2023    9:35 AM 05/14/2023    8:57 AM 12/25/2022    9:36 AM 08/28/2022    9:25 AM  Advanced Directives  Does Patient Have a Medical Advance Directive? Yes Yes Yes Yes Yes Yes Yes  Type of Estate agent of [place];Living will Healthcare Power of [place];Living will Healthcare Power of [place];Living will Healthcare  Power of [place];Living will Healthcare Power of [place];Living will Healthcare Power of [place];Living will Living will;Healthcare Power of Attorney  Does patient want to make changes to medical advance directive?   No - Patient declined   No - Patient declined No - Patient declined  Copy of Healthcare Power of Attorney in Chart? No - copy requested  No - copy requested No - copy requested No - copy requested No - copy requested     Current Medications (verified) Outpatient Encounter Medications as of 06/27/2024  Medication Sig   albuterol  (VENTOLIN  HFA) 108 (90 Base) MCG/ACT inhaler Inhale 1-2 puffs into the lungs every 6 (six) hours as needed for wheezing or shortness of breath.   aspirin 81 MG chewable tablet Chew 81 mg by mouth daily.   atorvastatin  (LIPITOR) 40 MG tablet TAKE 1 TABLET EVERY DAY   BIKTARVY  50-200-25 MG TABS tablet TAKE 1 TABLET BY MOUTH DAILY   losartan  (COZAAR ) 25 MG tablet TAKE 1 TABLET EVERY DAY   tamsulosin  (FLOMAX ) 0.4 MG CAPS capsule Take 1 capsule (0.4 mg total) by mouth daily. (Patient not taking: Reported on 06/27/2024)   No facility-administered encounter medications on file as of 06/27/2024.    Allergies (verified) Cat dander and Lisinopril    History: Past Medical History:  Diagnosis Date   Allergy    Cat allergy   Bilateral primary osteoarthritis of knee    Coronary atherosclerosis of unspecified type of vessel, native or graft    CABG 07/2003; annual cardiology f/u (Dr. Esmeralda Sharps).  Stable as of 02/2017 f/u--echo 03/2017 showed normal LV fxn---ok to  d/c ACE-I per cardiologist.   COVID-19 virus infection 05/05/2021   paxlovid    HIV infection (HCC) Dx 02/2002   ID clinic w/ [facility].   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 Timmy  following.   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   Past Surgical History:  Procedure Laterality Date   biopsy lymph node groin  2022   BYPASS GRAFT  2004   CARDIOVASCULAR STRESS TEST  05/2016   No ischemia   COLONOSCOPY  01/2011   Normal (recall 10 yrs)   CORONARY ARTERY BYPASS GRAFT  2004   HERNIA REPAIR     as infant   INGUINAL HERNIA REPAIR Right 04/09/2013   Procedure: RIGHT HERNIA REPAIR INGUINAL ADULT;  Surgeon: Sherlean JINNY Laughter, MD;  Location: [facility];  Service: General;  Laterality: Right;   LYMPH NODE BIOPSY Left 06/16/2021   Procedure: EXCISIONAL BIOPSY LEFT INGUINAL LYMPH NODE;  Surgeon: Vernetta Berg, MD;  Location: [facility];  Service: General;  Laterality: Left;   TONSILLECTOMY     TRANSESOPHAGEAL ECHOCARDIOGRAM  12/2020   normal (mild av thickening)   TRANSTHORACIC ECHOCARDIOGRAM  03/19/2017   Normal   Family History  Problem Relation Age of Onset   Stroke Mother 59   Heart failure Father    Heart disease Father    Colon cancer Father 74   Cancer Brother 39       lymphoma   Colon polyps Neg Hx    Esophageal cancer Neg Hx    Rectal cancer Neg Hx    Stomach cancer Neg Hx    Social History   Socioeconomic History   Marital status: Single    Spouse name: Not on file   Number of children: Not on file   Years of education: Not on file   Highest education level: Not on file  Occupational History   Occupation: RETIRED  Tobacco Use   Smoking status: Former    Current packs/day: 0.00    Types: Cigarettes    Start date: 12/11/1968    Quit date: 12/12/2003    Years since quitting: 20.5    Smokeless tobacco: Never  Vaping Use   Vaping status: Never Used  Substance and Sexual Activity   Alcohol use: Never    Comment: 2005   Drug use: No    Comment: past history of crystal meth addiction -clean since 2005    Sexual activity: Not Currently    Comment: 2005  Other Topics Concern   Not on file  Social History Narrative   Homosexual.   Never married.   Hx of methamph abuse; no IV drug use.  [place] remission drug program, no use since 2005.   Exercises regularly, says he eats a good diet.   Tob: quit 2004, 50 pack-yr hx.   No alcohol.      Lives alone/2025   Social Drivers of Health   Financial Resource Strain: Low  Risk  (06/27/2024)   Overall Financial Resource Strain (CARDIA)    Difficulty of Paying Living Expenses: Not hard at all  Food Insecurity: No Food Insecurity (06/27/2024)   Hunger Vital Sign    Worried About Running Out of Food in the Last Year: Never true    Ran Out of Food in the Last Year: Never true  Transportation Needs: No Transportation Needs (06/27/2024)   PRAPARE - Administrator, Civil Service (Medical): No    Lack of Transportation (Non-Medical): No  Physical Activity: Sufficiently Active (06/27/2024)   Exercise Vital Sign    Days of Exercise per Week: 5 days    Minutes of Exercise per Session: 60 min  Stress: No Stress Concern Present (06/27/2024)   [employer] of Occupational Health - Occupational Stress Questionnaire    Feeling of Stress: Not at all  Social Connections: Unknown (06/27/2024)   Social Connection and Isolation Panel    Frequency of Communication with Friends and Family: More than three times a week    Frequency of Social Gatherings with Friends and Family: More than three times a week    Attends Religious Services: Not on Marketing executive or Organizations: Yes    Attends Banker Meetings: 1 to 4 times per year    Marital Status: Never married    Tobacco Counseling Counseling  given: Not Answered    Clinical Intake:  Pre-visit preparation completed: Yes  Pain : No/denies pain     BMI - recorded: 21.38 Nutritional Status: BMI of 19-24  Normal Nutritional Risks: None Diabetes: No  Lab Results  Component Value Date   HGBA1C 6.0 11/19/2023     How often do you need to have someone help you when you read instructions, pamphlets, or other written materials from your doctor or pharmacy?: 1 - Never  Interpreter Needed?: No  Information entered by :: Leyli Kevorkian, RMA   Activities of Daily Living     06/27/2024    3:10 PM  In your present state of health, do you have any difficulty performing the following activities:  Hearing? 0  Vision? 0  Difficulty concentrating or making decisions? 0  Walking or climbing stairs? 0  Dressing or bathing? 0  Doing errands, shopping? 0  Preparing Food and eating ? N  Using the Toilet? N  In the past six months, have you accidently leaked urine? N  Do you have problems with loss of bowel control? N  Managing your Medications? N  Managing your Finances? N  Housekeeping or managing your Housekeeping? N    Patient Care Team: Candise Aleene DEL, MD as PCP - General (Family Medicine) Jeffrie Oneil BROCKS, MD as PCP - Cardiology (Cardiology) Ivin Kocher, MD as Consulting Physician (Dermatology) Timmy Maude SAUNDERS, MD as Consulting Physician (Oncology) Brinda Elsie BRAVO, OD (Optometry) Jeffrie Oneil BROCKS, MD as Consulting Physician (Cardiology) Carolee Sherwood JONETTA DOUGLAS, MD as Consulting Physician (Urology)  I have updated your Care Teams any recent Medical Services you may have received from other providers in the past year.     Assessment:   This is a routine wellness examination for Edwin Padilla.  Hearing/Vision screen Hearing Screening - Comments:: Denies hearing difficulties   Vision Screening - Comments:: Wears eyeglasses/[facility] ([place])   Goals Addressed   None    Depression Screen     06/27/2024    3:22  PM 05/29/2024   10:25 AM 11/29/2023   10:50 AM 08/29/2023  8:40 AM 06/13/2023    9:32 AM 09/05/2022    8:35 AM 05/31/2022   10:52 AM  PHQ 2/9 Scores  PHQ - 2 Score 0 0 0 1 2 0 0  PHQ- 9 Score 1    3      Fall Risk     06/27/2024    3:19 PM 05/29/2024   10:25 AM 11/29/2023   10:50 AM 08/29/2023    8:40 AM 06/13/2023    9:35 AM  Fall Risk   Falls in the past year? 1 1 0 0 0  Number falls in past yr: 0 0   0  Injury with Fall? 0 1   0  Comment X-rays      Risk for fall due to : Impaired balance/gait Impaired balance/gait;Impaired vision  No Fall Risks No Fall Risks  Follow up Falls evaluation completed;Falls prevention discussed Falls evaluation completed Falls evaluation completed Falls evaluation completed Falls prevention discussed;Falls evaluation completed    MEDICARE RISK AT HOME:  Medicare Risk at Home Any stairs in or around the home?: Yes (outside) If so, are there any without handrails?: No Home free of loose throw rugs in walkways, pet beds, electrical cords, etc?: Yes Adequate lighting in your home to reduce risk of falls?: Yes Life alert?: No Use of a cane, walker or w/c?: No Grab bars in the bathroom?: No Shower chair or bench in shower?: No Elevated toilet seat or a handicapped toilet?: No  TIMED UP AND GO:  Was the test performed?  No  Cognitive Function: Declined/Normal: No cognitive concerns noted by patient or family. Patient alert, oriented, able to answer questions appropriately and recall recent events. No signs of memory loss or confusion.        06/13/2023    9:37 AM 05/31/2022   10:55 AM  6CIT Screen  What Year? 0 points 0 points  What month? 0 points 0 points  What time? 0 points 0 points  Count back from 20 0 points 0 points  Months in reverse 0 points 0 points  Repeat phrase 0 points 0 points  Total Score 0 points 0 points    Immunizations Immunization History  Administered Date(s) Administered   Fluad Quad(high Dose 65+) 08/13/2019,  08/26/2020, 08/25/2021, 09/05/2022   Fluad Trivalent(High Dose 65+) 08/29/2023   Hepatitis A 01/29/2013   Hepatitis A, Adult 08/20/2013   Influenza Split 09/04/2012   Influenza,inj,Quad PF,6+ Mos 09/27/2017   Influenza-Unspecified 09/18/2013, 09/22/2015, 10/10/2018   PFIZER Comirnaty(Gray Top)Covid-19 Tri-Sucrose Vaccine 04/21/2021   PFIZER(Purple Top)SARS-COV-2 Vaccination 01/17/2020, 02/11/2020, 09/17/2020   PPD Test 08/11/2009, 08/09/2011   Pfizer Covid-19 Vaccine Bivalent Booster 86yrs & up 09/05/2021, 04/19/2022   Pfizer(Comirnaty)Fall Seasonal Vaccine 12 years and older 09/29/2022, 05/02/2024   Pneumococcal Conjugate-13 03/15/2015   Pneumococcal Polysaccharide-23 08/03/2011, 02/20/2017   Tdap 09/07/2010   Unspecified SARS-COV-2 Vaccination 07/11/2023   Zoster Recombinant(Shingrix) 09/12/2017, 09/16/2017   Zoster, Live 09/14/2014    Screening Tests Health Maintenance  Topic Date Due   Hepatitis B Vaccines (1 of 3 - Risk 3-dose series) Never done   Zoster Vaccines- Shingrix (2 of 2) 11/11/2017   DTaP/Tdap/Td (2 - Td or Tdap) 09/07/2020   Colonoscopy  01/19/2021   Medicare Annual Wellness (AWV)  06/12/2024   INFLUENZA VACCINE  07/11/2024   COVID-19 Vaccine (10 - Pfizer risk 2024-25 season) 11/02/2024   Pneumococcal Vaccine: 50+ Years  Completed   Hepatitis C Screening  Completed   HPV VACCINES  Aged Out   Meningococcal B  Vaccine  Aged Out    Health Maintenance  Health Maintenance Due  Topic Date Due   Hepatitis B Vaccines (1 of 3 - Risk 3-dose series) Never done   Zoster Vaccines- Shingrix (2 of 2) 11/11/2017   DTaP/Tdap/Td (2 - Td or Tdap) 09/07/2020   Colonoscopy  01/19/2021   Medicare Annual Wellness (AWV)  06/12/2024   Health Maintenance Items Addressed: Referral sent to GI for colonoscopy  Additional Screening:  Vision Screening: Recommended annual ophthalmology exams for early detection of glaucoma and other disorders of the eye. Would you like a referral  to an eye doctor? No    Dental Screening: Recommended annual dental exams for proper oral hygiene  Community Resource Referral / Chronic Care Management: CRR required this visit?  No   CCM required this visit?  No   Plan:    I have personally reviewed and noted the following in the patient's chart:   Medical and social history Use of alcohol, tobacco or illicit drugs  Current medications and supplements including opioid prescriptions. Patient is not currently taking opioid prescriptions. Functional ability and status Nutritional status Physical activity Advanced directives List of other physicians Hospitalizations, surgeries, and ER visits in previous 12 months Vitals Screenings to include cognitive, depression, and falls Referrals and appointments  In addition, I have reviewed and discussed with patient certain preventive protocols, quality metrics, and best practice recommendations. A written personalized care plan for preventive services as well as general preventive health recommendations were provided to patient.   Edwin Padilla, CMA   06/27/2024   After Visit Summary: (MyChart) Due to this being a telephonic visit, the after visit summary with patients personalized plan was offered to patient via MyChart   Notes: Patient is due for a colonoscopy and the order has been placed today.  He stated that he has had a 2nd shingles vaccine and also a Tdap, however they have not been recorded in the [facility].   Patient is also due for a Hep b vaccine but would like to discuss further with provider during his next office visit.

## 2024-07-23 NOTE — Telephone Encounter (Signed)
 Please advise if okay to place cologuard order

## 2024-07-24 NOTE — Telephone Encounter (Signed)
 OK for cologuard for colon cancer screening

## 2024-07-25 ENCOUNTER — Other Ambulatory Visit: Payer: Self-pay

## 2024-07-25 DIAGNOSIS — Z1211 Encounter for screening for malignant neoplasm of colon: Secondary | ICD-10-CM

## 2024-07-25 NOTE — Telephone Encounter (Signed)
 Cologuard ordered

## 2024-07-28 ENCOUNTER — Encounter: Payer: Self-pay | Admitting: Family Medicine

## 2024-07-28 ENCOUNTER — Ambulatory Visit (INDEPENDENT_AMBULATORY_CARE_PROVIDER_SITE_OTHER): Admitting: Family Medicine

## 2024-07-28 VITALS — BP 112/70 | Ht 70.0 in | Wt 149.0 lb

## 2024-07-28 DIAGNOSIS — M25532 Pain in left wrist: Secondary | ICD-10-CM

## 2024-07-28 DIAGNOSIS — M79605 Pain in left leg: Secondary | ICD-10-CM

## 2024-07-28 NOTE — Progress Notes (Signed)
 PCP: Candise Aleene DEL, MD  Subjective:   HPI: Patient is a 73 y.o. male here for left leg pain.  7/2: Patient is an avid runner. Runs about 3 miles 4-5 times a week. About 2-3 months ago when he injured his back and felt increase in left shin pain at same time. Had x-rays of his back. No acute injury or trauma. He recalls adding an extra mile to his runs at that time though. No swelling/bruising. Also reports FOOSH injury to his left wrist 3 weeks ago with pain on radial side - still hurts in this area  8/18: Patient reports he feels completely improved. Maybe some soreness occasionally if left wrist. No leg pain. He is swimming 838m at a time in the pool, walking as well.  Past Medical History:  Diagnosis Date   Allergy    Cat allergy   Bilateral primary osteoarthritis of knee    Coronary atherosclerosis of unspecified type of vessel, native or graft    CABG 07/2003; annual cardiology f/u (Dr. Esmeralda Sharps).  Stable as of 02/2017 f/u--echo 03/2017 showed normal LV fxn---ok to d/c ACE-I per cardiologist.   COVID-19 virus infection 05/05/2021   paxlovid    HIV infection (HCC) Dx 02/2002   ID clinic w/ [facility].   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 Timmy following.   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    Current Outpatient Medications on File Prior to Visit  Medication Sig Dispense Refill   albuterol  (VENTOLIN  HFA) 108 (90  Base) MCG/ACT inhaler Inhale 1-2 puffs into the lungs every 6 (six) hours as needed for wheezing or shortness of breath. 1 each 0   aspirin 81 MG chewable tablet Chew 81 mg by mouth daily.     atorvastatin  (LIPITOR) 40 MG tablet TAKE 1 TABLET EVERY DAY 90 tablet 3   BIKTARVY  50-200-25 MG TABS tablet TAKE 1 TABLET BY MOUTH DAILY 30 tablet 5   losartan  (COZAAR ) 25 MG tablet TAKE 1 TABLET EVERY DAY 90 tablet 3   tamsulosin  (FLOMAX ) 0.4 MG CAPS capsule Take 1 capsule (0.4 mg total) by mouth daily. (Patient not taking: Reported on 06/27/2024) 30 capsule 1   No current facility-administered medications on file prior to visit.    Past Surgical History:  Procedure Laterality Date   biopsy lymph node groin  2022   BYPASS GRAFT  2004   CARDIOVASCULAR STRESS TEST  05/2016   No ischemia   COLONOSCOPY  01/2011   Normal (recall 10 yrs)   CORONARY ARTERY BYPASS GRAFT  2004   HERNIA REPAIR     as infant   INGUINAL HERNIA REPAIR Right 04/09/2013   Procedure: RIGHT HERNIA REPAIR INGUINAL ADULT;  Surgeon: Sherlean JINNY Laughter, MD;  Location: [facility];  Service: General;  Laterality: Right;   LYMPH NODE BIOPSY Left 06/16/2021   Procedure: EXCISIONAL BIOPSY LEFT INGUINAL LYMPH NODE;  Surgeon: Vernetta Berg, MD;  Location: [facility];  Service: General;  Laterality: Left;   TONSILLECTOMY     TRANSESOPHAGEAL ECHOCARDIOGRAM  12/2020   normal (mild av thickening)   TRANSTHORACIC ECHOCARDIOGRAM  03/19/2017   Normal    Allergies  Allergen Reactions   Cat Dander Shortness Of Breath   Lisinopril  Cough    cough    BP 112/70   Ht 5' 10 (1.778 m)   Wt 149 lb (67.6 kg)   BMI 21.38 kg/m       No data to display              No data to display              Objective:  Physical Exam:  Gen: NAD, comfortable in exam room  Left wrist: No deformity. FROM with 5/5 strength. No tenderness to palpation. NVI distally.  Assessment & Plan:  1. Left leg pain  - 2/2 peroneal strain, atypical shin splints.  Resolved.  Otc medications if needed.  2. Left wrist pain - over 2 months out now and pain resolved.  Exam reassuring.  Follow up as needed.

## 2024-07-31 DIAGNOSIS — R972 Elevated prostate specific antigen [PSA]: Secondary | ICD-10-CM | POA: Diagnosis not present

## 2024-07-31 DIAGNOSIS — Z1211 Encounter for screening for malignant neoplasm of colon: Secondary | ICD-10-CM | POA: Diagnosis not present

## 2024-08-05 ENCOUNTER — Other Ambulatory Visit: Payer: Self-pay

## 2024-08-05 ENCOUNTER — Ambulatory Visit: Payer: Self-pay | Admitting: Family Medicine

## 2024-08-05 DIAGNOSIS — Z79899 Other long term (current) drug therapy: Secondary | ICD-10-CM

## 2024-08-05 DIAGNOSIS — Z113 Encounter for screening for infections with a predominantly sexual mode of transmission: Secondary | ICD-10-CM

## 2024-08-05 DIAGNOSIS — Z21 Asymptomatic human immunodeficiency virus [HIV] infection status: Secondary | ICD-10-CM

## 2024-08-05 LAB — COLOGUARD: COLOGUARD: NEGATIVE

## 2024-08-07 DIAGNOSIS — N401 Enlarged prostate with lower urinary tract symptoms: Secondary | ICD-10-CM | POA: Diagnosis not present

## 2024-08-07 DIAGNOSIS — R3912 Poor urinary stream: Secondary | ICD-10-CM | POA: Diagnosis not present

## 2024-08-07 DIAGNOSIS — R972 Elevated prostate specific antigen [PSA]: Secondary | ICD-10-CM | POA: Diagnosis not present

## 2024-08-13 ENCOUNTER — Other Ambulatory Visit: Payer: Self-pay

## 2024-08-13 ENCOUNTER — Other Ambulatory Visit

## 2024-08-13 ENCOUNTER — Other Ambulatory Visit (HOSPITAL_COMMUNITY)
Admission: RE | Admit: 2024-08-13 | Discharge: 2024-08-13 | Disposition: A | Discharge status: Home / Self Care | Source: Ambulatory Visit | Attending: Internal Medicine | Admitting: Internal Medicine

## 2024-08-13 DIAGNOSIS — Z21 Asymptomatic human immunodeficiency virus [HIV] infection status: Secondary | ICD-10-CM | POA: Diagnosis not present

## 2024-08-13 DIAGNOSIS — Z113 Encounter for screening for infections with a predominantly sexual mode of transmission: Secondary | ICD-10-CM

## 2024-08-13 DIAGNOSIS — Z79899 Other long term (current) drug therapy: Secondary | ICD-10-CM | POA: Diagnosis not present

## 2024-08-14 LAB — URINE CYTOLOGY ANCILLARY ONLY
Chlamydia: NEGATIVE
Comment: NEGATIVE
Comment: NORMAL
Neisseria Gonorrhea: NEGATIVE

## 2024-08-15 LAB — HIV-1 RNA QUANT-NO REFLEX-BLD
HIV 1 RNA Quant: NOT DETECTED {copies}/mL
HIV-1 RNA Quant, Log: NOT DETECTED {Log_copies}/mL

## 2024-08-15 LAB — COMPLETE METABOLIC PANEL WITHOUT GFR
AG Ratio: 1.1 (calc) (ref 1.0–2.5)
ALT: 22 U/L (ref 9–46)
AST: 22 U/L (ref 10–35)
Albumin: 4 g/dL (ref 3.6–5.1)
Alkaline phosphatase (APISO): 50 U/L (ref 35–144)
BUN: 23 mg/dL (ref 7–25)
CO2: 26 mmol/L (ref 20–32)
Calcium: 9.7 mg/dL (ref 8.6–10.3)
Chloride: 104 mmol/L (ref 98–110)
Creat: 1.22 mg/dL (ref 0.70–1.28)
Globulin: 3.5 g/dL (ref 1.9–3.7)
Glucose, Bld: 122 mg/dL — ABNORMAL HIGH (ref 65–99)
Potassium: 4.7 mmol/L (ref 3.5–5.3)
Sodium: 138 mmol/L (ref 135–146)
Total Bilirubin: 0.5 mg/dL (ref 0.2–1.2)
Total Protein: 7.5 g/dL (ref 6.1–8.1)

## 2024-08-15 LAB — CBC WITH DIFFERENTIAL/PLATELET
Absolute Lymphocytes: 1596 {cells}/uL (ref 850–3900)
Absolute Monocytes: 522 {cells}/uL (ref 200–950)
Basophils Absolute: 18 {cells}/uL (ref 0–200)
Basophils Relative: 0.3 %
Eosinophils Absolute: 228 {cells}/uL (ref 15–500)
Eosinophils Relative: 3.8 %
HCT: 43.4 % (ref 38.5–50.0)
Hemoglobin: 14.2 g/dL (ref 13.2–17.1)
MCH: 30.6 pg (ref 27.0–33.0)
MCHC: 32.7 g/dL (ref 32.0–36.0)
MCV: 93.5 fL (ref 80.0–100.0)
MPV: 10.4 fL (ref 7.5–12.5)
Monocytes Relative: 8.7 %
Neutro Abs: 3636 {cells}/uL (ref 1500–7800)
Neutrophils Relative %: 60.6 %
Platelets: 287 Thousand/uL (ref 140–400)
RBC: 4.64 Million/uL (ref 4.20–5.80)
RDW: 13.6 % (ref 11.0–15.0)
Total Lymphocyte: 26.6 %
WBC: 6 Thousand/uL (ref 3.8–10.8)

## 2024-08-15 LAB — RPR: RPR Ser Ql: NONREACTIVE

## 2024-08-15 LAB — T-HELPER CELL (CD4) - (RCID CLINIC ONLY)
CD4 % Helper T Cell: 47 % (ref 33–65)
CD4 T Cell Abs: 772 /uL (ref 400–1790)

## 2024-08-25 DIAGNOSIS — N3020 Other chronic cystitis without hematuria: Secondary | ICD-10-CM | POA: Diagnosis not present

## 2024-08-25 DIAGNOSIS — R300 Dysuria: Secondary | ICD-10-CM | POA: Diagnosis not present

## 2024-08-25 DIAGNOSIS — R3912 Poor urinary stream: Secondary | ICD-10-CM | POA: Diagnosis not present

## 2024-08-25 DIAGNOSIS — R8271 Bacteriuria: Secondary | ICD-10-CM | POA: Diagnosis not present

## 2024-08-25 DIAGNOSIS — N401 Enlarged prostate with lower urinary tract symptoms: Secondary | ICD-10-CM | POA: Diagnosis not present

## 2024-08-27 ENCOUNTER — Other Ambulatory Visit: Payer: Self-pay

## 2024-08-27 ENCOUNTER — Encounter: Payer: Self-pay | Admitting: Internal Medicine

## 2024-08-27 ENCOUNTER — Ambulatory Visit: Payer: Self-pay | Admitting: Internal Medicine

## 2024-08-27 VITALS — BP 113/76 | HR 58 | Temp 97.5°F | Resp 16 | Wt 152.0 lb

## 2024-08-27 DIAGNOSIS — Z113 Encounter for screening for infections with a predominantly sexual mode of transmission: Secondary | ICD-10-CM

## 2024-08-27 DIAGNOSIS — B20 Human immunodeficiency virus [HIV] disease: Secondary | ICD-10-CM | POA: Diagnosis not present

## 2024-08-27 DIAGNOSIS — Z23 Encounter for immunization: Secondary | ICD-10-CM

## 2024-08-27 MED ORDER — BICTEGRAVIR-EMTRICITAB-TENOFOV 50-200-25 MG PO TABS: 1.0000 | ORAL_TABLET | Freq: Every day | ORAL | 11 refills | Status: AC

## 2024-08-27 NOTE — Progress Notes (Signed)
   Subjective:    Patient ID: Edwin Padilla, male    DOB: February 18, 1951, 73 y.o.   MRN: [id number]  HPI Edwin Padilla is here for follow up of HIV He continues on Biktarvy  with no missed doses.  Here for his yearly follow up.  No issues with getting or taking his medication.     08/27/24 id clinic f/u See a&p    Review of Systems  Constitutional:  Negative for fatigue.  Gastrointestinal:  Negative for diarrhea and nausea.  Skin:  Negative for rash.       Objective:   Physical Exam Eyes:     General: No scleral icterus. Pulmonary:     Effort: Pulmonary effort is normal.  Neurological:     Mental Status: He is alert.   Labs: Lab Results  Component Value Date   WBC 6.0 08/13/2024   HGB 14.2 08/13/2024   HCT 43.4 08/13/2024   MCV 93.5 08/13/2024   PLT 287 08/13/2024   Last metabolic panel Lab Results  Component Value Date   GLUCOSE 122 (H) 08/13/2024   NA 138 08/13/2024   K 4.7 08/13/2024   CL 104 08/13/2024   CO2 26 08/13/2024   BUN 23 08/13/2024   CREATININE 1.22 08/13/2024   GFRNONAA >60 04/30/2024   CALCIUM  9.7 08/13/2024   PROT 7.5 08/13/2024   ALBUMIN 4.3 04/30/2024   LABGLOB 3.9 04/30/2024   AGRATIO 0.9 04/30/2024   BILITOT 0.5 08/13/2024   ALKPHOS 49 04/30/2024   AST 22 08/13/2024   ALT 22 08/13/2024   ANIONGAP 7 04/30/2024   HIV: Lab Results  Component Value Date   HIV1RNAQUANT NOT DETECTED 08/13/2024   Lab Results  Component Value Date   CD4TCELL 47 08/13/2024   CD4TABS 772 08/13/2024           Assessment & Plan:   #hiv  Compliant with biktarvy ; doing great  -discussed u=u -encourage compliance -continue current HIV medication -labs 9-12 months -f/u in 9-12 months    #social -live alone -not in relationship/sexually active as of 08/27/24 -retired  -very busy exercise, Clinical research associate, travel -no substance use   #lymphoplasmacytic lymphoma Follows dr Frank Assymptomatic, not on chemo   #bph #?frequent uti Follows  urology   #hcm -vaccination Flu shot 08/27/24 Tdap is due but he doesn't like to do 2 in one visit Covid shot -- got one in late may and asked which I said reasonable to repeat every 6-12 months -hepatitis 2014 hep b sAb positive (neg sAg/cAb) -metabolic/reprieve He is taking lipitor 40 -std screening 08/2024 rpr and urine gc/chlam negative -cancer screening

## 2024-08-27 NOTE — Patient Instructions (Signed)
 You are doing great  Continue biktarvy    See me in 12 months and we can do labs after visit if you prefer   Please review anal cancer screening in HIV and let me know if you are interested

## 2024-09-21 ENCOUNTER — Encounter: Payer: Self-pay | Admitting: Family Medicine

## 2024-09-22 ENCOUNTER — Other Ambulatory Visit: Payer: Self-pay

## 2024-09-22 ENCOUNTER — Inpatient Hospital Stay: Admitting: Hematology & Oncology

## 2024-09-22 ENCOUNTER — Inpatient Hospital Stay: Attending: Hematology & Oncology

## 2024-09-22 ENCOUNTER — Encounter: Payer: Self-pay | Admitting: Hematology & Oncology

## 2024-09-22 VITALS — BP 110/76 | HR 45 | Temp 97.8°F | Resp 17 | Ht 70.0 in | Wt 153.0 lb

## 2024-09-22 DIAGNOSIS — D472 Monoclonal gammopathy: Secondary | ICD-10-CM | POA: Diagnosis not present

## 2024-09-22 DIAGNOSIS — Z23 Encounter for immunization: Secondary | ICD-10-CM | POA: Diagnosis not present

## 2024-09-22 DIAGNOSIS — C8800 Waldenstrom macroglobulinemia not having achieved remission: Secondary | ICD-10-CM | POA: Diagnosis not present

## 2024-09-22 DIAGNOSIS — R590 Localized enlarged lymph nodes: Secondary | ICD-10-CM

## 2024-09-22 LAB — CBC WITH DIFFERENTIAL (CANCER CENTER ONLY)
Abs Immature Granulocytes: 0.02 K/uL (ref 0.00–0.07)
Basophils Absolute: 0 K/uL (ref 0.0–0.1)
Basophils Relative: 0 %
Eosinophils Absolute: 0.3 K/uL (ref 0.0–0.5)
Eosinophils Relative: 5 %
HCT: 42.7 % (ref 39.0–52.0)
Hemoglobin: 14.6 g/dL (ref 13.0–17.0)
Immature Granulocytes: 0 %
Lymphocytes Relative: 32 %
Lymphs Abs: 2 K/uL (ref 0.7–4.0)
MCH: 31 pg (ref 26.0–34.0)
MCHC: 34.2 g/dL (ref 30.0–36.0)
MCV: 90.7 fL (ref 80.0–100.0)
Monocytes Absolute: 0.7 K/uL (ref 0.1–1.0)
Monocytes Relative: 11 %
Neutro Abs: 3.2 K/uL (ref 1.7–7.7)
Neutrophils Relative %: 52 %
Platelet Count: 257 K/uL (ref 150–400)
RBC: 4.71 MIL/uL (ref 4.22–5.81)
RDW: 14.2 % (ref 11.5–15.5)
WBC Count: 6.2 K/uL (ref 4.0–10.5)
nRBC: 0 % (ref 0.0–0.2)

## 2024-09-22 LAB — CMP (CANCER CENTER ONLY)
ALT: 34 U/L (ref 0–44)
AST: 30 U/L (ref 15–41)
Albumin: 4.4 g/dL (ref 3.5–5.0)
Alkaline Phosphatase: 54 U/L (ref 38–126)
Anion gap: 11 (ref 5–15)
BUN: 18 mg/dL (ref 8–23)
CO2: 26 mmol/L (ref 22–32)
Calcium: 9.8 mg/dL (ref 8.9–10.3)
Chloride: 102 mmol/L (ref 98–111)
Creatinine: 1.27 mg/dL — ABNORMAL HIGH (ref 0.61–1.24)
GFR, Estimated: 60 mL/min — ABNORMAL LOW (ref >60)
Glucose, Bld: 93 mg/dL (ref 70–99)
Potassium: 4.6 mmol/L (ref 3.5–5.1)
Sodium: 139 mmol/L (ref 135–145)
Total Bilirubin: 0.9 mg/dL (ref 0.0–1.2)
Total Protein: 8.5 g/dL — ABNORMAL HIGH (ref 6.5–8.1)

## 2024-09-22 LAB — LACTATE DEHYDROGENASE: LDH: 159 U/L (ref 98–192)

## 2024-09-22 MED ORDER — ALBUTEROL SULFATE HFA 108 (90 BASE) MCG/ACT IN AERS: 1.0000 | INHALATION_SPRAY | Freq: Four times a day (QID) | RESPIRATORY_TRACT | 0 refills | Status: DC | PRN

## 2024-09-22 NOTE — Progress Notes (Signed)
 Hematology and Oncology Follow Up Visit  Edwin Padilla [id number] August 21, 1951 73 y.o. 09/22/2024   Principle Diagnosis:  IgM smoldering lymphoplasmacytic lymphoma  Current Therapy:   Observation     Interim History:  Edwin Padilla is back for follow-up.  Unfortunately, he did not go over to [place] this year.  He is post to but has some problems with some injuries from running.  He has solved that now with alternating running and swimming.  In 2 weeks, he will be headed over to [place].  He will be going to [place].  I am sure that he will have a wonderful time over there.  Bev he feels well.  He has had no problems at all.  Has had no fever.  Has had no rashes.  Has had no problems with bowels or bladder.  He has had no leg swelling.  His monoclonal studies when we last saw him showed a monoclonal spike of 1.2 g/dL.  His IgM level was 3500 mg/dL.  The kappa light chain was 23.8 mg/dL.  Again he is still asymptomatic.SABRA  He was having some urinary issues.  He saw the Urologist and was put on a alpha-blocker which helped.  Overall, I would say that his performance status is probably ECOG 0.    Medications:  Current Outpatient Medications:    silodosin (RAPAFLO) 4 MG CAPS capsule, Take 4 mg by mouth daily., Disp: , Rfl:    albuterol  (VENTOLIN  HFA) 108 (90 Base) MCG/ACT inhaler, Inhale 1-2 puffs into the lungs every 6 (six) hours as needed for wheezing or shortness of breath., Disp: 1 each, Rfl: 0   aspirin 81 MG chewable tablet, Chew 81 mg by mouth daily., Disp: , Rfl:    atorvastatin  (LIPITOR) 40 MG tablet, TAKE 1 TABLET EVERY DAY, Disp: 90 tablet, Rfl: 3   bictegravir-emtricitabine-tenofovir  AF (BIKTARVY ) 50-200-25 MG TABS tablet, Take 1 tablet by mouth daily., Disp: 30 tablet, Rfl: 11   losartan  (COZAAR ) 25 MG tablet, TAKE 1 TABLET EVERY DAY, Disp: 90 tablet, Rfl: 3  Allergies:  Allergies  Allergen Reactions   Cat Dander Shortness Of Breath   Lisinopril  Cough    cough    Past  Medical History, Surgical history, Social history, and Family History were reviewed and updated.  Review of Systems: Review of Systems  Constitutional: Negative.   HENT:  Negative.    Eyes: Negative.   Respiratory: Negative.    Cardiovascular: Negative.   Gastrointestinal: Negative.   Endocrine: Negative.   Genitourinary: Negative.    Musculoskeletal: Negative.   Skin: Negative.   Neurological: Negative.   Hematological: Negative.   Psychiatric/Behavioral: Negative.      Physical Exam: Vital signs show temperature 97.8.  Pulse 45.  Blood pressure 110/76.  Weight is 153 pounds.   Wt Readings from Last 3 Encounters:  09/22/24 153 lb (69.4 kg)  08/27/24 152 lb (68.9 kg)  07/28/24 149 lb (67.6 kg)    Physical Exam Vitals reviewed.  HENT:     Head: Normocephalic and atraumatic.  Eyes:     Pupils: Pupils are equal, round, and reactive to light.  Cardiovascular:     Rate and Rhythm: Normal rate and regular rhythm.     Heart sounds: Normal heart sounds.  Pulmonary:     Effort: Pulmonary effort is normal.     Breath sounds: Normal breath sounds.  Abdominal:     General: Bowel sounds are normal.     Palpations: Abdomen is soft.  Musculoskeletal:  General: No tenderness or deformity. Normal range of motion.     Cervical back: Normal range of motion.  Lymphadenopathy:     Cervical: No cervical adenopathy.  Skin:    General: Skin is warm and dry.     Findings: No erythema or rash.  Neurological:     Mental Status: He is alert and oriented to person, place, and time.  Psychiatric:        Behavior: Behavior normal.        Thought Content: Thought content normal.        Judgment: Judgment normal.      Lab Results  Component Value Date   WBC 6.2 09/22/2024   HGB 14.6 09/22/2024   HCT 42.7 09/22/2024   MCV 90.7 09/22/2024   PLT 257 09/22/2024     Chemistry      Component Value Date/Time   NA 138 08/13/2024 0901   K 4.7 08/13/2024 0901   CL 104 08/13/2024  0901   CO2 26 08/13/2024 0901   BUN 23 08/13/2024 0901   CREATININE 1.22 08/13/2024 0901      Component Value Date/Time   CALCIUM  9.7 08/13/2024 0901   ALKPHOS 49 04/30/2024 1011   AST 22 08/13/2024 0901   AST 16 04/30/2024 1011   ALT 22 08/13/2024 0901   ALT 18 04/30/2024 1011   BILITOT 0.5 08/13/2024 0901   BILITOT 0.6 04/30/2024 1011     Impression and Plan: Edwin Padilla is a very nice 73 year old white male.  He has a lymphoplasmacytic lymphoma.  So far, has been totally asymptomatic.  Even though the protein numbers have been trending upward slowly, I still think we have the luxury of being able to hold off on doing anything for this.  I have no problem with him going over to [place].  I know he will have a wonderful time over there.  We will plan to get him back now after the Holiday season.  At some point, we certainly may have to treat him.  For right now, I do not see that we will have to treat as I do not see any benefit to him.  Maude JONELLE Crease, MD 10/13/202510:04 AM

## 2024-09-23 ENCOUNTER — Ambulatory Visit: Payer: Self-pay | Admitting: Hematology & Oncology

## 2024-09-23 LAB — IGG, IGA, IGM
IgA: 54 mg/dL — ABNORMAL LOW (ref 61–437)
IgG (Immunoglobin G), Serum: 494 mg/dL — ABNORMAL LOW (ref 603–1613)
IgM (Immunoglobulin M), Srm: 3301 mg/dL — ABNORMAL HIGH (ref 15–143)

## 2024-09-23 LAB — KAPPA/LAMBDA LIGHT CHAINS
Kappa free light chain: 296 mg/L — ABNORMAL HIGH (ref 3.3–19.4)
Kappa, lambda light chain ratio: 67.27 — ABNORMAL HIGH (ref 0.26–1.65)
Lambda free light chains: 4.4 mg/L — ABNORMAL LOW (ref 5.7–26.3)

## 2024-09-25 LAB — PROTEIN ELECTROPHORESIS, SERUM, WITH REFLEX
A/G Ratio: 0.9 (ref 0.7–1.7)
Albumin ELP: 3.7 g/dL (ref 2.9–4.4)
Alpha-1-Globulin: 0.3 g/dL (ref 0.0–0.4)
Alpha-2-Globulin: 0.7 g/dL (ref 0.4–1.0)
Beta Globulin: 0.8 g/dL (ref 0.7–1.3)
Gamma Globulin: 2.6 g/dL — ABNORMAL HIGH (ref 0.4–1.8)
Globulin, Total: 4.3 g/dL — ABNORMAL HIGH (ref 2.2–3.9)
M-Spike, %: 1.8 g/dL — ABNORMAL HIGH
SPEP Interpretation: 0
Total Protein ELP: 8 g/dL (ref 6.0–8.5)

## 2024-09-25 LAB — IMMUNOFIXATION REFLEX, SERUM
IgA: 54 mg/dL — ABNORMAL LOW (ref 61–437)
IgG (Immunoglobin G), Serum: 500 mg/dL — ABNORMAL LOW (ref 603–1613)
IgM (Immunoglobulin M), Srm: 2502 mg/dL — ABNORMAL HIGH (ref 15–143)

## 2024-09-30 ENCOUNTER — Inpatient Hospital Stay

## 2024-09-30 ENCOUNTER — Ambulatory Visit: Admitting: Hematology & Oncology

## 2024-10-21 ENCOUNTER — Other Ambulatory Visit: Payer: Self-pay | Admitting: Family Medicine

## 2024-10-27 DIAGNOSIS — L814 Other melanin hyperpigmentation: Secondary | ICD-10-CM | POA: Diagnosis not present

## 2024-10-27 DIAGNOSIS — L570 Actinic keratosis: Secondary | ICD-10-CM | POA: Diagnosis not present

## 2024-10-27 DIAGNOSIS — L821 Other seborrheic keratosis: Secondary | ICD-10-CM | POA: Diagnosis not present

## 2024-10-27 DIAGNOSIS — D492 Neoplasm of unspecified behavior of bone, soft tissue, and skin: Secondary | ICD-10-CM | POA: Diagnosis not present

## 2024-10-27 DIAGNOSIS — D225 Melanocytic nevi of trunk: Secondary | ICD-10-CM | POA: Diagnosis not present

## 2024-11-11 DIAGNOSIS — N401 Enlarged prostate with lower urinary tract symptoms: Secondary | ICD-10-CM | POA: Diagnosis not present

## 2024-11-19 ENCOUNTER — Ambulatory Visit: Admitting: Family Medicine

## 2024-11-19 ENCOUNTER — Encounter: Payer: Self-pay | Admitting: Family Medicine

## 2024-11-19 VITALS — BP 103/67 | HR 69 | Temp 96.5°F | Ht 70.0 in | Wt 153.2 lb

## 2024-11-19 DIAGNOSIS — I10 Essential (primary) hypertension: Secondary | ICD-10-CM | POA: Diagnosis not present

## 2024-11-19 DIAGNOSIS — Z125 Encounter for screening for malignant neoplasm of prostate: Secondary | ICD-10-CM | POA: Diagnosis not present

## 2024-11-19 DIAGNOSIS — R7303 Prediabetes: Secondary | ICD-10-CM | POA: Diagnosis not present

## 2024-11-19 DIAGNOSIS — H9193 Unspecified hearing loss, bilateral: Secondary | ICD-10-CM | POA: Diagnosis not present

## 2024-11-19 DIAGNOSIS — E7800 Pure hypercholesterolemia, unspecified: Secondary | ICD-10-CM | POA: Diagnosis not present

## 2024-11-19 LAB — POCT GLYCOSYLATED HEMOGLOBIN (HGB A1C)
HbA1c POC (<> result, manual entry): 5.4 % (ref 4.0–5.6)
HbA1c, POC (controlled diabetic range): 5.4 % (ref 0.0–7.0)
HbA1c, POC (prediabetic range): 5.4 % — AB (ref 5.7–6.4)
Hemoglobin A1C: 5.4 % (ref 4.0–5.6)

## 2024-11-19 NOTE — Progress Notes (Signed)
 OFFICE VISIT  11/19/2024  CC:  Chief Complaint  Patient presents with   Medical Management of Chronic Issues    Patient is a 73 y.o. male who presents for 68-month follow-up hypertension, prediabetes, and hyperlipidemia. A/P as of last visit: #1 acute left wrist sprain. Low suspicion of scaphoid fracture.  However, I do think it is best for him to wear a splint so I dispensed a thumb spica splint today. If still hurting significantly in the next couple of weeks then we will re repeat left wrist x-ray.   #2 hypertension, well-controlled on losartan  25 mg a day. Electrolytes and creatinine were normal 04/30/2024.   #3 hypercholesterolemia, doing well on a atorvastatin  40 mg a day. LDL was 77 about 6 months ago.  Will repeat lipid panel in 6 months if cardiology has not done so first.   4.  BPH, mild lower urinary tract obstructive symptoms. He feels like he is doing fine and chooses no medications at this time.   #5 prediabetes. Hemoglobin A1c 6.0% about 6 months ago.  Glucose was 102 on labs 04/30/2024. Plan repeat A1c 6 months.  INTERIM HX: Presents for well. A couple weeks ago he had an acute flare of midline low back pain that intermittently radiated across his abdomen and groin bilaterally.  This is essentially resolved now.  He is swimming for exercise but has not begun to run again yet.  Interestingly, he had some left shoulder pain along with his back pain but this has resolved as well.  He has had appropriate labs with his infectious disease doctor and his oncologist.   Past Medical History:  Diagnosis Date   Allergy    Cat allergy   Bilateral primary osteoarthritis of knee    Coronary atherosclerosis of unspecified type of vessel, native or graft    CABG 07/2003; annual cardiology f/u (Dr. Esmeralda Sharps).  Stable as of 02/2017 f/u--echo 03/2017 showed normal LV fxn---ok to d/c ACE-I per cardiologist.   COVID-19 virus infection 05/05/2021   paxlovid    HIV infection  (HCC) Dx 02/2002   ID clinic w/ [facility].   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 Timmy following.   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    Past Surgical History:  Procedure Laterality Date   biopsy lymph node groin  2022   BYPASS GRAFT  2004   CARDIOVASCULAR STRESS TEST  05/2016   No ischemia   COLONOSCOPY  01/2011   Normal (recall 10 yrs)   CORONARY ARTERY BYPASS GRAFT  2004   HERNIA REPAIR     as infant   INGUINAL HERNIA REPAIR Right 04/09/2013   Procedure: RIGHT HERNIA REPAIR INGUINAL ADULT;  Surgeon: Sherlean JINNY Laughter, MD;  Location: [facility];  Service: General;  Laterality: Right;   LYMPH NODE BIOPSY Left 06/16/2021   Procedure: EXCISIONAL BIOPSY LEFT INGUINAL LYMPH NODE;  Surgeon: Vernetta Berg, MD;  Location: [facility];  Service: General;  Laterality: Left;   TONSILLECTOMY     TRANSESOPHAGEAL ECHOCARDIOGRAM  12/2020   normal (mild av thickening)   TRANSTHORACIC ECHOCARDIOGRAM  03/19/2017   Normal    Outpatient Medications Prior to Visit  Medication Sig Dispense Refill   aspirin 81 MG chewable tablet Chew 81 mg by mouth daily.     atorvastatin  (LIPITOR) 40 MG tablet TAKE 1 TABLET EVERY DAY 90 tablet 3   bictegravir-emtricitabine-tenofovir  AF (BIKTARVY ) 50-200-25 MG TABS tablet Take 1 tablet by mouth daily. 30 tablet 11   losartan  (COZAAR ) 25 MG tablet TAKE 1 TABLET EVERY DAY 90  tablet 3   tamsulosin  (FLOMAX ) 0.4 MG CAPS capsule Take 0.4 mg by mouth daily.     albuterol  (VENTOLIN  HFA) 108 (90 Base) MCG/ACT inhaler INHALE 1 TO 2 PUFFS INTO THE LUNGS EVERY 6 HOURS AS NEEDED FOR WHEEZING OR SHORTNESS OF BREATH (Patient not taking: Reported on 11/19/2024) 18 g 0   silodosin (RAPAFLO) 4 MG CAPS capsule Take 4 mg by mouth daily. (Patient not taking: Reported on 11/19/2024)     No facility-administered medications prior to visit.    Allergies  Allergen Reactions   Cat Dander Shortness Of Breath   Lisinopril  Cough    cough    Review of Systems As per HPI  PE:    11/19/2024    1:43 PM 09/22/2024    9:00 AM 08/27/2024    8:41 AM  Vitals with BMI  Height 5' 10 5' 10   Weight 153 lbs 3 oz 153 lbs   BMI 21.98 21.95   Systolic 103 110 886  Diastolic 67 76 76  Pulse 69 45 58     Physical Exam  Gen: Alert, well appearing.  Patient is oriented to person, place, time, and situation. AFFECT: pleasant, lucid thought and speech. R ear with mild amount of cerumen in ear canal, not obstructing good view of TM, which is normal. L EAC clear, TM normal. No further exam today.  LABS:  Last CBC Lab Results  Component Value Date   WBC 6.2 09/22/2024   HGB 14.6 09/22/2024   HCT 42.7 09/22/2024   MCV 90.7 09/22/2024   MCH 31.0 09/22/2024   RDW 14.2 09/22/2024   PLT 257 09/22/2024   Last metabolic panel Lab Results  Component Value Date   GLUCOSE 93 09/22/2024   NA 139 09/22/2024   K 4.6 09/22/2024   CL 102 09/22/2024   CO2 26 09/22/2024   BUN 18 09/22/2024   CREATININE 1.27 (H) 09/22/2024   GFRNONAA 60 (L) 09/22/2024   CALCIUM  9.8 09/22/2024   PROT 8.5 (H) 09/22/2024   ALBUMIN 4.4 09/22/2024   LABGLOB 4.3 (H) 09/22/2024   AGRATIO 0.9 09/22/2024   BILITOT 0.9 09/22/2024   ALKPHOS 54 09/22/2024   AST 30 09/22/2024   ALT 34 09/22/2024   ANIONGAP 11 09/22/2024   Last lipids Lab Results  Component Value Date   CHOL 125 11/19/2023   HDL 31.30 (L)  11/19/2023   LDLCALC 77 11/19/2023   TRIG 83.0 11/19/2023   CHOLHDL 4 11/19/2023   Last hemoglobin A1c Lab Results  Component Value Date   HGBA1C 6.0 11/19/2023   Last thyroid  functions Lab Results  Component Value Date   TSH 1.90 11/19/2023   Lab Results  Component Value Date   PSA1 0.8 01/01/2024   PSA 4.67 (H) 11/19/2023   PSA 0.83 08/25/2021   PSA 0.38 08/25/2020   IMPRESSION AND PLAN:  #1 hypertension, well-controlled on  losartan  25 mg a day. Electrolytes and creatinine were normal approx 2 mo ago.   #3 hypercholesterolemia, doing well on a atorvastatin  40 mg a day. LDL was 77 about 1 yr ago.  He is not fasting today. Will repeat lipid panel in 6 months.   4.  BPH, mild lower urinary tract obstructive symptoms. Currently on tamsulosin  per urol. They also monitor his PSA so he does not need me to test that today.   #5 prediabetes. Hemoglobin A1c 6.0% about 1 yr ago  Glucose was 93 on labs 09/2024. POC Hba1c today is 5.4%.  #6  Recurrent acute musculoskeletal low back pain, most recent episode completely resolved.  He will get back into running slowly.  He will continue swimming--> this seems to be helping well.  #7 decreased hearing bilateral. Gradual onset and gradual worsening. He requests referral to audiology today.  An After Visit Summary was printed and given to the patient.  FOLLOW UP: No follow-ups on file.  Signed:  Gerlene Hockey, MD           11/19/2024

## 2024-11-28 ENCOUNTER — Ambulatory Visit: Admitting: Family Medicine

## 2024-12-29 ENCOUNTER — Ambulatory Visit: Admitting: Family Medicine

## 2024-12-29 VITALS — BP 117/85 | Ht 70.0 in | Wt 149.0 lb

## 2024-12-29 DIAGNOSIS — M25512 Pain in left shoulder: Secondary | ICD-10-CM | POA: Diagnosis not present

## 2024-12-29 NOTE — Progress Notes (Signed)
 "  PCP: Candise Aleene DEL, MD  Patient is a 74 y.o. male here for L shoulder pain s/p injury.  HPI - L shoulder pain started 10 days ago, may have also been slightly present since he threw out his back a couple months ago - Runs and swims regularly, took a break for a little while the Flu was going around, was feeling good and getting back to everything at the beginning of January - Reaching up and trying to wear a shirt causes pain; lateral raises with reduced weights than normal for him - Has tried Tylenol  and Ibuprofen which has helped - Mom has bursitis in the shoulder - Pain with laying on his L shoulder, sometimes wakes him up   Past Medical History:  Diagnosis Date   Allergy    Cat allergy   Bilateral primary osteoarthritis of knee    Colon cancer screening    cologuard 07/2024 NEG   Coronary atherosclerosis of unspecified type of vessel, native or graft    CABG 07/2003; annual cardiology f/u (Dr. Esmeralda Sharps).  Stable as of 02/2017 f/u--echo 03/2017 showed normal LV fxn---ok to d/c ACE-I per cardiologist.   COVID-19 virus infection 05/05/2021   paxlovid    HIV infection (HCC) Dx 02/2002   ID clinic w/ [facility].   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 Timmy following.   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    Medications Ordered Prior to Encounter[1]  Past Surgical History:  Procedure Laterality Date   biopsy lymph node groin   2022   BYPASS GRAFT  2004   CARDIOVASCULAR STRESS TEST  05/2016   No ischemia   COLONOSCOPY  01/2011   Normal (recall 10 yrs)   CORONARY ARTERY BYPASS GRAFT  2004   HERNIA REPAIR     as infant   INGUINAL HERNIA REPAIR Right 04/09/2013   Procedure: RIGHT HERNIA REPAIR INGUINAL ADULT;  Surgeon: Sherlean JINNY Laughter, MD;  Location: [facility];  Service: General;  Laterality: Right;   LYMPH NODE BIOPSY Left 06/16/2021   Procedure: EXCISIONAL BIOPSY LEFT INGUINAL LYMPH NODE;  Surgeon: Vernetta Berg, MD;  Location:  SURGERY CENTER;  Service: General;  Laterality: Left;   TONSILLECTOMY     TRANSESOPHAGEAL ECHOCARDIOGRAM  12/2020   normal (mild av thickening)   TRANSTHORACIC ECHOCARDIOGRAM  03/19/2017   Normal    Allergies[2]  There were no vitals taken for this visit.      No data to display              No data to display              Objective:  Physical Exam:  Shoulder Exam Gen: NAD, comfortable in exam room Inspection: No visible lesions, edema, erythema, overlying skin changes  Palpation: Tight trapezius on the L, compared to the R.  No TTP over [facility] joint bilaterally. ROM: Mildly decreased active flexion and abduction on the  L, limited by pain.  FROM with passive ROM.  FROM on the R. Special Tests: Negative Hawkins bilaterally.  Mildly positive Neer's on the L, negative on the R.  Positive empty can and full can on the L, negative on the R. Strength: 5-/5 strength on the L, 5/5 strength on the R with resisted full can, 5/5 internal and external rotation NVI  Assessment and Plan:   Assessment & Plan Acute pain of left shoulder Left shoulder pain for the past few weeks.  Not aggravated by swimming, running, but has mildly limited his weight lifting and difficulty with daily activities such as reaching over his head and to the side.  Differential includes L shoulder rotator cuff impingement or tear, arthritis, or bursitis.  Symptoms and  exam align with left shoulder rotator cuff impingement. - Discussed and reviewed home exercises to help with rotator cuff impingement - Advised OTC ibuprofen or topical Voltaren gel as needed to help with inflammation - Discussed mild activity modifications as needed, but advised continuing his activities if able - Follow-up in 6 weeks; if symptoms persist or worsen could consider L shoulder subacromial injection vs formal PT vs ultrasound or MRI if needed  Kathrine Melena, DO [facility]     [1]  Current Outpatient Medications on File Prior to Visit  Medication Sig Dispense Refill   albuterol  (VENTOLIN  HFA) 108 (90 Base) MCG/ACT inhaler INHALE 1 TO 2 PUFFS INTO THE LUNGS EVERY 6 HOURS AS NEEDED FOR WHEEZING OR SHORTNESS OF BREATH (Patient not taking: Reported on 11/19/2024) 18 g 0   aspirin 81 MG chewable tablet Chew 81 mg by mouth daily.     atorvastatin  (LIPITOR) 40 MG tablet TAKE 1 TABLET EVERY DAY 90 tablet 3   bictegravir-emtricitabine-tenofovir  AF (BIKTARVY ) 50-200-25 MG TABS tablet Take 1 tablet by mouth daily. 30 tablet 11   losartan  (COZAAR ) 25 MG tablet TAKE 1 TABLET EVERY DAY 90 tablet 3   tamsulosin  (FLOMAX ) 0.4 MG CAPS capsule Take 0.4 mg by mouth daily.     No current facility-administered medications on file prior to visit.  [2]  Allergies Allergen Reactions   Cat Dander Shortness Of Breath   Lisinopril  Cough    cough   "

## 2024-12-29 NOTE — Patient Instructions (Signed)
 You have rotator cuff impingement Try to avoid painful activities (overhead activities, lifting with extended arm) as much as possible if these are bothersome. Aleve 2 tabs twice a day with food for pain and inflammation as needed (use voltaren gel if this helps instead given less risk of side effects). Can take tylenol  in addition to this. Subacromial injection may be beneficial to help with pain and to decrease inflammation. Consider physical therapy with transition to home exercise program. Do home exercise program with theraband and scapular stabilization exercises daily 3 sets of 10 once a day. If not improving at follow-up we will consider further imaging, injection, physical therapy, and/or nitro patches. Follow up with me in 6 weeks.

## 2025-01-12 ENCOUNTER — Ambulatory Visit: Admitting: Hematology & Oncology

## 2025-01-12 ENCOUNTER — Inpatient Hospital Stay

## 2025-01-21 ENCOUNTER — Inpatient Hospital Stay: Admitting: Hematology & Oncology

## 2025-01-21 ENCOUNTER — Inpatient Hospital Stay

## 2025-02-11 ENCOUNTER — Ambulatory Visit: Admitting: Family Medicine

## 2025-04-01 ENCOUNTER — Ambulatory Visit: Admitting: Cardiology

## 2025-05-20 ENCOUNTER — Ambulatory Visit: Admitting: Family Medicine
